# Patient Record
Sex: Female | Born: 1985 | Race: White | Hispanic: No | Marital: Married | Smoking: Never smoker
Health system: Southern US, Community
[De-identification: ages and names within clinical notes are randomized; demographics above are authoritative.]

## PROBLEM LIST (undated history)

## (undated) DIAGNOSIS — Z3480 Encounter for supervision of other normal pregnancy, unspecified trimester: Secondary | ICD-10-CM

## (undated) DIAGNOSIS — Z803 Family history of malignant neoplasm of breast: Secondary | ICD-10-CM

## (undated) DIAGNOSIS — Z15068 Genetic susceptibility to other malignant neoplasm of digestive system: Secondary | ICD-10-CM

## (undated) DIAGNOSIS — Z1509 Genetic susceptibility to other malignant neoplasm: Secondary | ICD-10-CM

## (undated) DIAGNOSIS — Z1507 Genetic susceptibility to malignant neoplasm of urinary tract: Secondary | ICD-10-CM

## (undated) DIAGNOSIS — Z1501 Genetic susceptibility to malignant neoplasm of breast: Secondary | ICD-10-CM

## (undated) DIAGNOSIS — Z8041 Family history of malignant neoplasm of ovary: Secondary | ICD-10-CM

## (undated) HISTORY — DX: Family history of malignant neoplasm of ovary: Z80.41

## (undated) HISTORY — PX: WISDOM TOOTH EXTRACTION: SHX21

## (undated) HISTORY — DX: Family history of malignant neoplasm of breast: Z80.3

## (undated) HISTORY — DX: Genetic susceptibility to malignant neoplasm of breast: Z15.09

## (undated) HISTORY — DX: Genetic susceptibility to malignant neoplasm of urinary tract: Z15.07

## (undated) HISTORY — DX: Genetic susceptibility to other malignant neoplasm of digestive system: Z15.068

## (undated) HISTORY — DX: Encounter for supervision of other normal pregnancy, unspecified trimester: Z34.80

## (undated) HISTORY — DX: Genetic susceptibility to malignant neoplasm of breast: Z15.01

---

## 2015-07-23 ENCOUNTER — Ambulatory Visit (INDEPENDENT_AMBULATORY_CARE_PROVIDER_SITE_OTHER): Payer: BC Managed Care – PPO | Admitting: Nurse Practitioner

## 2015-07-23 ENCOUNTER — Encounter: Payer: Self-pay | Admitting: Nurse Practitioner

## 2015-07-23 VITALS — BP 126/80 | HR 88 | Ht 68.0 in | Wt 124.0 lb

## 2015-07-23 DIAGNOSIS — Z803 Family history of malignant neoplasm of breast: Secondary | ICD-10-CM

## 2015-07-23 DIAGNOSIS — Z01419 Encounter for gynecological examination (general) (routine) without abnormal findings: Secondary | ICD-10-CM

## 2015-07-23 DIAGNOSIS — Z0000 Encounter for general adult medical examination without abnormal findings: Secondary | ICD-10-CM | POA: Diagnosis not present

## 2015-07-23 NOTE — Progress Notes (Signed)
Patient ID: Dominique Cannon, female   DOB: 23-Jun-1986, 29 y.o.   MRN: [id number] 29 y.o. G0P0 Married  Caucasian Fe here for [employer] annual exam.  Former pt. here and paper is available. Mirena IUD was inserted 07/04/2011 by Dr. Sabra Heck.  Menses is monthly light to spotting at 3-4 days. Some cramps and PMS.  Married now for  3 years.  wants to consider a pregnancy in 3- 4 more years.  Wants another IUD after this one.  Patient's last menstrual period was 07/20/2015 (exact date).          Sexually active: Yes.    The current method of family planning is IUD.   Mirena inserted 07/04/11. Exercising: Yes.    yoga and walking Smoker:  no  Health Maintenance: Pap:  06/22/11, Negative  TDaP:  2011 Gardasil:  Completed 12/2011 Labs: declines  Urine: does not need   reports that she has never smoked. She has never used smokeless tobacco. She reports that she drinks about 0.6 oz of alcohol per week. She reports that she does not use illicit drugs.  Past Medical History  Diagnosis Date  . Family history of breast cancer in mother     mother age 44 died of breast cancer    Past Surgical History  Procedure Laterality Date  . Wisdom tooth extraction    . Intrauterine device insertion  07/04/11    Mirena IUD insertion by Dr. Edwinna Areola    Current Outpatient Prescriptions  Medication Sig Dispense Refill  . levonorgestrel (MIRENA) 20 MCG/24HR IUD 1 each by Intrauterine route once.     No current facility-administered medications for this visit.    Family History  Problem Relation Age of Onset  . Breast cancer Mother 71  . Breast cancer Maternal Grandmother   . Breast cancer Paternal Grandmother     ROS:  Pertinent items are noted in HPI.  Otherwise, a comprehensive ROS was negative.  Exam:   BP 126/80 mmHg  Pulse 88  Ht _0  (1.727 m)  Wt 124 lb (56.246 kg)  BMI 18.86 kg/m2  LMP 07/20/2015 (Exact Date) Height: _1  (172.7 cm) Ht Readings from Last 3 Encounters:  07/23/15 _2  (1.727 m)     General appearance: alert, cooperative and appears stated age Head: Normocephalic, without obvious abnormality, atraumatic Neck: no adenopathy, supple, symmetrical, trachea midline and thyroid normal to inspection and palpation Lungs: clear to auscultation bilaterally Breasts: normal appearance, no masses or tenderness Heart: regular rate and rhythm Abdomen: soft, non-tender; no masses,  no organomegaly Extremities: extremities normal, atraumatic, no cyanosis or edema Skin: Skin color, texture, turgor normal. No rashes or lesions Lymph nodes: Cervical, supraclavicular, and axillary nodes normal. No abnormal inguinal nodes palpated Neurologic: Grossly normal   Pelvic: External genitalia:  no lesions              Urethra:  normal appearing urethra with no masses, tenderness or lesions              Bartholin's and Skene's: normal                 Vagina: normal appearing vagina with normal color and moderate vaginal bleeding, no lesions              Cervix: anteverted  IUD strings are visible              Pap taken: Yes.   Bimanual Exam:  Uterus:  normal size, contour, position, consistency, mobility, non-tender  Adnexa: no mass, fullness, tenderness               Rectovaginal: Confirms               Anus:  normal sphincter tone, no lesions  Chaperone present: yes  A:  Well Woman with normal exam  Mirena IUD for contraception - insertion 07/04/11  [place]: mother with breast cancer and passed at age 68  P:   Reviewed health and wellness pertinent to exam  Pap smear as above  Due for change of Mirena IUD in 1 year and wants a replacement and will need Cytotec.  She will call 4-6 weeks before replacement and get scheduled  Information given about BRCA and Myriad labs.  She will call them about any questions of insurance coverage.  Counseled on breast self exam, STD prevention, adequate intake of calcium and vitamin D, diet and exercise return annually or prn  An After  Visit Summary was printed and given to the patient.

## 2015-07-23 NOTE — Progress Notes (Signed)
I would consider beginning yearly mammograms for this patient due to her family history.  I recommend genetic counseling.

## 2015-07-23 NOTE — Patient Instructions (Signed)
General topics  Next pap or exam is  due in 1 year Take a Women's multivitamin Take 1200 mg. of calcium daily - prefer dietary If any concerns in interim to call back  Breast Self-Awareness Practicing breast self-awareness may pick up problems early, prevent significant medical complications, and possibly save your life. By practicing breast self-awareness, you can become familiar with how your breasts look and feel and if your breasts are changing. This allows you to notice changes early. It can also offer you some reassurance that your breast health is good. One way to learn what is normal for your breasts and whether your breasts are changing is to do a breast self-exam. If you find a lump or something that was not present in the past, it is best to contact your caregiver right away. Other findings that should be evaluated by your caregiver include nipple discharge, especially if it is bloody; skin changes or reddening; areas where the skin seems to be pulled in (retracted); or new lumps and bumps. Breast pain is seldom associated with cancer (malignancy), but should also be evaluated by a caregiver. BREAST SELF-EXAM The best time to examine your breasts is 5 7 days after your menstrual period is over.  ExitCare Patient Information 2013 [employer]Care[employer] LLC.   Exercise to Stay Healthy Exercise helps you become and stay healthy. EXERCISE IDEAS AND TIPS Choose exercises that:  You enjoy.  Fit into your day. You do not need to exercise really hard to be healthy. You can do exercises at a slow or medium level and stay healthy. You can:  Stretch before and after working out.  Try yoga, Pilates, or tai chi.  Lift weights.  Walk fast, swim, jog, run, climb stairs, bicycle, dance, or rollerskate.  Take aerobic classes. Exercises that burn about 150 calories:  Running 1  miles in 15 minutes.  Playing volleyball for 45 to 60 minutes.  Washing and waxing a car for 45 to 60  minutes.  Playing touch football for 45 minutes.  Walking 1  miles in 35 minutes.  Pushing a stroller 1  miles in 30 minutes.  Playing basketball for 30 minutes.  Raking leaves for 30 minutes.  Bicycling 5 miles in 30 minutes.  Walking 2 miles in 30 minutes.  Dancing for 30 minutes.  Shoveling snow for 15 minutes.  Swimming laps for 20 minutes.  Walking up stairs for 15 minutes.  Bicycling 4 miles in 15 minutes.  Gardening for 30 to 45 minutes.  Jumping rope for 15 minutes.  Washing windows or floors for 45 to 60 minutes. Document Released: 12/02/2010 Document Revised: 01/22/2012 Document Reviewed: 12/02/2010 ExitCare Patient Information 2013 ExitCare[employer].   Other topics ( that may be useful information):    Sexually Transmitted Disease Sexually transmitted disease (STD) refers to any infection that is passed from person to person during sexual activity. This may happen by way of saliva, semen, blood, vaginal mucus, or urine. Common STDs include:  Gonorrhea.  Chlamydia.  Syphilis.  HIV/AIDS.  Genital herpes.  Hepatitis B and C.  Trichomonas.  Human papillomavirus (HPV).  Pubic lice. CAUSES  An STD may be spread by bacteria, virus, or parasite. A person can get an STD by:  Sexual intercourse with an infected person.  Sharing sex toys with an infected person.  Sharing needles with an infected person.  Having intimate contact with the genitals, mouth, or rectal areas of an infected person. SYMPTOMS  Some people may not have any symptoms, but   they can still pass the infection to others. Different STDs have different symptoms. Symptoms include:  Painful or bloody urination.  Pain in the pelvis, abdomen, vagina, anus, throat, or eyes.  Skin rash, itching, irritation, growths, or sores (lesions). These usually occur in the genital or anal area.  Abnormal vaginal discharge.  Penile discharge in men.  Soft, flesh-colored skin growths in the  genital or anal area.  Fever.  Pain or bleeding during sexual intercourse.  Swollen glands in the groin area.  Yellow skin and eyes (jaundice). This is seen with hepatitis. DIAGNOSIS  To make a diagnosis, your caregiver may:  Take a medical history.  Perform a physical exam.  Take a specimen (culture) to be examined.  Examine a sample of discharge under a microscope.  Perform blood test TREATMENT   Chlamydia, gonorrhea, trichomonas, and syphilis can be cured with antibiotic medicine.  Genital herpes, hepatitis, and HIV can be treated, but not cured, with prescribed medicines. The medicines will lessen the symptoms.  Genital warts from HPV can be treated with medicine or by freezing, burning (electrocautery), or surgery. Warts may come back.  HPV is a virus and cannot be cured with medicine or surgery.However, abnormal areas may be followed very closely by your caregiver and may be removed from the cervix, vagina, or vulva through office procedures or surgery. If your diagnosis is confirmed, your recent sexual partners need treatment. This is true even if they are symptom-free or have a negative culture or evaluation. They should not have sex until their caregiver says it is okay. HOME CARE INSTRUCTIONS  All sexual partners should be informed, tested, and treated for all STDs.  Take your antibiotics as directed. Finish them even if you start to feel better.  Only take over-the-counter or prescription medicines for pain, discomfort, or fever as directed by your caregiver.  Rest.  Eat a balanced diet and drink enough fluids to keep your urine clear or pale yellow.  Do not have sex until treatment is completed and you have followed up with your caregiver. STDs should be checked after treatment.  Keep all follow-up appointments, Pap tests, and blood tests as directed by your caregiver.  Only use latex condoms and water-soluble lubricants during sexual activity. Do not use  petroleum jelly or oils.  Avoid alcohol and illegal drugs.  Get vaccinated for HPV and hepatitis. If you have not received these vaccines in the past, talk to your caregiver about whether one or both might be right for you.  Avoid risky sex practices that can break the skin. The only way to avoid getting an STD is to avoid all sexual activity.Latex condoms and dental dams (for oral sex) will help lessen the risk of getting an STD, but will not completely eliminate the risk. SEEK MEDICAL CARE IF:   You have a fever.  You have any new or worsening symptoms. Document Released: 01/20/2003 Document Revised: 01/22/2012 Document Reviewed: 01/27/2011 [facility] Patient Information 2013 [employer].    Domestic Abuse You are being battered or abused if someone close to you hits, pushes, or physically hurts you in any way. You also are being abused if you are forced into activities. You are being sexually abused if you are forced to have sexual contact of any kind. You are being emotionally abused if you are made to feel worthless or if you are constantly threatened. It is important to remember that help is available. No one has the right to abuse you. PREVENTION OF FURTHER  ABUSE  Learn the warning signs of danger. This varies with situations but may include: the use of alcohol, threats, isolation from friends and family, or forced sexual contact. Leave if you feel that violence is going to occur.  If you are attacked or beaten, report it to the police so the abuse is documented. You do not have to press charges. The police can protect you while you or the attackers are leaving. Get the officer's name and badge number and a copy of the report.  Find someone you can trust and tell them what is happening to you: your caregiver, a nurse, clergy member, close friend or family member. Feeling ashamed is natural, but remember that you have done nothing wrong. No one deserves abuse. Document Released:  10/27/2000 Document Revised: 01/22/2012 Document Reviewed: 01/05/2011 ExitCare Patient Information 2013 ExitCare[employer] LLC.    How Much is Too Much Alcohol? Drinking too much alcohol can cause injury, accidents, and health problems. These types of problems can include:   Car crashes.  Falls.  Family fighting (domestic violence).  Drowning.  Fights.  Injuries.  Burns.  Damage to certain organs.  Having a baby with birth defects. ONE DRINK CAN BE TOO MUCH WHEN YOU ARE:  Working.  Pregnant or breastfeeding.  Taking medicines. Ask your doctor.  Driving or planning to drive. If you or someone you know has a drinking problem, get help from a doctor.  Document Released: 08/26/2009 Document Revised: 01/22/2012 Document Reviewed: 08/26/2009 ExitCare Patient Information 2013 ExitCare[employer] LLC.   Smoking Hazards Smoking cigarettes is extremely bad for your health. Tobacco smoke has over 200 known poisons in it. There are over 60 chemicals in tobacco smoke that cause cancer. Some of the chemicals found in cigarette smoke include:   Cyanide.  Benzene.  Formaldehyde.  Methanol (wood alcohol).  Acetylene (fuel used in welding torches).  Ammonia. Cigarette smoke also contains the poisonous gases nitrogen oxide and carbon monoxide.  Cigarette smokers have an increased risk of many serious medical problems and Smoking causes approximately:  90% of all lung cancer deaths in men.  80% of all lung cancer deaths in women.  90% of deaths from chronic obstructive lung disease. Compared with nonsmokers, smoking increases the risk of:  Coronary heart disease by 2 to 4 times.  Stroke by 2 to 4 times.  Men developing lung cancer by 23 times.  Women developing lung cancer by 13 times.  Dying from chronic obstructive lung diseases by 12 times.  . Smoking is the most preventable cause of death and disease in our society.  WHY IS SMOKING ADDICTIVE?  Nicotine is the chemical  agent in tobacco that is capable of causing addiction or dependence.  When you smoke and inhale, nicotine is absorbed rapidly into the bloodstream through your lungs. Nicotine absorbed through the lungs is capable of creating a powerful addiction. Both inhaled and non-inhaled nicotine may be addictive.  Addiction studies of cigarettes and spit tobacco show that addiction to nicotine occurs mainly during the teen years, when young people begin using tobacco products. WHAT ARE THE BENEFITS OF QUITTING?  There are many health benefits to quitting smoking.   Likelihood of developing cancer and heart disease decreases. Health improvements are seen almost immediately.  Blood pressure, pulse rate, and breathing patterns start returning to normal soon after quitting. QUITTING SMOKING   American Lung Association - 1-800-LUNGUSA  American Cancer Society - 1-800-ACS-2345 Document Released: 12/07/2004 Document Revised: 01/22/2012 Document Reviewed: 08/11/2009 ExitCare Patient Information 2013 ExitCare,   LLC.   Stress Management Stress is a state of physical or mental tension that often results from changes in your life or normal routine. Some common causes of stress are:  Death of a loved one.  Injuries or severe illnesses.  Getting fired or changing jobs.  Moving into a new home. Other causes may be:  Sexual problems.  Business or financial losses.  Taking on a large debt.  Regular conflict with someone at home or at work.  Constant tiredness from lack of sleep. It is not just bad things that are stressful. It may be stressful to:  Win the lottery.  Get married.  Buy a new car. The amount of stress that can be easily tolerated varies from person to person. Changes generally cause stress, regardless of the types of change. Too much stress can affect your health. It may lead to physical or emotional problems. Too little stress (boredom) may also become stressful. SUGGESTIONS TO  REDUCE STRESS:  Talk things over with your family and friends. It often is helpful to share your concerns and worries. If you feel your problem is serious, you may want to get help from a professional counselor.  Consider your problems one at a time instead of lumping them all together. Trying to take care of everything at once may seem impossible. List all the things you need to do and then start with the most important one. Set a goal to accomplish 2 or 3 things each day. If you expect to do too many in a single day you will naturally fail, causing you to feel even more stressed.  Do not use alcohol or drugs to relieve stress. Although you may feel better for a short time, they do not remove the problems that caused the stress. They can also be habit forming.  Exercise regularly - at least 3 times per week. Physical exercise can help to relieve that "uptight" feeling and will relax you.  The shortest distance between despair and hope is often a good night's sleep.  Go to bed and get up on time allowing yourself time for appointments without being rushed.  Take a short "time-out" period from any stressful situation that occurs during the day. Close your eyes and take some deep breaths. Starting with the muscles in your face, tense them, hold it for a few seconds, then relax. Repeat this with the muscles in your neck, shoulders, hand, stomach, back and legs.  Take good care of yourself. Eat a balanced diet and get plenty of rest.  Schedule time for having fun. Take a break from your daily routine to relax. HOME CARE INSTRUCTIONS   Call if you feel overwhelmed by your problems and feel you can no longer manage them on your own.  Return immediately if you feel like hurting yourself or someone else. Document Released: 04/25/2001 Document Revised: 01/22/2012 Document Reviewed: 12/16/2007 ExitCare Patient Information 2013 ExitCare, LLC.  

## 2015-07-26 LAB — IPS PAP TEST WITH REFLEX TO HPV

## 2015-07-27 ENCOUNTER — Telehealth: Payer: Self-pay | Admitting: Emergency Medicine

## 2015-07-27 DIAGNOSIS — Z1239 Encounter for other screening for malignant neoplasm of breast: Secondary | ICD-10-CM

## 2015-07-27 DIAGNOSIS — Z803 Family history of malignant neoplasm of breast: Secondary | ICD-10-CM

## 2015-07-27 NOTE — Telephone Encounter (Signed)
Returned call

## 2015-07-27 NOTE — Telephone Encounter (Signed)
Spoke with patient and message from providers given.  Patient agreeable.  Advised will assist with scheduling appointments, patient preference, Mondays late afternoon if possible.   Called breast Center, appointment for screening mammogram scheduled for 08/09/15 at 1650. Arrive at 1650 for 1710 appointment.

## 2015-07-27 NOTE — Telephone Encounter (Addendum)
-----  Message from Kem Boroughs, [place] sent at 07/26/2015  2:32 PM EDT ----- Regarding: FW: I recommend genetic counseling and starting yearly mammograms. Please call pt. And tell her we have decided to start her Mammo now instead of age 29.  See if she has made a contact about BRCA - she really needs this as well.

## 2015-07-27 NOTE — Telephone Encounter (Signed)
Message left to return call to [place] at [phone].   Orders for screening mammogram and genetics referral pended.

## 2015-07-28 NOTE — Telephone Encounter (Signed)
Left detailed message with mammogram appointment.   Left message on voicemail for genetics counseling appointment.

## 2015-08-02 NOTE — Telephone Encounter (Signed)
Patient has Genetics counselor appointment scheduled for 09/01/15 at 1500.  Routing to provider for final review. Patient agreeable to disposition. Will close encounter.

## 2015-08-09 ENCOUNTER — Ambulatory Visit
Admission: RE | Admit: 2015-08-09 | Discharge: 2015-08-09 | Disposition: A | Discharge status: Home / Self Care | Payer: BC Managed Care – PPO | Source: Ambulatory Visit | Attending: Nurse Practitioner | Admitting: Nurse Practitioner

## 2015-08-09 DIAGNOSIS — Z1239 Encounter for other screening for malignant neoplasm of breast: Secondary | ICD-10-CM

## 2015-08-17 ENCOUNTER — Telehealth: Payer: Self-pay | Admitting: Emergency Medicine

## 2015-08-17 DIAGNOSIS — Z1239 Encounter for other screening for malignant neoplasm of breast: Secondary | ICD-10-CM

## 2015-08-17 DIAGNOSIS — R922 Inconclusive mammogram: Secondary | ICD-10-CM

## 2015-08-17 DIAGNOSIS — Z9189 Other specified personal risk factors, not elsewhere classified: Secondary | ICD-10-CM

## 2015-08-17 NOTE — Telephone Encounter (Signed)
-----   Message from Verner Chol, CNM sent at 08/12/2015 12:57 PM EDT ----- Reviewed mammogram negative, but extremely dense breast. Recommendation is for yearly mammogram and  MRI annually. Will need to work on scheduling this.

## 2015-08-17 NOTE — Telephone Encounter (Signed)
Patient returned call. She is given message from provider and agrees recommendation. However, has questions about coverage of MRI. Advised can discuss with [facility] as every plan is different. Patient agreeable.  Order faxed to [facility].  Routing to provider for final review. Patient agreeable to disposition. Will close encounter.

## 2015-08-17 NOTE — Telephone Encounter (Signed)
Order placed for MRI bilateral breast with and without contrast.   Message left to return call to [place] at [phone].

## 2015-08-23 NOTE — Telephone Encounter (Signed)
Patient called to say she is going to delay having her MRI done at [facility] due to cost. If we have any questions we can reach her on her cell [phone].

## 2015-08-25 NOTE — Telephone Encounter (Signed)
MRI cost to patient will be $1400.00. She has cancelled the appointment. Encounter has been closed. Message to update to Dr. Edward JollySilva and Ria CommentPatricia Grubb, FNP.

## 2015-09-01 ENCOUNTER — Encounter: Payer: Self-pay | Admitting: Genetic Counselor

## 2015-09-01 ENCOUNTER — Other Ambulatory Visit: Payer: BC Managed Care – PPO

## 2015-09-01 ENCOUNTER — Ambulatory Visit (HOSPITAL_BASED_OUTPATIENT_CLINIC_OR_DEPARTMENT_OTHER): Payer: BC Managed Care – PPO | Admitting: Genetic Counselor

## 2015-09-01 DIAGNOSIS — Z8041 Family history of malignant neoplasm of ovary: Secondary | ICD-10-CM | POA: Diagnosis not present

## 2015-09-01 DIAGNOSIS — Z315 Encounter for genetic counseling: Secondary | ICD-10-CM | POA: Diagnosis not present

## 2015-09-01 DIAGNOSIS — Z803 Family history of malignant neoplasm of breast: Secondary | ICD-10-CM

## 2015-09-01 NOTE — Progress Notes (Addendum)
REFERRING PROVIDER: Hedwig Morton, FNP  PRIMARY PROVIDER:  No PCP Per Patient  PRIMARY REASON FOR VISIT:  1. Family history of breast cancer   2. Family history of ovarian cancer      HISTORY OF PRESENT ILLNESS:   Dominique Cannon, a 29 y.o. female, was seen for a [place] cancer genetics consultation at the request of Hedwig Morton, FNP due to a family history of cancer.  Dominique Cannon presents to clinic today to discuss the possibility of a hereditary predisposition to cancer, genetic testing, and to further clarify her future cancer risks, as well as potential cancer risks for family members.  Dominique Cannon is a 30 y.o. female with no personal history of cancer.    CANCER HISTORY:   No history exists.     HORMONAL RISK FACTORS:  Menarche was at age 37.  First live birth at age N/A.  OCP use for approximately 0 years.  Ovaries intact: yes.  Hysterectomy: no.  Menopausal status: premenopausal.  HRT use: 0 years. Colonoscopy: no; not examined. Mammogram within the last year: yes. Number of breast biopsies: 0. Up to date with pelvic exams:  yes. Any excessive radiation exposure in the past:  no  Past Medical History  Diagnosis Date  . Family history of breast cancer in mother     mother age 77 died of breast cancer  . Family history of breast cancer   . Family history of ovarian cancer     Past Surgical History  Procedure Laterality Date  . Wisdom tooth extraction    . Intrauterine device insertion  07/04/11    Mirena IUD insertion by Dr. Edwinna Areola    Social History   Social History  . Marital Status: Married    Spouse Name: N/A  . Number of Children: N/A  . Years of Education: N/A   Social History Main Topics  . Smoking status: Never Smoker   . Smokeless tobacco: Never Used  . Alcohol Use: 0.6 oz/week    1 Standard drinks or equivalent per week  . Drug Use: No  . Sexual Activity:    Partners: Male    Birth Control/ Protection: IUD     Comment: Mirena inserted 07/04/11    Other Topics Concern  . None   Social History Narrative     FAMILY HISTORY:  We obtained a detailed, 4-generation family history.  Significant diagnoses are listed below: Family History  Problem Relation Age of Onset  . Breast cancer Mother 24  . Ovarian cancer Maternal Grandmother     dx in her 26s  . Breast cancer Paternal Grandmother   . Cancer Maternal Uncle     NOS  . Other Other     positive genetic testing for "the gene"  . Breast cancer Other     MGF's sister  . Breast cancer Other     MGF's mother   The patient is an only child and does not have children.  Her mother was diagnosed with breast cancer at age 30-31 and died at 83.  She had one brother and one sister, the brother had some form of cancer in the last year or so but is now in remission.  Her maternal grandmother was daignosed with ovarian cancer in her 59s, and her maternal grandfather is alive and well.  He has two sisters, one who had breast cancer in her 66s-70s and the other who did not have cancer but underwent genetic testing and has tested positive for "  the gene".  His mother was also diagnosed with breast cancer and died.  Her father is alive but they are estranged and she does not know much information.  He was the youngest of 8 children, she is not aware of cancer in his siblings or nieces and nephews.  Her paternal grandmother was diagnosed with possibly breast cancer and died.  Patient's maternal ancestors are of [place] descent, and paternal ancestors are of [place] descent. There is no reported Ashkenazi Jewish ancestry. There is no known consanguinity.  GENETIC COUNSELING ASSESSMENT: Dominique Cannon is a 28 y.o. female with a family history of breast and ovarian cancer, and a great aunt who has tested positive for 'the gene" which somewhat suggestive of a hereditary breast cancer syndrome and predisposition to cancer. We, therefore, discussed and recommended the following at today's visit.    DISCUSSION: We discussed that about 5-10% of breast cancer is the result of hereditary cancer syndromes. Of this, the majority is the result of BRCA mutations.  We discussed that having her great aunt's test result would allow [place] to test for the specific mutation that is running in her grandfather's family, however, if her grandmother had ovarian cancer because of a hereditary condition, we would not capture that risk.  Therefore we should consider a larger panel, with the understanding that we need to obtain a copy of Dominique Cannon great aunt's test result to confirm that Dominique Cannon is testing for the gene that her aunt tested positive for.    Based on her great aunt's positive test, Dominique Cannon has a 12-13% chance of testing positive as well.  This is not taking into consideration of her grandmother's side of the family.  We reviewed the characteristics, features and inheritance patterns of hereditary cancer syndromes. We also discussed genetic testing, including the appropriate family members to test, the process of testing, insurance coverage and turn-around-time for results. We discussed the implications of a negative, positive and/or variant of uncertain significant result. We recommended Dominique Cannon pursue genetic testing for the Breast/Ovarian gene panel. The Breast/Ovarian gene panel offered by [facility] includes sequencing and rearrangement analysis for the following 20 genes:  ATM, [name]1, BRCA1, BRCA2, BRIP1, CDH1, CHEK2, EPCAM, FANCC, MLH1, MSH2, MSH6, NBN, PALB2, PMS2, PTEN, RAD51C, RAD51D, TP53, and XRCC2.     Based on Dominique Cannon family history of cancer, she meets medical criteria for genetic testing. Despite that she meets criteria, she may still have an out of pocket cost. We discussed that if her out of pocket cost for testing is over $100, the laboratory will call and confirm whether she wants to proceed with testing.  If the out of pocket cost of testing is less than $100 she will be billed by the  genetic testing laboratory.   In order to estimate her chance of having a BRCA mutation, we used statistical models (Penn II, Myriad Risk Calculator and [employer]) and laboratory data that take into account her personal medical history, family history and ancestry.  Because each model is different, there can be a lot of variability in the risks they give.  Therefore, these numbers must be considered a rough range and not a precise risk of having a BRCA mutation.  These models estimate that she has approximately a 7.2-12% chance of having a mutation. Based on this assessment of her family and personal history, genetic testing is recommended.  Based on the patient's personal and family history, statistical models (Tyrer Cusik)  and literature data were  used to estimate her risk of developing breast cancer. These estimate her lifetime risk of developing breast cancer to be approximately 33.4%. This estimation does not take into account any genetic testing results.  The patient's lifetime breast cancer risk is a preliminary estimate based on available information using one of several models endorsed by the [place] (ACS). The ACS recommends consideration of breast MRI screening as an adjunct to mammography for patients at high risk (defined as 20% or greater lifetime risk). A more detailed breast cancer risk assessment can be considered, if clinically indicated.   Ms. Cline has been determined to be at high risk for breast cancer.  Therefore, we recommend that annual screening with mammography and breast MRI begin at age 68, or 10 years prior to the age of breast cancer diagnosis in a relative (whichever is earlier).  We discussed that Dominique Cannon should discuss her individual situation with her referring physician and determine a breast cancer screening plan with which they are both comfortable.    PLAN: After considering the risks, benefits, and limitations, Dominique Cannon  provided informed consent to  pursue genetic testing and the blood sample was sent to [facility] for analysis of the Breast/Ovarian cancer panel. Results should be available within approximately 2-3 weeks' time, at which point they will be disclosed by telephone to Dominique Cannon, as will any additional recommendations warranted by these results. Dominique Cannon will receive a summary of her genetic counseling visit and a copy of her results once available. This information will also be available in Epic. We encouraged Dominique Cannon to remain in contact with cancer genetics annually so that we can continuously update the family history and inform her of any changes in cancer genetics and testing that may be of benefit for her family. Dominique Cannon questions were answered to her satisfaction today. Our contact information was provided should additional questions or concerns arise.  Lastly, we encouraged Dominique Cannon to remain in contact with cancer genetics annually so that we can continuously update the family history and inform her of any changes in cancer genetics and testing that may be of benefit for this family.   Ms.  Cannon questions were answered to her satisfaction today. Our contact information was provided should additional questions or concerns arise. Thank you for the referral and allowing [place] to share in the care of your patient.   Sorina Derrig P. Florene Glen, [place], [facility] Certified Genetic Counselor [email or web address] phone: [phone]  The patient was seen for a total of 50 minutes in face-to-face genetic counseling.  This patient was discussed with Drs. Magrinat, Lindi Adie and/or Burr Medico who agrees with the above.    _______________________________________________________________________ For Office Staff:  Number of people involved in session: 2 Was an Intern/ student involved with case: no

## 2015-09-03 ENCOUNTER — Other Ambulatory Visit: Payer: BC Managed Care – PPO

## 2015-09-09 ENCOUNTER — Telehealth: Payer: Self-pay | Admitting: Emergency Medicine

## 2015-09-09 NOTE — Telephone Encounter (Signed)
Out of hold. MRI order DC.  Will close encounter.

## 2015-09-09 NOTE — Telephone Encounter (Signed)
Ok to remove from imaging hold and cancel breast MRI.  Thank you.   Cc- Shirlyn GoltzPatty Grubb

## 2015-09-09 NOTE — Telephone Encounter (Signed)
Dr. Edward JollySilva,  This a patient of Ria CommentPatricia Grubb, FNP. She completed genetics referral and genetics testing is pending.  Our office has ordered a Breast MRI for her but she has cancelled due to cost.   Romero Lineryrer Cusik model: These estimate her lifetime risk of developing breast cancer to be approximately 33.4%. Okay to remove from imaging hold for Breast MRI?

## 2015-09-27 ENCOUNTER — Encounter: Payer: Self-pay | Admitting: Genetic Counselor

## 2015-09-27 ENCOUNTER — Telehealth: Payer: Self-pay | Admitting: Genetic Counselor

## 2015-09-27 DIAGNOSIS — Z1509 Genetic susceptibility to other malignant neoplasm: Secondary | ICD-10-CM

## 2015-09-27 DIAGNOSIS — Z1379 Encounter for other screening for genetic and chromosomal anomalies: Secondary | ICD-10-CM | POA: Insufficient documentation

## 2015-09-27 DIAGNOSIS — Z1501 Genetic susceptibility to malignant neoplasm of breast: Secondary | ICD-10-CM | POA: Insufficient documentation

## 2015-09-27 NOTE — Telephone Encounter (Signed)
LM on VM that results were back and to please call.  Left CB number. 

## 2015-09-28 ENCOUNTER — Telehealth: Payer: Self-pay | Admitting: Genetic Counselor

## 2015-09-28 NOTE — Telephone Encounter (Signed)
LM on VM that I was calling with results.  Asked her to CB at her convenience, and if she gets my VM to please leave a time that would be convenient for me to call.  Left CB instructions.

## 2015-09-29 ENCOUNTER — Telehealth: Payer: Self-pay | Admitting: Genetic Counselor

## 2015-09-29 NOTE — Telephone Encounter (Signed)
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  Discussed that we would like to bring her back in to discuss this further, as well as schedule her in the high risk clinic for at least one visit.  She confirmed that she has had a mammogram in October, and because of her breast density it was requested that she have a breast MRI.  I confirmed that this recommendation is consistent with that of BRCA2+ individuals.  We scheduled a return visit for Dec 7 at 3 PM.

## 2015-10-01 ENCOUNTER — Other Ambulatory Visit: Payer: Self-pay | Admitting: Obstetrics & Gynecology

## 2015-10-01 DIAGNOSIS — Z1509 Genetic susceptibility to other malignant neoplasm: Principal | ICD-10-CM

## 2015-10-01 DIAGNOSIS — Z1501 Genetic susceptibility to malignant neoplasm of breast: Secondary | ICD-10-CM

## 2015-10-05 ENCOUNTER — Telehealth: Payer: Self-pay | Admitting: Oncology

## 2015-10-05 ENCOUNTER — Telehealth: Payer: Self-pay | Admitting: Nurse Practitioner

## 2015-10-05 DIAGNOSIS — Z803 Family history of malignant neoplasm of breast: Secondary | ICD-10-CM

## 2015-10-05 DIAGNOSIS — R922 Inconclusive mammogram: Secondary | ICD-10-CM

## 2015-10-05 NOTE — Telephone Encounter (Signed)
Patient calling requesting another breast MRI [facility] order for a previously cancelled appointment. The patient says they need a new order for her to reschedule.

## 2015-10-05 NOTE — Telephone Encounter (Signed)
New order placed for bilateral breast MRI w and without contrast. Patient has a family history of breast cancer and dense breast tissue. MRI was recommended by Dominique Cannon CNM on 08/17/2015 please result and telephone note. Spoke with patient. Notified order has been placed. Patient will call to schedule breast MRI at this time.  Routing to provider for final review. Patient agreeable to disposition. Will close encounter.

## 2015-10-05 NOTE — Telephone Encounter (Signed)
New High risk -s/w patient and gave appt for 12/07 @ 4:30 w/Dr. Darnelle CatalanMagrinat

## 2015-10-06 ENCOUNTER — Encounter: Payer: BC Managed Care – PPO | Admitting: Genetic Counselor

## 2015-10-14 ENCOUNTER — Telehealth: Payer: Self-pay | Admitting: Certified Nurse Midwife

## 2015-10-14 NOTE — Telephone Encounter (Signed)
Called patient to discuss referral for bilateral breast MRI. Left voicemail for return call.  After discussing with Almedia Ballsracy Fast, RN, best to discuss patient intentions regarding time frame for scheduling prior to initiating pre-certifcation process with insurance. Authorizations expire after a certain period. Does patient intend to schedule prior to end of the year or pend until early 2017? Patient has upcoming appointment with Dr Russ HaloMagrinaut - does she wish to wait until after that appointment? Patient must have within 6 months of mammogram - by March 2017. Mammogram was 08/09/15. Order pended until patient returns call.

## 2015-10-20 ENCOUNTER — Ambulatory Visit (HOSPITAL_BASED_OUTPATIENT_CLINIC_OR_DEPARTMENT_OTHER): Payer: Self-pay | Admitting: Oncology

## 2015-10-20 ENCOUNTER — Encounter: Payer: BC Managed Care – PPO | Admitting: Genetic Counselor

## 2015-10-20 ENCOUNTER — Ambulatory Visit (HOSPITAL_BASED_OUTPATIENT_CLINIC_OR_DEPARTMENT_OTHER): Payer: BC Managed Care – PPO | Admitting: Genetic Counselor

## 2015-10-20 ENCOUNTER — Encounter: Payer: Self-pay | Admitting: Genetic Counselor

## 2015-10-20 VITALS — BP 119/83 | HR 91 | Temp 98.7°F | Resp 20 | Ht 68.0 in | Wt 125.4 lb

## 2015-10-20 DIAGNOSIS — Z315 Encounter for genetic counseling: Secondary | ICD-10-CM | POA: Diagnosis not present

## 2015-10-20 DIAGNOSIS — Z8041 Family history of malignant neoplasm of ovary: Secondary | ICD-10-CM | POA: Diagnosis not present

## 2015-10-20 DIAGNOSIS — Z1509 Genetic susceptibility to other malignant neoplasm: Principal | ICD-10-CM

## 2015-10-20 DIAGNOSIS — Z803 Family history of malignant neoplasm of breast: Secondary | ICD-10-CM

## 2015-10-20 DIAGNOSIS — Z1501 Genetic susceptibility to malignant neoplasm of breast: Secondary | ICD-10-CM | POA: Diagnosis not present

## 2015-10-20 DIAGNOSIS — Z1379 Encounter for other screening for genetic and chromosomal anomalies: Secondary | ICD-10-CM

## 2015-10-20 NOTE — Progress Notes (Signed)
[place]  Telephone:([phone]) [phone] Fax:(336) [phone]     ID: Dominique Cannon DOB: 1985/12/15  MR#: [id number]  EZM#:[id number]  Patient Care Team: No Pcp Per Patient as PCP - General (General Practice) Nunzio Cobbs, MD as Consulting Physician (Obstetrics and Gynecology) Chauncey Cruel, MD as Consulting Physician (Oncology) Irene Limbo, MD as Consulting Physician (Plastic Surgery) PCP: No PCP Per Patient SU:  OTHER MD:  CHIEF COMPLAINT: BRCA 2 positivity  CURRENT TREATMENT: Observation   HISTORY OF PRESENT ILLNESS: Dominique Cannon has a significant family history suggesting a hereditary breast cancer syndrome and her gynecologist [name] working with Dr. [name] referred her for genetic evaluation. 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 The other 19 genes tested were unremarkable.  Her subsequent history is as detailed below  INTERVAL HISTORY: Dominique Cannon was evaluated in the high risk clinic 10/20/2015 accompanied by her husband Dian Situ.  REVIEW OF SYSTEMS: A detailed review of systems today was entirely negative  PAST MEDICAL HISTORY: Past Medical History  Diagnosis Date  . Family history of breast cancer in mother     mother age 82 died of breast cancer  . Family history of breast cancer   . Family history of ovarian cancer   . BRCA2 positive     PAST SURGICAL HISTORY: Past Surgical History  Procedure Laterality Date  . Wisdom tooth extraction    . Intrauterine device insertion  07/04/11    Mirena IUD insertion by Dr. Edwinna Areola    FAMILY HISTORY Family History  Problem Relation Age of Onset  . Breast cancer Mother 62  . Ovarian cancer Maternal Grandmother     dx in her 74s  . Breast cancer Paternal Grandmother   . Cancer Maternal Uncle     NOS  . Other Other     positive genetic testing for "the gene"  . Breast cancer  Other     MGF's sister  . Breast cancer Other     MGF's mother   the patient's mother died at the age of 68 from breast cancer. The patient's mother had one sister, in good health. The patient's mother's mother has a history of ovarian cancer. One maternal great aunt also had a history of breast cancer. A second has tested positive for a gene mutation, but we do not have those records. On the paternal side there is also a history of breast cancer, likely postmenopausal  GYNECOLOGIC HISTORY:  No LMP recorded. Menarche age 62, the patient is GX P0. Her periods are regular. She uses a Mirena IUD for contraception.   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 Her husband Dian Situ is also a Art therapist, and band leader. They have no children but are hoping to have a family in the next few years.    ADVANCED DIRECTIVES: Not in place   HEALTH MAINTENANCE: Social History  Substance Use Topics  . Smoking status: Never Smoker   . Smokeless tobacco: Never Used  . Alcohol Use: 0.6 oz/week    1 Standard drinks or equivalent per week     Colonoscopy:  PAP:  Bone density:  Lipid panel:  No Known Allergies  Current Outpatient Prescriptions  Medication Sig Dispense Refill  . levonorgestrel (MIRENA) 20 MCG/24HR IUD 1 each by Intrauterine route once.     No current facility-administered medications for this visit.    OBJECTIVE: [name]  who appears well Filed Vitals:   10/20/15 1618  BP: 119/83  Pulse: 91  Temp: 98.7 F (37.1 C)  Resp: 20     Body mass index is 19.07 kg/(m^2).    ECOG FS:0 - Asymptomatic  Ocular: Sclerae unicteric, pupils equal, round and reactive to light Ear-nose-throat: Oropharynx clear and moist Lymphatic: No cervical or supraclavicular adenopathy Lungs no rales or rhonchi, good excursion bilaterally Heart regular rate and rhythm, no murmur appreciated Abd soft, nontender, positive bowel sounds MSK no focal spinal tenderness, no  joint edema Neuro: non-focal, well-oriented, appropriate affect Breasts: Neither breast shows any suspicious lumps. There are no skin or nipple changes of concern. Both axillae are benign.   LAB RESULTS:  CMP  No results found for: NA, K, CL, CO2, GLUCOSE, BUN, CREATININE, CALCIUM, PROT, ALBUMIN, AST, ALT, ALKPHOS, BILITOT, GFRNONAA, GFRAA  INo results found for: SPEP, UPEP  No results found for: WBC, NEUTROABS, HGB, HCT, MCV, PLT    Chemistry   No results found for: NA, K, CL, CO2, BUN, CREATININE, GLU No results found for: CALCIUM, ALKPHOS, AST, ALT, BILITOT     No results found for: LABCA2  No components found for: NOMVE720  No results for input(s): INR in the last 168 hours.  Urinalysis No results found for: COLORURINE, APPEARANCEUR, LABSPEC, PHURINE, GLUCOSEU, HGBUR, BILIRUBINUR, KETONESUR, PROTEINUR, UROBILINOGEN, NITRITE, LEUKOCYTESUR  STUDIES: No results found.  ASSESSMENT: 29 y.o. BRCA2 positive Dominique Cannon, Dominique Cannon  PLAN: I spent approximately one hour today with Dominique Cannon and her husband discussing her BRCA2 mutation.   It's important to remember that everyone carries mutations. Carrying mutations is a normal part of being a human being. The difference in someone like her is that she actually knows what one of those mutations is and therefore is in  a position to do something about it.  We discussed her specific BRCA2 mutation, which involves frame shifting. This creates a nonfunctioning protein. We discussed the fact that the function of the BRCA proteins is DNA repair, so it makes sense that a defect in these enzymes might result in a higher risk of DNA changes and so of cancer. With the BRCA2 mutations the major risk of course is with breast and ovarian cancer.  Taking these in order: the risk of breast cancer may be as high as 80% or perhaps as low as 40%. If we use a 60% risk as a baseline, this means she has a nearly 50% chance of not developing breast cancer in  her lifetime. She was very interested in risk reduction bilateral mastectomies and we discussed that at length today.  It is important to understand that intensified screening with yearly MRI in particular is safe. There is no survival advantage between that approach and bilateral mastectomy. My recommendation to her is to see a Psychiatric nurse and discuss the various possible ways of reconstruction , as well as see multiple images of what a reconstructed breast looks like before she makes a decision. In particular, if she is planning to have a family, she would not be able to breast-feed.   The situation with ovarian cancer is more complex. We don't have a well documented safe screening method for ovarian cancer. There is preliminary data that ultrasounds every 6 months, with CA 125 readings, if they are done strictly at the same time in the ovarian cycle, may be of help. She will be discussing this with Dr. [name].   The important point to remember though is that she has  an 80% chance of not developing ovarian cancer in her lifetime and therefore waiting until they have the family they want and probably until she is at least 29 years old is a standard recommendation. We discussed the fact that having children does not increase the risk of breast cancer, while breast feeding decreases it. We also discussed some of the consequences of early menopause regarding cardiac risk and osteoporosis in particular and also mentions some of the common symptoms of menopause.  At this point, with all this in mind, the plan will be for her to have an MRI later this month. She will meet with her gynecologist to discuss ovarian cancer screening in the setting. I will recommend replacing her Mirena IUD with a copper IUD. Dominique Cannon will also meet with a plastic surgeon to discuss the option of bilateral mastectomies and reconstruction.   She will see me again in June 2017.  At that time we will discuss whether raloxifene might  be an option she wishes to pursue.    Dominique Cannon has a good understanding of this plan. She agrees with it. She knows the goal in her case is prevention. She will call with any problems that may develop before next visit here.   Marland KitchenChauncey Cruel, MD   10/24/2015 12:19 PM Medical Oncology and Hematology [facility] [address] [place], [place] [address] Tel. [phone]    Fax. [phone]

## 2015-10-20 NOTE — Progress Notes (Addendum)
GENETIC TEST RESULTS   Patient Name: Dominique Cannon Patient Age: 29 y.o. 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 Please refer to the prior Genetics clinic note for more information regarding Dominique Cannon's medical and family histories and our assessment at the time.   GENETIC TESTING:  At the time of Dominique Cannon visit, we recommended she pursue genetic testing of the Breast/Ovarian cancer gene panel. The Breast/Ovarian gene panel offered by [facility] includes sequencing and rearrangement analysis for the following 20 genes:  ATM, [name]1, BRCA1, BRCA2, BRIP1, CDH1, CHEK2, EPCAM, FANCC, MLH1, MSH2, MSH6, NBN, PALB2, PMS2, PTEN, RAD51C, RAD51D, TP53, and XRCC2.   Testing revealed a mutation in the BRCA2 gene called [id number].  MEDICAL MANAGEMENT: Women who have a BRCA mutation have an increased risk for both breast and ovarian cancer.   As discussed with Dominique Cannon, to reduce the risk for breast cancer, prophylactic bilateral mastectomy is the most effective option. However, for women who choose to keep their breasts, we recommend yearly mammograms, yearly breast MRI, twice-yearly clinical breast exams through a high-risk clinic, and monthly self-breast exams.   To reduce the risk for ovarian cancer, we recommend Dominique Cannon  have a prophylactic bilateral salpingo-oophorectomy when childbearing is completed, if planned. We discussed that screening with CA-125 blood tests and transvaginal ultrasounds can be done twice per year. However, these tests have not been shown to detect ovarian cancer at an early stage.  We recommend that once family's are completed, or by age 29, that Dominique Cannon strongly consider a prophylactic bilateral salpingo-oophorectomy.  NCCN guidelines suggest that women who  have a BSO in their 30s-40s can be given hormone replacement until the natural age of menopause without a significant increase in risk for breast cancer.  RISK REDUCTION: There are several things that can be offered to individuals who are carriers for BRCA mutations that will reduce the risk for getting cancer.   The use of oral contraceptives can lower the risk for ovarian cancer, and, per case control studies, does not significantly increase the risk for breast cancer in BRCA patients. Case control studies have shown that oral contraceptives can lower the risk for ovarian cancer in women with BRCA mutations. Additionally, a more recent meta-analysis, including one corhort (n=3,181) and one case control study (1,096 cases and 2,878 controls) also showed an inverse correlation between ovarian cancer and ever having used oral contraceptives (OR, 0.58; 95% CI = 0.46-0.73). Studies on oral contraceptives and breast cancer have been conflicting, with some studies suggesting that there is not an increased risk for breast cancer in BRCA mutation carriers, while others suggest that there could be a risk. That said, two meta-analysis studies have shown that there is not an increased risk for breast cancer with oral contraceptive use in BRCA1 and BRCA2 carriers.   The use of tamoxifen can lower the risk for breast cancer.  We discussed some of the pros and cons for the use of tamoxifen, and I referred her to discuss this further with Dominique Cannon at her appointment with him later today.  In individuals who have a prophylactic bilateral salpino-oophorectomy (BSO), the risk for breast cancer is reduced by up to 50%. It has been reported that short term hormone replacement therapy in women undergoing prophylactic BSO  does not negate the reduction of breast cancer risk associated with surgery (2.2016 NCCN guidelines).  FAMILY MEMBERS: It is important that all of Dominique Cannon relatives (both men and women) know of  the presence of this gene mutation. Site-specific genetic testing can sort out who in the family is at risk and who is not. Dominique Cannon is an only child and does not have children.  We discussed that her maternal family members are at increased risk for also harboring this gene mutation.  She should consider alerting them of this finding in her and providing them a copy of her test result so that they can obtain testing as well.  We discussed pregnancy testing options with Dominique Cannon and her husband.  Some families choose to undergo preimplantation testing to determine if embryos are gene positive and only implant those that are gene negative.  We discussed the pros and cons of this option including cost, and what to do with embryos not implanted.  If they are wanting to get more information about this option I recommended that they contact a prenatal genetic counselor to discuss further.  We also discussed that if they decided to have children and not do preimplantation testing, that we do not recommend prenatal testing for the BRCA2 mutation, or testing minor children for this.  Our technology and treatment in the future will hopefully be much better and more refined so that when their children are facing screening and testing, their options may be different than what is available now.  Lastly, we reviewed Dominique Cannon family history and that he does not have a family history of breast or ovarian cancer.  We discussed that while BRCA2 mutations are dominantly inherited breast/ovarian cancer genes, if both parent carry a BRCA2 mutation then children are at risk for a recessively inherited condition called Fanconi Anemia.  SUPPORT AND RESOURCES: If Dominique Cannon is interested in BRCA-specific information and support, there are two groups, Facing Our Risk (www.facingourrisk.com) and Bright Pink (www.brightpink.org) which some people have found useful. They provide opportunities to speak with other individuals from  high-risk families. To locate genetic counselors in other cities, visit the website of the [employer] (ArtistMovie.se) and Secretary/administrator for a Social worker by zip code.  We encouraged Ms. Strohman to remain in contact with [place] on an annual basis so we can update her personal and family histories, and let her know of advances in cancer genetics that may benefit the family. Our contact number was provided. Ms. Glas questions were answered to her satisfaction today, and she knows she is welcome to call anytime with additional questions.   Karen P. Florene Glen, [place], [facility] Certified Genetic Counselor [email or web address] phone: [phone]

## 2015-10-21 ENCOUNTER — Telehealth: Payer: Self-pay | Admitting: Oncology

## 2015-10-21 NOTE — Telephone Encounter (Signed)
Called and left a message with June follow up

## 2015-10-22 ENCOUNTER — Other Ambulatory Visit: Payer: Self-pay | Admitting: Obstetrics and Gynecology

## 2015-10-22 DIAGNOSIS — Z1509 Genetic susceptibility to other malignant neoplasm: Principal | ICD-10-CM

## 2015-10-22 DIAGNOSIS — Z1501 Genetic susceptibility to malignant neoplasm of breast: Secondary | ICD-10-CM

## 2015-10-25 ENCOUNTER — Telehealth: Payer: Self-pay | Admitting: Emergency Medicine

## 2015-10-25 ENCOUNTER — Telehealth: Payer: Self-pay | Admitting: Nurse Practitioner

## 2015-10-25 DIAGNOSIS — Z1501 Genetic susceptibility to malignant neoplasm of breast: Secondary | ICD-10-CM

## 2015-10-25 DIAGNOSIS — Z1509 Genetic susceptibility to other malignant neoplasm: Principal | ICD-10-CM

## 2015-10-25 NOTE — Telephone Encounter (Signed)
Patient is called about the need to have PUS and CA 125.  She would like to see Dr. Hyacinth MeekerMiller as she already has a relationship started.  After discussing some information she states she was never on OCP but went right away to IUD.  She is more interested in OCP vs. Paraguard IUD.  She would like to get PUS on 12/14  pm or 12/20 pm.  She is not available on Thursday 12/15 or 12/22.  Will need to confirm scheduling with Dr. Hyacinth MeekerMiller schedule and get back to her.

## 2015-10-25 NOTE — Telephone Encounter (Signed)
Patient returned call. She is scheduled for Pelvic ultrasound  And CA 125 with Dr. Hyacinth MeekerMiller for 11/02/15 at 1530 with 1600 consult with Dr. Hyacinth MeekerMiller (has prior established relationship with Dr. Hyacinth MeekerMiller).   Advised will be contacted with benefits information.  Patient agreeable.  Orders placed under Dr. Hyacinth MeekerMiller.  Routing to provider for final review. Patient agreeable to disposition. Will close encounter.    Cc Dr. Hyacinth MeekerMiller

## 2015-10-25 NOTE — Telephone Encounter (Signed)
Message left to return call to [place] at [phone].   Need to schedule Pelvic ultrasound and lab draw.

## 2015-10-27 ENCOUNTER — Ambulatory Visit
Admission: RE | Admit: 2015-10-27 | Discharge: 2015-10-27 | Disposition: A | Discharge status: Home / Self Care | Payer: BC Managed Care – PPO | Source: Ambulatory Visit | Attending: Certified Nurse Midwife | Admitting: Certified Nurse Midwife

## 2015-10-27 DIAGNOSIS — R922 Inconclusive mammogram: Secondary | ICD-10-CM

## 2015-10-27 DIAGNOSIS — Z803 Family history of malignant neoplasm of breast: Secondary | ICD-10-CM

## 2015-10-27 MED ORDER — GADOBENATE DIMEGLUMINE 529 MG/ML IV SOLN
10.0000 mL | Freq: Once | INTRAVENOUS | Status: AC | PRN
  Administered 2015-10-27: 10 mL via INTRAVENOUS

## 2015-10-28 NOTE — Telephone Encounter (Signed)
Called patient to review benefits for procedure. Left voicemail to call back and review. °

## 2015-11-01 ENCOUNTER — Other Ambulatory Visit: Payer: Self-pay | Admitting: Certified Nurse Midwife

## 2015-11-01 NOTE — Telephone Encounter (Signed)
Patient had an breast MRI done last week and is calling to get her results. Patient is concerned her results went to [facility] instead of [facility].

## 2015-11-01 NOTE — Telephone Encounter (Signed)
Call to patient. Notified that Breast MRI was normal. Advised of showing dense breasts and need for 3D mammogram in the future. She has discussed recommendations with Dr. Darnelle CatalanMagrinat.  Patient is scheduled for Pelvic ultrasound with Dr. Hyacinth MeekerMiller tomorrow, 11/02/15.  Will follow up with Dr. Hyacinth MeekerMiller and Dr. Darnelle CatalanMagrinat as scheduled.  Routing to provider for final review. Patient agreeable to disposition. Will close encounter.

## 2015-11-02 ENCOUNTER — Ambulatory Visit (INDEPENDENT_AMBULATORY_CARE_PROVIDER_SITE_OTHER): Payer: BC Managed Care – PPO

## 2015-11-02 ENCOUNTER — Ambulatory Visit (INDEPENDENT_AMBULATORY_CARE_PROVIDER_SITE_OTHER): Payer: BC Managed Care – PPO | Admitting: Obstetrics & Gynecology

## 2015-11-02 VITALS — BP 126/68 | HR 84 | Resp 14 | Wt 124.0 lb

## 2015-11-02 DIAGNOSIS — Z1501 Genetic susceptibility to malignant neoplasm of breast: Secondary | ICD-10-CM | POA: Diagnosis not present

## 2015-11-02 DIAGNOSIS — Z1509 Genetic susceptibility to other malignant neoplasm: Principal | ICD-10-CM

## 2015-11-02 DIAGNOSIS — Z803 Family history of malignant neoplasm of breast: Secondary | ICD-10-CM | POA: Diagnosis not present

## 2015-11-02 NOTE — Progress Notes (Signed)
29 y.o. G0P0 Married Caucasian female here for pelvic ultrasound due to BRCA 2 positive testing obtained 09/27/15 showing a deleterious BRCA 2 mutation, specifically [id number].  This was done due to family hx of breast cancer in her mother in her early 44's.  She died at age 74.  Pt's maternal GM had ovarian cancer and she has a maternal aunt with breast cancer hx as well.  Pt is here for a PUS and ca-125.  Pt is accompanied by her spouse today.    She was seen in the high risk breast clinic yesterday and saw Dr. Jana Hakim as well.  She hopes to talk about contraceptive options as well today.  Currently has a Mirena IUD in place that is due for removal by 07/03/16.  Patient's last menstrual period was 10/15/2015.  Contraception: Mirena IUD  Findings:  UTERUS: 7.6 x 4.7 x 4.0cm EMS: 3.67m ADNEXA: Left ovary: 3.5 x 2.9 x 2.4cm with 1.8cm resolving corpus luteal cyst (day 19 of cycle)       Right ovary: 3.0 x 2.2 x 1.1cm CUL DE SAC:  No free fluid  Discussion:  First we reviewed PUS findings and need for every six month PUS and Ca-125.  She will also have a Ca-125 obtained today.  Pregnancy discussed.  They are very surprised by the news of her positive BRCA testing.  However, they are dealing with it well.  They are not ready to proceed with child bearing at this time but to think in the next couple of years, they will be ready to proceed.  They do understand about the possibility of IVF with pre-implantation genetic screening and embryo selection if they choose not to pass along this gene to future children.  They are not sure, right now, how they feel ethically about this but are grateful the possibility exists for them to consider this in the future.  She is not sure what she wants to do about prophylactic mastectomies at this point.  She is considering delaying this until after child bearing.  She is planning to see a pPsychiatric nursejust for discussion.  She is aware of the importance  of yearly MRIs.  Pt had a negative breast MRI 10/27/15.  Will plan to have her MMG in Sept and then the MRI maybe 4-5 or five months later so we can get these on an every six month evaluation.    Contraception discussion occurred as well.  Dr. MJana Hakimrecommended copper IUD.  Geneticist (with pt and in note) indicated OCPs would be option as well.  Pt has loved her Mirena IUD.  Really not interested in copper IUD.  Will plan to remove IUD once decision made about contraception.    Assessment: H/O BRCA 2 mutation, specifically [id number]  Plan:   Breast cancer risk reduction: Pt is going to see Dr. MJana Hakimin six months.  Raloxifene risk reduction to be discussed at next appt. Considering prophylactic mastectomy, seeing plastic surgeon MMG and MRI, yearly.  Will try to space these out a little more next year to get them on every six month scheduling.  Ovarian cancer risk reduction: PUS and Ca-125 every six months.  Will plan to do right after menstrual cycle if possible.  (normal findings today.  Ca-125 was 17).  Pt still considering options regarding OCPs.  Mirena due to be removed by 8/17 at the latest.    ~30 minutes spent with patient >50% of time was in face to face discussion of above.

## 2015-11-04 LAB — CA 125: CA 125: 17 U/mL (ref <35)

## 2015-11-09 ENCOUNTER — Telehealth: Payer: Self-pay | Admitting: Emergency Medicine

## 2015-11-09 NOTE — Telephone Encounter (Signed)
Patient returned call and she is given message from Dr. Hyacinth MeekerMiller. Verbalized understanding and will follow up as needed.

## 2015-11-09 NOTE — Telephone Encounter (Signed)
Message left to return call to Dredyn Gubbels at [phone].    

## 2015-11-09 NOTE — Telephone Encounter (Signed)
-----   Message from [name], MD sent at 11/05/2015  2:25 PM EST ----- Please inform pt ca 125 is normal.

## 2015-11-17 ENCOUNTER — Encounter (HOSPITAL_COMMUNITY): Payer: Self-pay

## 2015-12-10 ENCOUNTER — Encounter: Payer: Self-pay | Admitting: Obstetrics & Gynecology

## 2016-04-19 ENCOUNTER — Ambulatory Visit (HOSPITAL_BASED_OUTPATIENT_CLINIC_OR_DEPARTMENT_OTHER): Payer: BC Managed Care – PPO | Admitting: Oncology

## 2016-04-19 ENCOUNTER — Telehealth: Payer: Self-pay | Admitting: Oncology

## 2016-04-19 VITALS — BP 120/83 | HR 82 | Temp 98.3°F | Resp 18 | Ht 68.0 in | Wt 124.2 lb

## 2016-04-19 DIAGNOSIS — Z803 Family history of malignant neoplasm of breast: Secondary | ICD-10-CM | POA: Diagnosis not present

## 2016-04-19 DIAGNOSIS — Z1509 Genetic susceptibility to other malignant neoplasm: Principal | ICD-10-CM

## 2016-04-19 DIAGNOSIS — Z1501 Genetic susceptibility to malignant neoplasm of breast: Secondary | ICD-10-CM | POA: Diagnosis not present

## 2016-04-19 NOTE — Telephone Encounter (Signed)
appt made and avs printed °

## 2016-04-19 NOTE — Progress Notes (Signed)
[name]  [phone] [phone]     ID: Dominique Cannon DOB: 1986/03/07  MR#: [id number]  IHK#:[id number]  Patient Care Team: No Pcp Per Patient as PCP - General (General Practice) Nunzio Cobbs, MD as Consulting Physician (Obstetrics and Gynecology) Chauncey Cruel, MD as Consulting Physician (Oncology) Irene Limbo, MD as Consulting Physician (Plastic Surgery) PCP: No PCP Per Patient SU:  OTHER MD:  CHIEF COMPLAINT: BRCA 2 positivity  CURRENT TREATMENT: Observation   HISTORY OF PRESENT ILLNESS: From the original intake note:  Dominique Cannon has a significant family history suggesting a hereditary breast cancer syndrome and her gynecologist [name] working with Dr. [name] referred her for genetic evaluation. 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 The other 19 genes tested were unremarkable.  Her subsequent history is as detailed below  INTERVAL HISTORY: Shamera returns today for follow-up of her BRCA mutation accompanied by her husband Dian Situ. Interval history is significant for her having made several decisions which I think are important long-term. They are still thinking of having a child at some point in the not too distant future. Dominique Cannon wants to nurse and so she is postponing bilateral mastectomies at least until after that is accomplished. Also she established a screening protocol with Dr. Sabra Heck for the ovarian cancer risk.  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 She hopes to hear by December. She is not exercising as much as I would like but she does yoga about once a week and she does walk her dog maybe 20 minutes a day.She tells me last year she was very distraught  with all this news but at this point she is living with it as normally as she can and in fact seems to be doing an excellent job of it. A detailed review of systems today was otherwise entirely negative  PAST MEDICAL HISTORY: Past Medical History  Diagnosis Date  . Family history of breast cancer in mother     mother age 28 died of breast cancer  . Family history of breast cancer   . Family history of ovarian cancer   . BRCA2 positive     PAST SURGICAL HISTORY: Past Surgical History  Procedure Laterality Date  . Wisdom tooth extraction    . Intrauterine device insertion  07/04/11    Mirena IUD insertion by Dr. Edwinna Areola    FAMILY HISTORY Family History  Problem Relation Age of Onset  . Breast cancer Mother 28  . Ovarian cancer Maternal Grandmother     dx in her 30s  . Breast cancer Paternal Grandmother   . Cancer Maternal Uncle     NOS  . Other Other     positive genetic testing for "the gene"  . Breast cancer Other     MGF's sister  . Breast cancer Other     MGF's mother   the patient's mother died at the age of 74 from breast cancer. The patient's mother had one sister, in good health. The patient's mother's mother has a history of ovarian cancer. One maternal great aunt also had a history of breast cancer. A second has tested positive for a gene mutation, but we do not have those records. On the paternal side there is  also a history of breast cancer, likely postmenopausal  GYNECOLOGIC HISTORY:  No LMP recorded. Menarche age 30, the patient is GX P0. She uses a Mirena IUD for contraception.   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 Her husband Dian Situ is also a Art therapist, and band leader. They have no children but are hoping to have a family in the next few years.    ADVANCED DIRECTIVES: Not in place   HEALTH MAINTENANCE: Social History  Substance Use Topics  . Smoking status: Never Smoker   . Smokeless tobacco: Never Used  . Alcohol  Use: 0.6 oz/week    1 Standard drinks or equivalent per week     Colonoscopy:  PAP:  Bone density:  Lipid panel:  No Known Allergies  Current Outpatient Prescriptions  Medication Sig Dispense Refill  . levonorgestrel (MIRENA) 20 MCG/24HR IUD 1 each by Intrauterine route once.     No current facility-administered medications for this visit.    OBJECTIVE: Young white woman who appears stated age  30 Vitals:   04/19/16 1531  BP: 120/83  Pulse: 82  Temp: 98.3 F (36.8 C)  Resp: 18     Body mass index is 18.89 kg/(m^2).    ECOG FS:0 - Asymptomatic  Sclerae unicteric, pupils round and equal Oropharynx clear and moist-- no thrush or other lesions No cervical or supraclavicular adenopathy Lungs no rales or rhonchi Heart regular rate and rhythm Abd soft, nontender, positive bowel sounds MSK no focal spinal tenderness, no upper extremity lymphedema Neuro: nonfocal, well oriented, appropriate affect Breasts: no lumps and no suspicious skin or nipple findingsin either breast; both axillae are benign.  LAB RESULTS:  CMP  No results found for: NA, K, CL, CO2, GLUCOSE, BUN, CREATININE, CALCIUM, PROT, ALBUMIN, AST, ALT, ALKPHOS, BILITOT, GFRNONAA, GFRAA  INo results found for: SPEP, UPEP  No results found for: WBC, NEUTROABS, HGB, HCT, MCV, PLT    Chemistry   No results found for: NA, K, CL, CO2, BUN, CREATININE, GLU No results found for: CALCIUM, ALKPHOS, AST, ALT, BILITOT     No results found for: LABCA2  No components found for: [id number]  No results for input(s): INR in the last 168 hours.  Urinalysis No results found for: COLORURINE, APPEARANCEUR, LABSPEC, PHURINE, GLUCOSEU, HGBUR, BILIRUBINUR, KETONESUR, PROTEINUR, UROBILINOGEN, NITRITE, LEUKOCYTESUR  STUDIES: CLINICAL DATA: HIGH RISK SCREENING BREAST MRI. Patient is a BRCA 2 carrier with a strong family history of breast carcinoma. Mother died at age 28 from breast carcinoma. Patient has dense fibroglandular  tissue on mammography.  LABS: Labs not drawn at time of imaging.  EXAM: BILATERAL BREAST MRI WITH AND WITHOUT CONTRAST  TECHNIQUE: Multiplanar, multisequence MR images of both breasts were obtained prior to and following the intravenous administration of 10 ml of MultiHance.  THREE-DIMENSIONAL MR IMAGE RENDERING ON INDEPENDENT WORKSTATION:  Three-dimensional MR images were rendered by post-processing of the original MR data on an independent workstation. The three-dimensional MR images were interpreted, and findings are reported in the following complete MRI report for this study. Three dimensional images were evaluated at the independent DynaCad workstation  COMPARISON: Previous mammogram, 08/09/2015. No other prior exams.  FINDINGS: Breast composition: d. Extreme fibroglandular tissue.  Background parenchymal enhancement: Moderate.  Right breast: No mass or abnormal enhancement.  Left breast: No mass or abnormal enhancement.  Lymph nodes: No abnormal appearing lymph nodes.  Ancillary findings: None.  IMPRESSION: Normal exam. No evidence of malignancy. No abnormal enhancement.  RECOMMENDATION: Continue annual screening mammography.  Recommend annual or biannual breast screening MRI given the patient's history.  BI-RADS CATEGORY 1: Negative.   Electronically Signed  By: Lajean Manes M.D.  On: 10/27/2015 11:35   CLINICAL DATA: Screening. Baseline exam. The patient's mother was diagnosed with breast cancer at the age of 71. The patient also reports a family history of breast cancer in her paternal grandmother in her 78s.  EXAM: DIGITAL SCREENING BILATERAL MAMMOGRAM WITH CAD  COMPARISON: None.  ACR Breast Density Category d: The breast tissue is extremely dense, which lowers the sensitivity of mammography.  FINDINGS: There are no findings suspicious for malignancy. Images were processed with CAD.  IMPRESSION: No  mammographic evidence of malignancy. A result letter of this screening mammogram will be mailed directly to the patient.  RECOMMENDATION: 1. Screening mammogram in one year.(Code:SM-B-01Y) 2. Annual high risk screening breast MRI is also recommended.  Based on the Tyrer-Cuzick risk assessment model, the patient has a greater than 20% lifetime risk of developing breast cancer. By the recommendations of the [place], annual MRI and annual mammography are recommended.  BI-RADS CATEGORY 1: Negative.   Electronically Signed  By: Pamelia Hoit M.D.  On: 08/11/2015 17:05       ASSESSMENT: 30 y.o. BRCA2 positive [place], [place] woman  (1) patient has decided against bilateral mastectomies and BSO, at least until child-bearing and nursing are complete  (2) patient is not planning to start tamoxifen at this point  (3) patient is considering OCPs for birth control and ovarian cancer risk-reduction  (4) breast cancer screening: yearly mammogram and MRI, preferably 6 months apart; biannual exams  (5) ovarian cancer screening: pelvic US every 6 months with CA 125  PLAN: Dhruti is doing an excellent job of living a normal life under the threat of her genetic mutation. She is also I think making good quality of life decisions which in her case mean keeping her breasts so she can nurse after eventual pregnancy.  Today I gave her information on tamoxifen which she can consider for breast cancer risk reduction.She has a good understanding of the possible toxicities, side effects and complications, as well as the possible benefits. At this point she is considering it but is not very attracted to the idea.  Her Mirena IUD is coming out in a couple of months. That is probably a good time to start oral contraceptives as suggested by Dr Sabra Heck. That would decrease the risk of ovarian cancer and we do not have data showing increased risk of breast cancer from oral  contraceptives.  I'm trying to get her mammography and breast MRI separated by 6 months. If we can get that accomplished I would try to get her mammography moved to July.  In any case she will see Dr Sabra Heck again in Sept and will see me in December. She knows to call for any problems that may develop before her next visit here.    Marland KitchenChauncey Cruel, MD   04/19/2016 3:55 PM Medical Oncology and Hematology [facility] [address] [place], [place] [address] Tel. [phone]    Fax. [phone]

## 2016-04-24 ENCOUNTER — Other Ambulatory Visit: Payer: Self-pay | Admitting: *Deleted

## 2016-04-24 DIAGNOSIS — Z308 Encounter for other contraceptive management: Secondary | ICD-10-CM

## 2016-06-16 ENCOUNTER — Other Ambulatory Visit: Payer: Self-pay | Admitting: Obstetrics & Gynecology

## 2016-06-16 DIAGNOSIS — Z1231 Encounter for screening mammogram for malignant neoplasm of breast: Secondary | ICD-10-CM

## 2016-06-16 DIAGNOSIS — Z803 Family history of malignant neoplasm of breast: Secondary | ICD-10-CM

## 2016-06-19 ENCOUNTER — Ambulatory Visit: Payer: BC Managed Care – PPO | Admitting: Nurse Practitioner

## 2016-07-31 ENCOUNTER — Encounter: Payer: Self-pay | Admitting: Obstetrics & Gynecology

## 2016-07-31 ENCOUNTER — Ambulatory Visit: Payer: BC Managed Care – PPO | Admitting: Obstetrics & Gynecology

## 2016-07-31 VITALS — BP 108/70 | HR 100 | Resp 14 | Ht 68.0 in | Wt 125.4 lb

## 2016-07-31 DIAGNOSIS — Z01419 Encounter for gynecological examination (general) (routine) without abnormal findings: Secondary | ICD-10-CM

## 2016-07-31 DIAGNOSIS — Z1502 Genetic susceptibility to malignant neoplasm of ovary: Secondary | ICD-10-CM

## 2016-07-31 DIAGNOSIS — Z30432 Encounter for removal of intrauterine contraceptive device: Secondary | ICD-10-CM

## 2016-07-31 DIAGNOSIS — Z124 Encounter for screening for malignant neoplasm of cervix: Secondary | ICD-10-CM

## 2016-07-31 DIAGNOSIS — Z1509 Genetic susceptibility to other malignant neoplasm: Secondary | ICD-10-CM

## 2016-07-31 DIAGNOSIS — Z1501 Genetic susceptibility to malignant neoplasm of breast: Secondary | ICD-10-CM

## 2016-07-31 NOTE — Progress Notes (Signed)
30 y.o. G0P0 MarriedCaucasianF here for annual exam.  Doing well.  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  This will help her get a raise.  She will not know until December about the results.  Has decided to have her IUD removed.  Not sure she wants to actively try for pregnancy but is ready for IUD removal.  Does not want to be on OCPs.    Has decided to not proceed with any breast surgery or to take Tamoxifen at this time.  Patient's last menstrual period was 07/24/2016.          Sexually active: Yes.    The current method of family planning is IUD.    Exercising: Yes.    walking, yoga Smoker:  no  Health Maintenance: Pap:  07/23/15 negative  History of abnormal Pap:  no MMG:  08/11/15 BIRADS 1 negative  Colonoscopy:  never BMD:   never TDaP:  2011 Pneumonia vaccine(s):  never Zostavax:   never Hep C testing: not indicated Screening Labs: CA-125 drawn, Hb today: declined, Urine today: declined   reports that she has never smoked. She has never used smokeless tobacco. She reports that she drinks about 0.6 oz of alcohol per week . She reports that she does not use drugs.  Past Medical History:  Diagnosis Date  . BRCA2 positive   . Family history of breast cancer   . Family history of breast cancer in mother    mother age 81 died of breast cancer  . Family history of ovarian cancer     Past Surgical History:  Procedure Laterality Date  . INTRAUTERINE DEVICE INSERTION  07/04/11   Mirena IUD insertion by Dr. Edwinna Areola  . WISDOM TOOTH EXTRACTION      Current Outpatient Prescriptions  Medication Sig Dispense Refill  . levonorgestrel (MIRENA) 20 MCG/24HR IUD 1 each by Intrauterine route once.     No current facility-administered medications for this visit.     Family History  Problem Relation Age of Onset  . Breast cancer Mother 64  . Ovarian cancer Maternal Grandmother     dx in her 70s  . Breast cancer Paternal Grandmother   . Cancer  Maternal Uncle     NOS  . Other Other     positive genetic testing for "the gene"  . Breast cancer Other     MGF's sister  . Breast cancer Other     MGF's mother    ROS:  Pertinent items are noted in HPI.  Otherwise, a comprehensive ROS was negative.  Exam:   BP 108/70 (BP Location: Right Arm, Patient Position: Sitting, Cuff Size: Normal)   Pulse 100   Resp 14   Ht _0  (1.727 m)   Wt 125 lb 6.4 oz (56.9 kg)   LMP 07/24/2016   BMI 19.07 kg/m   Weight change: +1#   Height: _1  (172.7 cm)  Ht Readings from Last 3 Encounters:  07/31/16 _2  (1.727 m)  04/19/16 _3  (1.727 m)  10/20/15 _4  (1.727 m)   General appearance: alert, cooperative and appears stated age Head: Normocephalic, without obvious abnormality, atraumatic Neck: no adenopathy, supple, symmetrical, trachea midline and thyroid normal to inspection and palpation Lungs: clear to auscultation bilaterally Breasts: normal appearance, no masses or tenderness Heart: regular rate and rhythm Abdomen: soft, non-tender; bowel sounds normal; no masses,  no organomegaly Extremities: extremities normal, atraumatic, no cyanosis or edema Skin: Skin color, texture, turgor  normal. No rashes or lesions Lymph nodes: Cervical, supraclavicular, and axillary nodes normal. No abnormal inguinal nodes palpated Neurologic: Grossly normal   Pelvic: External genitalia:  no lesions              Urethra:  normal appearing urethra with no masses, tenderness or lesions              Bartholins and Skenes: normal                 Vagina: normal appearing vagina with normal color and discharge, no lesions              Cervix: no lesions              Pap taken: Yes.   Bimanual Exam:  Uterus:  normal size, contour, position, consistency, mobility, non-tender              Adnexa: normal adnexa and no mass, fullness, tenderness               Rectovaginal: Confirms               Anus:  normal sphincter tone, no lesions  Procedure:  Speculum  placed.  IUD string noted and removed with one pull.  Pt tolerated procedure well.  Instruments removed.  Chaperone was present for exam.  A:  Well Woman with normal exam H/O Mirena IUD use, s/p removal today H/O BRCA genet mutation, [id number]  P:   Yearly 3D MMG and MRI.  Will try to get these spaced out a little further this next year. Pap only obtained Ca-125 today.  Will need to schedule PUS for pt. Mirena removed today.  Recommended starting PNV.  Will use natural family planning for now for contraception. Needs every six month PUS with ca 125

## 2016-07-31 NOTE — Patient Instructions (Signed)
Check multivitamin folic acid dose.  Needs to be or greater.

## 2016-08-01 LAB — CA 125: CA 125: 14 U/mL (ref <35)

## 2016-08-02 NOTE — Addendum Note (Signed)
Addended by: Jerene BearsMILLER, Wai Litt S on: 08/02/2016 05:52 AM   Modules accepted: Orders

## 2016-08-03 ENCOUNTER — Telehealth: Payer: Self-pay | Admitting: Obstetrics & Gynecology

## 2016-08-03 LAB — IPS PAP TEST WITH REFLEX TO HPV

## 2016-08-03 NOTE — Telephone Encounter (Signed)
Called patient to review benefits for a recommended procedure. Left Voicemail requesting a call back. °

## 2016-08-04 ENCOUNTER — Telehealth: Payer: Self-pay

## 2016-08-04 NOTE — Telephone Encounter (Signed)
Left message to call Valentina Alcoser at [phone]. 

## 2016-08-04 NOTE — Telephone Encounter (Signed)
Spoke with patient. Advised of results and message as seen below from Dr.Miller. Patient is agreeable and verbalizes understanding. Patient is requesting a late afternoon appointment due to her schedule. PUS scheduled for 08/15/2016 at 4 pm with 4:30 pm consult with Dr. Hyacinth MeekerMiller. Patient is agreeable to date and time. Benefits for PUS discussed with patient. Patient verbalizes understanding.  Routing to provider for final review. Patient agreeable to disposition. Will close encounter.

## 2016-08-04 NOTE — Telephone Encounter (Signed)
-----   Message from [name], MD sent at 08/02/2016  5:50 AM EDT ----- Inform Ca-125 was normal.  Needs PUS set up due to genetic increased risk for ovarian cancer.  Order has been placed.  Thanks.

## 2016-08-04 NOTE — Telephone Encounter (Signed)
Called patient to review benefits for a recommended procedure. Left Voicemail requesting a call back. °

## 2016-08-14 ENCOUNTER — Ambulatory Visit
Admission: RE | Admit: 2016-08-14 | Discharge: 2016-08-14 | Disposition: A | Discharge status: Home / Self Care | Payer: BC Managed Care – PPO | Source: Ambulatory Visit | Attending: Obstetrics & Gynecology | Admitting: Obstetrics & Gynecology

## 2016-08-14 DIAGNOSIS — Z1231 Encounter for screening mammogram for malignant neoplasm of breast: Secondary | ICD-10-CM

## 2016-08-14 DIAGNOSIS — Z803 Family history of malignant neoplasm of breast: Secondary | ICD-10-CM

## 2016-08-15 ENCOUNTER — Encounter: Payer: Self-pay | Admitting: Obstetrics & Gynecology

## 2016-08-15 ENCOUNTER — Ambulatory Visit (INDEPENDENT_AMBULATORY_CARE_PROVIDER_SITE_OTHER): Payer: BC Managed Care – PPO | Admitting: Obstetrics & Gynecology

## 2016-08-15 ENCOUNTER — Ambulatory Visit (INDEPENDENT_AMBULATORY_CARE_PROVIDER_SITE_OTHER): Payer: BC Managed Care – PPO

## 2016-08-15 VITALS — BP 104/60 | HR 80 | Resp 16 | Ht 68.0 in | Wt 125.6 lb

## 2016-08-15 DIAGNOSIS — Z1501 Genetic susceptibility to malignant neoplasm of breast: Secondary | ICD-10-CM | POA: Diagnosis not present

## 2016-08-15 DIAGNOSIS — Z1509 Genetic susceptibility to other malignant neoplasm: Secondary | ICD-10-CM

## 2016-08-15 DIAGNOSIS — Z1502 Genetic susceptibility to malignant neoplasm of ovary: Secondary | ICD-10-CM

## 2016-08-15 DIAGNOSIS — N83201 Unspecified ovarian cyst, right side: Secondary | ICD-10-CM

## 2016-08-15 NOTE — Progress Notes (Signed)
30 y.o. 1080P0 Married Caucasian female here for pelvic ultrasound due to h/o increased genetic risk for ovarian cancer.  Ca-125 was normal on 07/31/16.  Patient's last menstrual period was 07/24/2016.  Contraception: none  Findings:  UTERUS: 7.7 x 5.0 x 4.0  EMS:2.8199mm ADNEXA: Left ovary: 3.8 x 2.1 x 2.0cm with left 1.7 x 1.3cm simple appearing follicle       Right ovary: 4.6 x 3.0 x 2.0cm with 2.6 x 1.4cm resolving corpus luteal cyst. CUL DE SAC:  No free fluid  Discussion:  Findings reviewed.  D/W pt optimal time for ultrasounds are just after her cycle as this is the time least likely to see cysts on the ovaries.  However, ultrasound today reassuring with normal findings.  All questions answered.  Assessment:  Normal ultrasound with right corpus luteal cyst and left follicle Increased genetic risk for breast and ovarian cancer  Plan:  MMG just done.  MRI will need to be scheduled. AEX 1 year  ~15 minutes spent with patient >50% of time was in face to face discussion of above.

## 2016-08-28 ENCOUNTER — Telehealth: Payer: Self-pay

## 2016-08-28 DIAGNOSIS — Z803 Family history of malignant neoplasm of breast: Secondary | ICD-10-CM

## 2016-08-28 DIAGNOSIS — Z1509 Genetic susceptibility to other malignant neoplasm: Principal | ICD-10-CM

## 2016-08-28 DIAGNOSIS — Z1501 Genetic susceptibility to malignant neoplasm of breast: Secondary | ICD-10-CM

## 2016-08-28 NOTE — Telephone Encounter (Signed)
Spoke with patient. Advised of message and results as seen below from Dr.Miller. Patient is agreeable and verbalizes understanding. Would like to proceed in December depending on scheduling. Order placed for bilateral breast MRI w wo contrast [facility] with a note that this will need to be scheduled in December. Patient is aware she will be [facility] regarding [facility]. This will be precertified through our office and [facility] will be notified of precertification. Patient is agreeable. Patient placed in imaging hold.  Routing to provider for final review. Patient agreeable to disposition. Will close encounter.

## 2016-08-28 NOTE — Telephone Encounter (Signed)
-----  Message from Megan Salon, MD sent at 08/23/2016 11:25 AM EDT ----- Pt's MMG is normal.  She has BRCA 2 gene so needs MRI.  Last one was December 14th.  She and I are trying to stretch out the MMG and MRI to every six months.  Please call her and see if she wants to the MRI before the end of the year or if she can do it in January.  Then next year, we'll go a little longer between the MMG and MRI and hopefully get them to around every six months.  Needs to go in imaging hold to ensure gets done.  Thanks.

## 2016-09-26 DIAGNOSIS — Z0289 Encounter for other administrative examinations: Secondary | ICD-10-CM

## 2016-10-04 DIAGNOSIS — Z0289 Encounter for other administrative examinations: Secondary | ICD-10-CM

## 2016-10-19 ENCOUNTER — Telehealth: Payer: Self-pay | Admitting: Obstetrics & Gynecology

## 2016-10-19 NOTE — Telephone Encounter (Signed)
Spoke with patient in regards to scheduling MRI.  Patient states she will need to schedule in January, 2018 and will contact [facility] at that time.  Forwarding to Dr Hyacinth MeekerMiller for final review

## 2016-10-19 NOTE — Telephone Encounter (Signed)
Called patient to discuss scheduling recommended [facility]. Left Voicemail requesting a call back.

## 2016-10-20 NOTE — Telephone Encounter (Signed)
Thank you.  Ok to close encounter. 

## 2016-10-23 NOTE — Telephone Encounter (Signed)
Closing encounter

## 2016-10-30 ENCOUNTER — Ambulatory Visit (HOSPITAL_BASED_OUTPATIENT_CLINIC_OR_DEPARTMENT_OTHER): Payer: BC Managed Care – PPO | Admitting: Oncology

## 2016-10-30 VITALS — BP 123/80 | HR 92 | Temp 98.0°F | Resp 18 | Ht 68.0 in | Wt 122.6 lb

## 2016-10-30 DIAGNOSIS — Z1501 Genetic susceptibility to malignant neoplasm of breast: Secondary | ICD-10-CM

## 2016-10-30 DIAGNOSIS — Z1502 Genetic susceptibility to malignant neoplasm of ovary: Secondary | ICD-10-CM

## 2016-10-30 DIAGNOSIS — Z8041 Family history of malignant neoplasm of ovary: Secondary | ICD-10-CM

## 2016-10-30 DIAGNOSIS — Z1379 Encounter for other screening for genetic and chromosomal anomalies: Secondary | ICD-10-CM

## 2016-10-30 DIAGNOSIS — Z803 Family history of malignant neoplasm of breast: Secondary | ICD-10-CM

## 2016-10-30 DIAGNOSIS — Z1509 Genetic susceptibility to other malignant neoplasm: Principal | ICD-10-CM

## 2016-10-30 NOTE — Progress Notes (Addendum)
[facility]  [phone] [phone]    The following is a corrected copy of the original 10/30/2016 dictation   [name] DOB: 1986-04-27  MR#: [id number]  RNH#:[id number]  Patient Care Team: No Pcp Per Patient as PCP - General (General Practice) Chauncey Cruel, MD as Consulting Physician (Oncology) Megan Salon, MD as Consulting Physician (Gynecology) PCP: No PCP Per Patient SU:  OTHER MD:  CHIEF COMPLAINT: BRCA 2 positivity  CURRENT TREATMENT: Intensive monitoring   HISTORY OF PRESENT ILLNESS: From the original intake note:  Lajuana has a significant family history suggesting a hereditary breast cancer syndrome and her gynecologist [name] working with Dr. [name] referred her for genetic evaluation. 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 The other 19 genes tested were unremarkable.  Her subsequent history is as detailed below  INTERVAL HISTORY: Novaleigh returns today for follow-up of her BRCA2 mutation. Since her last visit here she and her husband Dian Situ have decided to go ahead and start a family. Accordingly she is now off the Mirena IUD, as of September 2017. She started having regular periods in October.  REVIEW OF SYSTEMS: She is currently not exercising regularly. Otherwise a detailed review of systems today was entirely negative.  PAST MEDICAL HISTORY: Past Medical History:  Diagnosis Date  . BRCA2 positive   . Family history of breast cancer   . Family history of breast cancer in mother    mother age 33 died of breast cancer  . Family history of ovarian cancer     PAST SURGICAL HISTORY: Past Surgical History:  Procedure Laterality Date  . INTRAUTERINE DEVICE INSERTION  07/04/11   Mirena IUD insertion by Dr. Edwinna Areola  . WISDOM TOOTH EXTRACTION      FAMILY HISTORY Family History  Problem Relation  Age of Onset  . Breast cancer Mother 32  . Ovarian cancer Maternal Grandmother     dx in her 27s  . Breast cancer Paternal Grandmother   . Cancer Maternal Uncle     NOS  . Other Other     positive genetic testing for "the gene"  . Breast cancer Other     MGF's sister  . Breast cancer Other     MGF's mother   the patient's mother died at the age of 62 from breast cancer. The patient's mother had one sister, in good health. The patient's mother's mother has a history of ovarian cancer. One maternal great aunt also had a history of breast cancer. A second has tested positive for a gene mutation, but we do not have those records. On the paternal side there is also a history of breast cancer, likely postmenopausal  GYNECOLOGIC HISTORY:  No LMP recorded. Menarche age 65, the patient is GX P0. She uses a Mirena IUD for contraception.   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 Her husband Dian Situ is also a Art therapist, and band leader. They have no children but are hoping to have a family in the next few years.    ADVANCED DIRECTIVES: Not in place   HEALTH MAINTENANCE: Social History  Substance Use Topics  . Smoking status: Never Smoker  . Smokeless tobacco: Never Used  . Alcohol use 0.6 oz/week    1 Standard drinks or equivalent per week     Colonoscopy:  PAP:  Bone density:  Lipid panel:  No Known Allergies  No current outpatient prescriptions on file.   No current facility-administered medications for this visit.     OBJECTIVE: Young white woman In no acute distress Vitals:   10/30/16 1540  BP: 123/80  Pulse: 92  Resp: 18  Temp: 98 F (36.7 C)     Body mass index is 18.64 kg/m.    ECOG FS:0 - Asymptomatic  Sclerae unicteric, EOMs intact Oropharynx clear and moist No cervical or supraclavicular adenopathy Lungs no rales or rhonchi Heart regular rate and rhythm Abd soft, nontender, positive bowel sounds MSK no focal spinal tenderness, no  upper extremity lymphedema Neuro: nonfocal, well oriented, appropriate affect Breasts: I do not palpate any suspicious findings in either breast. Both axillae are benign.   LAB RESULTS:  CMP  No results found for: NA, K, CL, CO2, GLUCOSE, BUN, CREATININE, CALCIUM, PROT, ALBUMIN, AST, ALT, ALKPHOS, BILITOT, GFRNONAA, GFRAA  INo results found for: SPEP, UPEP  No results found for: WBC, NEUTROABS, HGB, HCT, MCV, PLT    Chemistry   No results found for: NA, K, CL, CO2, BUN, CREATININE, GLU No results found for: CALCIUM, ALKPHOS, AST, ALT, BILITOT     No results found for: LABCA2  No components found for: QIHKV425  No results for input(s): INR in the last 168 hours.  Urinalysis No results found for: COLORURINE, APPEARANCEUR, LABSPEC, PHURINE, GLUCOSEU, HGBUR, BILIRUBINUR, KETONESUR, PROTEINUR, UROBILINOGEN, NITRITE, LEUKOCYTESUR  STUDIES: CLINICAL DATA:  Screening.  EXAM: 2D DIGITAL SCREENING BILATERAL MAMMOGRAM WITH CAD AND ADJUNCT TOMO  COMPARISON:  Previous exam(s).  ACR Breast Density Category d: The breast tissue is extremely dense, which lowers the sensitivity of mammography.  FINDINGS: There are no findings suspicious for malignancy. Images were processed with CAD.  IMPRESSION: No mammographic evidence of malignancy. A result letter of this screening mammogram will be mailed directly to the patient.  RECOMMENDATION: Screening mammogram at age 30. (Code:SM-B-40A)  BI-RADS CATEGORY  1: Negative.   Electronically Signed   By: Fidela Salisbury M.D.   On: 08/17/2016 09:27  ASSESSMENT: 30 y.o. BRCA 2 positive [place], [place] woman with brest density category D   (1) patient has decided against bilateral mastectomies and BSO, at least until child-bearing and nursing are complete  (2) patient is not planning to start tamoxifen at this point  (3) patient is considering OCPs for birth control and ovarian cancer risk-reduction  (4) breast cancer  screening: yearly mammogram and MRI, preferably 6 months apart; biannual exams  (5) ovarian cancer screening: pelvic US every 6 months with CA 125  PLAN: Evon is doing terrific, with no findings suggestive of rest or ovarian carcinoma.  She is now off contraceptives and we did discuss the recent data from [place] showing that even the very low dose oral contraceptives we have been using or transvaginal hormones have a measurable increase in the risk of breast cancer. Of course does not the whole story since there are other benefits including decrease in ovarian cancer or decrease in endometrial cancer if the Mirena is used for decrease in pregnancy which itself can be life-threatening. Nevertheless the risk of breast cancer is there and needs to be discussed in these cases.  Since she is off contraceptives now that is not an issue. Once she completes her family she may want to consider again the possibility of going on tamoxifen with a Mirena which I think would be the best combination for her.  I have moved her breast MRI to late March or early April, and it should  be done right after her. So there should be no question regarding whether she is pregnant at the time and of course so they will be minimal confusion because of obtaining studies at different times during her cycle.  Otherwise she is seeing Dr. Sabra Heck regularly, so if she has another breast exam through her in about 6 months, I will see her again in a year  Altair has a good understanding of this plan. She agrees to it. She knows the goal of our interventions are prevention. She will call with any problems that may develop before her next visit.    Marland KitchenChauncey Cruel, MD   10/30/2016 4:15 PM Medical Oncology and Hematology [facility] [address] [place], [place] [address] Tel. [phone]    Fax. [phone]

## 2016-12-18 ENCOUNTER — Telehealth: Payer: Self-pay | Admitting: Obstetrics & Gynecology

## 2016-12-18 NOTE — Telephone Encounter (Signed)
Patient called and scheduled an appointment for 12/22/16 to see Dr. Hyacinth MeekerMiller for a positive pregnancy test. LMP: 11/16/16.  Patient reports she is not having any problems.  Routing to triage for FYI.

## 2016-12-18 NOTE — Telephone Encounter (Signed)
Routing to Dr.Miller as FYI. Will close encounter. 

## 2016-12-22 ENCOUNTER — Ambulatory Visit (INDEPENDENT_AMBULATORY_CARE_PROVIDER_SITE_OTHER): Payer: BC Managed Care – PPO | Admitting: Obstetrics & Gynecology

## 2016-12-22 ENCOUNTER — Encounter: Payer: Self-pay | Admitting: Obstetrics & Gynecology

## 2016-12-22 VITALS — BP 104/62 | HR 96 | Resp 16 | Ht 68.0 in | Wt 129.0 lb

## 2016-12-22 DIAGNOSIS — N912 Amenorrhea, unspecified: Secondary | ICD-10-CM

## 2016-12-22 LAB — POCT URINE PREGNANCY: PREG TEST UR: POSITIVE — AB

## 2016-12-22 NOTE — Progress Notes (Signed)
GYNECOLOGY  VISIT   HPI: 31 y.o. G1P0 Married Caucasian female with LMP 11/16/16 here with + UPT.  Feeling really good.  Having breast tenderness but no nausea.  Husband is with her today.  Denies vaginal bleeding or pelvic pain.  No cats in the home.  CMV risk low as not around young children.   Tdap 2011.  Had chicken pox.  Aware flu vaccine safe and recommended.   Fish/shellfish/mercury discuss.  Patient does not eat fish.  Unpasteurized cheese/juices discussed.  Nitrites in foods disucssed.  Exercise and intercourse discussed.  Fetal DNA particle testing discussed.  First trimester down's testing discussed.  Cystic fibrosis discussed.     Patient Active Problem List   Diagnosis Date Noted  . Genetic testing 09/27/2015  . 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 Family history of breast cancer   . Family history of ovarian cancer     Past Medical History:  Diagnosis Date  . BRCA2 positive   . Family history of breast cancer   . Family history of breast cancer in mother    mother age 4 died of breast cancer  . Family history of ovarian cancer     Past Surgical History:  Procedure Laterality Date  . INTRAUTERINE DEVICE INSERTION  07/04/11   Mirena IUD insertion by Dr. Edwinna Areola  . WISDOM TOOTH EXTRACTION      MEDS:  Reviewed in EPIC and UTD  ALLERGIES: Patient has no known allergies.  Family History  Problem Relation Age of Onset  . Breast cancer Mother 80  . Ovarian cancer Maternal Grandmother     dx in her 8s  . Breast cancer Paternal Grandmother   . Cancer Maternal Uncle     NOS  . Other Other     positive genetic testing for "the gene"  . Breast cancer Other     MGF's sister  . Breast cancer Other     MGF's mother    SH:  Married, non smoker  Review of Systems  All other systems reviewed and are negative.   PHYSICAL EXAMINATION:    BP 104/62 (BP Location: Right Arm, Patient Position: Sitting, Cuff Size: Normal)   Pulse 96   Resp 16   Ht _0  (1.727  m)   Wt 129 lb (58.5 kg)   LMP 11/16/2016   BMI 19.61 kg/m     Physical Exam  Constitutional: She is oriented to person, place, and time. She appears well-developed and well-nourished.  Neurological: She is alert and oriented to person, place, and time.  Psychiatric: She has a normal mood and affect.   Assessment: Early pregnancy BRCA 2 positive  Plan: Return for viability scan Continue PNV   ~15 minutes spent with patient >50% of time was in face to face discussion of above.

## 2016-12-27 ENCOUNTER — Other Ambulatory Visit: Payer: Self-pay

## 2016-12-27 ENCOUNTER — Telehealth: Payer: Self-pay | Admitting: Obstetrics & Gynecology

## 2016-12-27 DIAGNOSIS — Z3201 Encounter for pregnancy test, result positive: Secondary | ICD-10-CM

## 2016-12-27 DIAGNOSIS — N912 Amenorrhea, unspecified: Secondary | ICD-10-CM

## 2016-12-27 NOTE — Telephone Encounter (Signed)
Called patient to review benefits for viability ultrasound scheduled 01-04-17. Left voicemail with copay and appointment details per most recent DPR. Left contact information to call back with questions.

## 2017-01-04 ENCOUNTER — Encounter: Payer: Self-pay | Admitting: Obstetrics & Gynecology

## 2017-01-04 ENCOUNTER — Ambulatory Visit (INDEPENDENT_AMBULATORY_CARE_PROVIDER_SITE_OTHER): Payer: BC Managed Care – PPO | Admitting: Obstetrics & Gynecology

## 2017-01-04 ENCOUNTER — Ambulatory Visit (INDEPENDENT_AMBULATORY_CARE_PROVIDER_SITE_OTHER): Payer: BC Managed Care – PPO

## 2017-01-04 VITALS — BP 110/70 | HR 74 | Resp 16 | Wt 128.0 lb

## 2017-01-04 DIAGNOSIS — N912 Amenorrhea, unspecified: Secondary | ICD-10-CM | POA: Diagnosis not present

## 2017-01-04 DIAGNOSIS — Z3201 Encounter for pregnancy test, result positive: Secondary | ICD-10-CM

## 2017-01-04 DIAGNOSIS — O020 Blighted ovum and nonhydatidiform mole: Secondary | ICD-10-CM

## 2017-01-04 NOTE — Progress Notes (Signed)
31 y.o. G1P0 Married Caucasian female here for viability PUS.  Is having some nausea and fatigue.  No pelvic pain.  No vaginal bleeding.  Have discussed with Dr. Darnelle CatalanMagrinat that MRI due in December has not been done.  He advised to wait until pregnancy is complete before proceeding--so ok to not do one this year.  Pt advised.  Patient's last menstrual period was 11/16/2016.  Findings  UTERUS: Single gestational sac measuring 2.1 x 0.9cm.  No yolk sac, no fetal pole, subchorionic hemorrhage measuring 2.3 x 0.8cm noted ADNEXA: Left ovary: 2.8 x 2.4cm with 2.0 x 2.1cm corpus luteal cyst       Right ovary: 2.5 x 1.3cm CUL DE SAC: no free fluid  Discussion:  Findings reviewed with pt.  Although her dating is good, this could be just a very early pregnancy so too early to see anything other than fetal pole.  Also, anembryonic pregnancy possible and likely due to dating.  Will need to check HCG and blood type and make plans once these are back.  Pt tearful and anxious.  All questions answered..  Assessment:  Pregnancy with evidence of being anembryonic  Plan:  HCG and ABO blood type pending.  Pt will return for these tomorrow as is late in the date and phlebotomy not available in the office at this time.  Pt does not want to go to outpatient lab to have done tonight.    ~15 minutes spent with patient >50% of time was in face to face discussion of above.       ADDENDUM: A SUBCHORIONIC HEMORRHAGE- 23 X 20 MM OBSERVED, LOCATED RIGHT LATERAL TO GESTATIONAL SAC.

## 2017-01-05 ENCOUNTER — Other Ambulatory Visit (INDEPENDENT_AMBULATORY_CARE_PROVIDER_SITE_OTHER): Payer: BC Managed Care – PPO

## 2017-01-05 ENCOUNTER — Telehealth: Payer: Self-pay | Admitting: *Deleted

## 2017-01-05 DIAGNOSIS — O020 Blighted ovum and nonhydatidiform mole: Secondary | ICD-10-CM

## 2017-01-05 LAB — HCG, QUANTITATIVE, PREGNANCY: HCG, BETA CHAIN, QUANT, S: 34962.9 m[IU]/mL — AB

## 2017-01-05 NOTE — Telephone Encounter (Signed)
Katina calling from [facility] with STAT lab report.   Hcg Quantitative 34,962.9  Results available for review in EPIC dated 01/05/17.  Dr. Hyacinth MeekerMiller, please review and advise?

## 2017-01-06 ENCOUNTER — Telehealth: Payer: Self-pay | Admitting: Obstetrics & Gynecology

## 2017-01-06 LAB — ABO AND RH: RH TYPE: POSITIVE

## 2017-01-06 MED ORDER — MISOPROSTOL 200 MCG PO TABS
ORAL_TABLET | ORAL | 0 refills | Status: DC
Start: 2017-01-06 — End: 2017-01-11

## 2017-01-06 NOTE — Telephone Encounter (Signed)
Pt contacted and notified of results.  Offered expectant management with repeat ultrasound if desired, D&E, vs medical termination with misoprostol.  Pt will call back with decision.  Ok to close encounter.

## 2017-01-06 NOTE — Telephone Encounter (Signed)
31 yo G1 at 7 2/7 by LMP with anembryonic pregnancy (no fetal pole or yolk sac but HCG >34,000) who has decided to proceed with medical termination of pregnancy.  Will use misoprostol 800mcg pv x 1, repeat in 12 hours.  Pt is to pre-treat with 4mg  of immodium and 500mg  tylenol.  Pt needs to call and give update after 48 hours.  Aware of risks of failure and possible need for future intervention, bleeding, low grade temp,, possible infection if incomplete abortion occurs.  Will need to follow down HCGs so will need follow up in 1-2 weeks after miscarriage occurs.    Aware of MD on call for weekend and how to reach her if has questions.  Pt has my cell number but is aware I will be traveling tomorrow so may not be able to answer quickly.

## 2017-01-08 NOTE — Telephone Encounter (Signed)
I would anticipate that she would have bleeding heavier than a normal cycle after using the misoprostol. If she never saturated more than one pad, I would guess that she isn't done.  She can come in for evaluation. She can wait, it can still occur in the next week. Or she can repeat the misoprostol (800 mcg pv x 1).  She can pretreat with tylenol and lomotil if desires.

## 2017-01-08 NOTE — Telephone Encounter (Signed)
Spoke with patient. Patient states that she took Misoprostol yesterday. Took last dose at 10:30 pm Reports having increased bleeding between taking first and second dosage. Patient is having very light bleeding today. Has had to change her pad.tampon once. Denies any cramping, pain, fever, or heavy bleeding. Aware light bleeding is to be expected. Advised as long as she is not having heavy bleeding, pain, or fever, okay to monitor at this time. Advised if she develops any of theses symptoms she will need to be seen for further evaluation with our office or ER. Patient verbalizes understanding. Follow up appointment scheduled with Dr.Miller on 01/19/2017 at 3 pm. Patient is agreeable to date and time. Asking what she should expect moving forward and if there is anything else she needs to do. Advised will review with Dr.Jertson as Dr.Miller is out of the office and return call. Patient is agreeable.

## 2017-01-08 NOTE — Telephone Encounter (Signed)
Left message to call Kaitlyn at [phone]. 

## 2017-01-08 NOTE — Telephone Encounter (Signed)
Please call and check on this patient (see note from Dr Hyacinth MeekerMiller)

## 2017-01-08 NOTE — Telephone Encounter (Signed)
Spoke with patient. Advised of message as seen below from Dr.Jertson. Patient verbalizes understanding. Patient would like to wait for a few more days to see how she does. Will call to update with any increase in bleeding or if no change by Thursday morning.

## 2017-01-10 NOTE — Telephone Encounter (Signed)
Spoke with patient. Patient states that she has been having very minimal spotting that is brown in color. Has not had any bleeding like a menses or heavier. Denies any red blood. Denies fever, pain, or chills. Reports that she is having very minimal cramping since taking the Cytotec. Advised another round of Cytotec is recommended. Patient would like to be seen for evaluation before moving forward. Declines appointment today. Appointment scheduled for tomorrow at 11:30 am with Dr.Jertson as Dr.Miller is out of the office. Patient is agreeable to date, time, and to see covering provider. Bleeding precautions given.  Routing to covering provider for final review. Patient agreeable to disposition. Will close encounter.

## 2017-01-11 ENCOUNTER — Ambulatory Visit (INDEPENDENT_AMBULATORY_CARE_PROVIDER_SITE_OTHER): Payer: BC Managed Care – PPO

## 2017-01-11 ENCOUNTER — Encounter: Payer: Self-pay | Admitting: Obstetrics and Gynecology

## 2017-01-11 ENCOUNTER — Telehealth: Payer: Self-pay | Admitting: *Deleted

## 2017-01-11 ENCOUNTER — Ambulatory Visit (INDEPENDENT_AMBULATORY_CARE_PROVIDER_SITE_OTHER): Payer: BC Managed Care – PPO | Admitting: Obstetrics and Gynecology

## 2017-01-11 VITALS — BP 100/60 | HR 80 | Resp 16 | Ht 68.0 in | Wt 128.2 lb

## 2017-01-11 DIAGNOSIS — O039 Complete or unspecified spontaneous abortion without complication: Secondary | ICD-10-CM | POA: Diagnosis not present

## 2017-01-11 LAB — HCG, QUANTITATIVE, PREGNANCY: hCG, Beta Chain, Quant, S: 44890.2 m[IU]/mL — ABNORMAL HIGH

## 2017-01-11 NOTE — Telephone Encounter (Signed)
Spoke with patient. Advised of results as seen below from Dr.Jertson. Advised ultrasound is recommended today for further evaluation. Patient is agreeable. Ultrasound scheduled for today at 2:45 pm with 3 pm consult with Dr.Jertson after review with Gerrie. Patient is agreeable to date and time.  Routing to provider for final review. Patient agreeable to disposition. Will close encounter.

## 2017-01-11 NOTE — Progress Notes (Signed)
GYNECOLOGY  VISIT   HPI: 31 y.o.   Married  Caucasian  female   G1P0 with Patient's last menstrual period was 01/07/2017.   here for follow-up of miscarriage. The patient took misoprostol x 2 on Sunday. With the first dose she has some nausea. She pre-treated with imodium and tylenol.  She bleed prior to taking the second dose, she filled one pad in 2 hours and that was it. She was crampy that day. Her bleeding was lighter and much shorter than a normal cycles for her. Since then she has had brown spotting that has tapered off. She was having breast tenderness and bloating prior to taking the medication. Both of those symptoms have improved, just minimal now.  GYNECOLOGIC HISTORY: Patient's last menstrual period was 01/07/2017. Contraception:none Menopausal hormone therapy: none        OB History    Gravida Para Term Preterm AB Living   1 0           SAB TAB Ectopic Multiple Live Births                     Patient Active Problem List   Diagnosis Date Noted  . Genetic testing 09/27/2015  . 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 Family history of breast cancer   . Family history of ovarian cancer     Past Medical History:  Diagnosis Date  . BRCA2 positive   . Family history of breast cancer   . Family history of breast cancer in mother    mother age 29 died of breast cancer  . Family history of ovarian cancer     Past Surgical History:  Procedure Laterality Date  . INTRAUTERINE DEVICE INSERTION  07/04/11   Mirena IUD insertion by Dr. Leda Quail  . WISDOM TOOTH EXTRACTION      No current outpatient prescriptions on file.   No current facility-administered medications for this visit.      ALLERGIES: Patient has no known allergies.  Family History  Problem Relation Age of Onset  . Breast cancer Mother 12  . Ovarian cancer Maternal Grandmother     dx in her 52s  . Breast cancer Paternal Grandmother   . Cancer Maternal Uncle     NOS  . Other Other     positive genetic  testing for "the gene"  . Breast cancer Other     MGF's sister  . Breast cancer Other     MGF's mother    Social History   Social History  . Marital status: Married    Spouse name: N/A  . Number of children: N/A  . Years of education: N/A   Occupational History  . Not on file.   Social History Main Topics  . Smoking status: Never Smoker  . Smokeless tobacco: Never Used  . Alcohol use 0.6 oz/week    1 Standard drinks or equivalent per week  . Drug use: No  . Sexual activity: Yes    Partners: Male    Birth control/ protection: IUD     Comment: Mirena inserted 07/04/11   Other Topics Concern  . Not on file   Social History Narrative  . No narrative on file    Review of Systems  Constitutional: Negative.   HENT: Negative.   Eyes: Negative.   Respiratory: Negative.   Cardiovascular: Negative.   Gastrointestinal: Negative.   Genitourinary: Negative.   Musculoskeletal: Negative.   Skin: Negative.   Neurological: Negative.   Endo/Heme/Allergies: Negative.  Psychiatric/Behavioral: Negative.     PHYSICAL EXAMINATION:    BP 100/60 (BP Location: Right Arm, Patient Position: Sitting, Cuff Size: Normal)   Pulse 80   Resp 16   Ht '5\' 8"'$  (1.727 m)   Wt 128 lb 3.2 oz (58.2 kg)   LMP 01/07/2017   BMI 19.49 kg/m     General appearance: alert, cooperative and appears stated age  Pelvic: External genitalia:  no lesions              Urethra:  normal appearing urethra with no masses, tenderness or lesions              Bartholins and Skenes: normal                 Vagina: normal appearing vagina with normal color and discharge, no lesions. Small amount of brown vaginal discharge.              Cervix: no lesions              Bimanual Exam:  Uterus:  normal size, contour, position, consistency, mobility, non-tender and anteverted              Adnexa: no mass, fullness, tenderness               Chaperone was present for exam.  ASSESSMENT SAB, s/p cytotec x 2 last  weekend. She had a short episode of heavy bleeding for 2 hours    PLAN Will send off a stat BhcG  Discussed options for management, the BhcG may help her decide   An After Visit Summary was printed and given to the patient.  Addendum: BhcG returned at [phone], up from [phone] 6 days ago. I had the patient return for a f/u ultrasound. The gestational sac grew slightly. No yolk sac or fetal pole, subchorionic bleed no longer seen.  I spoke with the patient about her options of repeating the cytotec, expectant management or D&E in the OR. I reviewed the D&E procedure including the risks. I offered to set up the D&E with Dr Sabra Heck at the beginning of the week. She would like to discuss with her husband and get back to [place]. She will call and speak with [facility].   25 minutes face to face time of which over 50% was spent in counseling.   CC: Dr Sabra Heck

## 2017-01-11 NOTE — Telephone Encounter (Signed)
Delaney Meigsamara calling from [facility] with STAT lab: hCG quantitative 44, 890.2  [facility] Routing to Dr. Oscar LaJertson for review.

## 2017-01-12 ENCOUNTER — Telehealth: Payer: Self-pay | Admitting: Obstetrics and Gynecology

## 2017-01-12 NOTE — Telephone Encounter (Signed)
Call back to patient regarding surgery procedure scheduling. Possible time options discussed but advised need to confirm with Dr Hyacinth MeekerMiller. Surgery instructions reviewed. Patient agreeable to tentative plan for Tuesday 01-16-17 and confirm on Monday 01-15-17.

## 2017-01-12 NOTE — Telephone Encounter (Signed)
Left message to call [name] at [phone].  

## 2017-01-12 NOTE — Telephone Encounter (Signed)
Spoke with patient, patient states she would like to move forward with possibly scheduling D&C next week if that is an option. Advised patient would update Dr. Oscar LaJertson on decision and return call with recommendations, patient is agreeable.  Dr. Oscar LaJertson, please review and advise.  Cc: Kaitlyn Sprague

## 2017-01-12 NOTE — Telephone Encounter (Signed)
Please schedule her for a D&E with Dr Hyacinth MeekerMiller early next week. I typically pre-treat with cytotec 400 mcg 2-3 hours prior to the procedure. She will need pre-op antibiotics. [facility]

## 2017-01-12 NOTE — Telephone Encounter (Signed)
Patient calling to let the nurse and Dr Oscar LaJertson know what she has decided to do.

## 2017-01-15 ENCOUNTER — Other Ambulatory Visit: Payer: Self-pay | Admitting: Obstetrics & Gynecology

## 2017-01-15 ENCOUNTER — Encounter (HOSPITAL_COMMUNITY): Payer: Self-pay

## 2017-01-15 NOTE — Telephone Encounter (Signed)
Call to patient to confirm details for procedure tomorrow. Left message to call back.

## 2017-01-15 NOTE — Telephone Encounter (Signed)
Patient returns call. Advised Dr Hyacinth MeekerMiller has reviewed chart and confirmed procedure for noon 01-16-17. Per Dr Hyacinth MeekerMiller, may omit Cytotec order.  Encounter closed.

## 2017-01-16 ENCOUNTER — Ambulatory Visit (HOSPITAL_COMMUNITY)
Admission: RE | Admit: 2017-01-16 | Discharge: 2017-01-16 | Disposition: A | Discharge status: Home / Self Care | Payer: BC Managed Care – PPO | Source: Ambulatory Visit | Attending: Obstetrics & Gynecology | Admitting: Obstetrics & Gynecology

## 2017-01-16 ENCOUNTER — Encounter (HOSPITAL_COMMUNITY): Payer: Self-pay

## 2017-01-16 ENCOUNTER — Ambulatory Visit (HOSPITAL_COMMUNITY): Payer: BC Managed Care – PPO | Admitting: Anesthesiology

## 2017-01-16 ENCOUNTER — Encounter (HOSPITAL_COMMUNITY)
Admission: RE | Disposition: A | Discharge status: Home / Self Care | Payer: Self-pay | Source: Ambulatory Visit | Attending: Obstetrics & Gynecology

## 2017-01-16 DIAGNOSIS — O020 Blighted ovum and nonhydatidiform mole: Secondary | ICD-10-CM | POA: Insufficient documentation

## 2017-01-16 DIAGNOSIS — O021 Missed abortion: Secondary | ICD-10-CM | POA: Diagnosis not present

## 2017-01-16 HISTORY — PX: DILATION AND EVACUATION: SHX1459

## 2017-01-16 LAB — CBC
HEMATOCRIT: 38.2 % (ref 36.0–46.0)
HEMOGLOBIN: 12.9 g/dL (ref 12.0–15.0)
MCH: 30.1 pg (ref 26.0–34.0)
MCHC: 33.8 g/dL (ref 30.0–36.0)
MCV: 89.3 fL (ref 78.0–100.0)
Platelets: 297 10*3/uL (ref 150–400)
RBC: 4.28 MIL/uL (ref 3.87–5.11)
RDW: 12.7 % (ref 11.5–15.5)
WBC: 7.8 10*3/uL (ref 4.0–10.5)

## 2017-01-16 SURGERY — DILATION AND EVACUATION, UTERUS
Anesthesia: Monitor Anesthesia Care | Site: Vagina

## 2017-01-16 MED ORDER — LIDOCAINE HCL (CARDIAC) 20 MG/ML IV SOLN
INTRAVENOUS | Status: AC
  Filled 2017-01-16: qty 5

## 2017-01-16 MED ORDER — LIDOCAINE-EPINEPHRINE 1 %-1:100000 IJ SOLN
INTRAMUSCULAR | Status: DC | PRN
  Administered 2017-01-16: 10 mL

## 2017-01-16 MED ORDER — DEXAMETHASONE SODIUM PHOSPHATE 10 MG/ML IJ SOLN
INTRAMUSCULAR | Status: DC | PRN
Start: 2017-01-16 — End: 2017-01-16
  Administered 2017-01-16: 10 mg via INTRAVENOUS

## 2017-01-16 MED ORDER — KETOROLAC TROMETHAMINE 30 MG/ML IJ SOLN
INTRAMUSCULAR | Status: AC
  Filled 2017-01-16: qty 1

## 2017-01-16 MED ORDER — LACTATED RINGERS IV SOLN
INTRAVENOUS | Status: DC
  Administered 2017-01-16: 125 mL/h via INTRAVENOUS
  Administered 2017-01-16: 13:00:00 via INTRAVENOUS

## 2017-01-16 MED ORDER — MIDAZOLAM HCL 2 MG/2ML IJ SOLN
INTRAMUSCULAR | Status: DC | PRN
  Administered 2017-01-16: 2 mg via INTRAVENOUS

## 2017-01-16 MED ORDER — LIDOCAINE HCL (CARDIAC) 20 MG/ML IV SOLN
INTRAVENOUS | Status: DC | PRN
  Administered 2017-01-16: 50 mg via INTRAVENOUS

## 2017-01-16 MED ORDER — ONDANSETRON HCL 4 MG/2ML IJ SOLN: 4.0000 mg | Freq: Once | INTRAMUSCULAR | Status: DC | PRN

## 2017-01-16 MED ORDER — FENTANYL CITRATE (PF) 100 MCG/2ML IJ SOLN
INTRAMUSCULAR | Status: DC | PRN
Start: 2017-01-16 — End: 2017-01-16
  Administered 2017-01-16: 100 ug via INTRAVENOUS

## 2017-01-16 MED ORDER — LIDOCAINE-EPINEPHRINE 1 %-1:100000 IJ SOLN
INTRAMUSCULAR | Status: AC
  Filled 2017-01-16: qty 1

## 2017-01-16 MED ORDER — FENTANYL CITRATE (PF) 100 MCG/2ML IJ SOLN: 25.0000 ug | INTRAMUSCULAR | Status: DC | PRN

## 2017-01-16 MED ORDER — DEXAMETHASONE SODIUM PHOSPHATE 10 MG/ML IJ SOLN
INTRAMUSCULAR | Status: AC
  Filled 2017-01-16: qty 1

## 2017-01-16 MED ORDER — SCOPOLAMINE 1 MG/3DAYS TD PT72
1.0000 | MEDICATED_PATCH | Freq: Once | TRANSDERMAL | Status: DC
  Administered 2017-01-16: 1.5 mg via TRANSDERMAL

## 2017-01-16 MED ORDER — OXYCODONE HCL 5 MG PO TABS: 5.0000 mg | ORAL_TABLET | Freq: Once | ORAL | Status: DC | PRN

## 2017-01-16 MED ORDER — PROPOFOL 10 MG/ML IV BOLUS
INTRAVENOUS | Status: AC
  Filled 2017-01-16: qty 20

## 2017-01-16 MED ORDER — IBUPROFEN 800 MG PO TABS: 800.0000 mg | ORAL_TABLET | Freq: Three times a day (TID) | ORAL | 0 refills | Status: DC | PRN

## 2017-01-16 MED ORDER — SCOPOLAMINE 1 MG/3DAYS TD PT72
MEDICATED_PATCH | TRANSDERMAL | Status: AC
  Administered 2017-01-16: 1.5 mg via TRANSDERMAL
  Filled 2017-01-16: qty 1

## 2017-01-16 MED ORDER — FENTANYL CITRATE (PF) 100 MCG/2ML IJ SOLN
INTRAMUSCULAR | Status: AC
  Filled 2017-01-16: qty 2

## 2017-01-16 MED ORDER — ONDANSETRON HCL 4 MG/2ML IJ SOLN
4.0000 mg | Freq: Once | INTRAMUSCULAR | Status: DC | PRN
Start: 2017-01-16 — End: 2017-01-16

## 2017-01-16 MED ORDER — ONDANSETRON HCL 4 MG/2ML IJ SOLN
INTRAMUSCULAR | Status: DC | PRN
  Administered 2017-01-16: 4 mg via INTRAVENOUS

## 2017-01-16 MED ORDER — MIDAZOLAM HCL 2 MG/2ML IJ SOLN
INTRAMUSCULAR | Status: AC
Start: 2017-01-16 — End: 2017-01-16
  Filled 2017-01-16: qty 2

## 2017-01-16 MED ORDER — PROPOFOL 10 MG/ML IV BOLUS
INTRAVENOUS | Status: DC | PRN
  Administered 2017-01-16 (×2): 20 mg via INTRAVENOUS
  Administered 2017-01-16: 30 mg via INTRAVENOUS
  Administered 2017-01-16: 20 mg via INTRAVENOUS
  Administered 2017-01-16: 10 mg via INTRAVENOUS

## 2017-01-16 MED ORDER — HYDROCODONE-ACETAMINOPHEN 5-325 MG PO TABS: 1.0000 | ORAL_TABLET | Freq: Four times a day (QID) | ORAL | 0 refills | Status: DC | PRN

## 2017-01-16 MED ORDER — KETOROLAC TROMETHAMINE 30 MG/ML IJ SOLN
INTRAMUSCULAR | Status: DC | PRN
  Administered 2017-01-16: 30 mg via INTRAMUSCULAR
  Administered 2017-01-16: 30 mg via INTRAVENOUS

## 2017-01-16 MED ORDER — ROCURONIUM BROMIDE 100 MG/10ML IV SOLN
INTRAVENOUS | Status: AC
  Filled 2017-01-16: qty 1

## 2017-01-16 MED ORDER — SUGAMMADEX SODIUM 200 MG/2ML IV SOLN
INTRAVENOUS | Status: AC
  Filled 2017-01-16: qty 2

## 2017-01-16 MED ORDER — OXYCODONE HCL 5 MG/5ML PO SOLN: 5.0000 mg | Freq: Once | ORAL | Status: DC | PRN

## 2017-01-16 MED ORDER — ONDANSETRON HCL 4 MG/2ML IJ SOLN
INTRAMUSCULAR | Status: AC
  Filled 2017-01-16: qty 2

## 2017-01-16 SURGICAL SUPPLY — 18 items
CATH ROBINSON RED A/P 16FR (CATHETERS) ×2 IMPLANT
CLOTH BEACON ORANGE TIMEOUT ST (SAFETY) ×2 IMPLANT
DECANTER SPIKE VIAL GLASS SM (MISCELLANEOUS) ×2 IMPLANT
GLOVE BIOGEL PI IND STRL 7.0 (GLOVE) ×2 IMPLANT
GLOVE BIOGEL PI INDICATOR 7.0 (GLOVE) ×2
GLOVE ECLIPSE 6.5 STRL STRAW (GLOVE) ×4 IMPLANT
GOWN STRL REUS W/TWL LRG LVL3 (GOWN DISPOSABLE) ×4 IMPLANT
KIT BERKELEY 1ST TRIMESTER 3/8 (MISCELLANEOUS) ×2 IMPLANT
NS IRRIG 1000ML POUR BTL (IV SOLUTION) ×2 IMPLANT
PACK VAGINAL MINOR WOMEN LF (CUSTOM PROCEDURE TRAY) ×2 IMPLANT
PAD OB MATERNITY 4.3X12.25 (PERSONAL CARE ITEMS) ×2 IMPLANT
PAD PREP 24X48 CUFFED NSTRL (MISCELLANEOUS) ×2 IMPLANT
SET BERKELEY SUCTION TUBING (SUCTIONS) ×2 IMPLANT
TOWEL OR 17X24 6PK STRL BLUE (TOWEL DISPOSABLE) ×4 IMPLANT
VACURETTE 10 RIGID CVD (CANNULA) IMPLANT
VACURETTE 7MM CVD STRL WRAP (CANNULA) ×2 IMPLANT
VACURETTE 8 RIGID CVD (CANNULA) IMPLANT
VACURETTE 9 RIGID CVD (CANNULA) IMPLANT

## 2017-01-16 NOTE — Discharge Instructions (Addendum)
Post-surgical Instructions, Outpatient Surgery  You may expect to feel dizzy, weak, and drowsy for as long as 24 hours after receiving the medicine that made you sleep (anesthetic). For the first 24 hours after your surgery:    Do not drive a car, ride a bicycle, participate in physical activities, or take public transportation until you are done taking narcotic pain medicines or as directed by Dr. Hyacinth Meeker.   Do not drink alcohol or take tranquilizers.   Do not take medicine that has not been prescribed by your physicians.   Do not sign important papers or make important decisions while on narcotic pain medicines.   Have a responsible person with you.    PAIN MANAGEMENT  Motrin 800mg .  (This is the same as 4-200mg  over the counter tablets of Motrin or ibuprofen.)  You may take this every eight hours or as needed for cramping.    Vicodin 5/325mg .  For more severe pain, take one or two tablets every four to six hours as needed for pain control.  (Remember that narcotic pain medications increase your risk of constipation.  If this becomes a problem, you may take an over the counter stool softener like Colace 100mg  up to four times a day.)  DO'S AND DON'T'S  Do not take a tub bath for one week.  You may shower on the first day after your surgery  Do not do any heavy lifting for one to two weeks.  This increases the chance of bleeding.  Do move around as you feel able.  Stairs are fine.  You may begin to exercise again as you feel able.  Do not lift any weights for two weeks.  Do not put anything in the vagina for two weeks--no tampons, intercourse, or douching.    REGULAR MEDIATIONS/VITAMINS:  You may restart all of your regular medications as prescribed.  You may restart all of your vitamins as you normally take them.    PLEASE CALL OR SEEK MEDICAL CARE IF:  You have persistent nausea and vomiting.   You have trouble eating or drinking.   You have an oral temperature above  100.5.   You have constipation that is not helped by adjusting diet or increasing fluid intake. Pain medicines are a common cause of constipation.   You have heavy vaginal bleeding     Post Anesthesia Home Care Instructions  Activity: Get plenty of rest for the remainder of the day. A responsible adult should stay with you for 24 hours following the procedure.  For the next 24 hours, DO NOT: -Drive a car -Advertising copywriter -Drink alcoholic beverages -Take any medication unless instructed by your physician -Make any legal decisions or sign important papers.  Meals: Start with liquid foods such as gelatin or soup. Progress to regular foods as tolerated. Avoid greasy, spicy, heavy foods. If nausea and/or vomiting occur, drink only clear liquids until the nausea and/or vomiting subsides. Call your physician if vomiting continues.  Special Instructions/Symptoms: Your throat may feel dry or sore from the anesthesia or the breathing tube placed in your throat during surgery. If this causes discomfort, gargle with warm salt water. The discomfort should disappear within 24 hours.  If you had a scopolamine patch placed behind your ear for the management of post- operative nausea and/or vomiting:  1. The medication in the patch is effective for 72 hours, after which it should be removed.  Wrap patch in a tissue and discard in the trash. Wash hands thoroughly with  soap and water. 2. You may remove the patch earlier than 72 hours if you experience unpleasant side effects which may include dry mouth, dizziness or visual disturbances. 3. Avoid touching the patch. Wash your hands with soap and water after contact with the patch.     No ibuprofen products (motrin, advil) or aleve until after 6:00pm today.

## 2017-01-16 NOTE — Anesthesia Preprocedure Evaluation (Addendum)
Anesthesia Evaluation  Patient identified by MRN, date of birth, ID band Patient awake    Reviewed: Allergy & Precautions, NPO status , Patient's Chart, lab work & pertinent test results  Airway Mallampati: II  TM Distance: >3 FB Neck ROM: Full    Dental no notable dental hx. (+) Teeth Intact, Dental Advisory Given   Pulmonary    Pulmonary exam normal breath sounds clear to auscultation       Cardiovascular Normal cardiovascular exam Rhythm:Regular Rate:Normal     Neuro/Psych    GI/Hepatic   Endo/Other    Renal/GU      Musculoskeletal   Abdominal   Peds  Hematology   Anesthesia Other Findings   Reproductive/Obstetrics                            Anesthesia Physical Anesthesia Plan  ASA: II  Anesthesia Plan: MAC   Post-op Pain Management:    Induction: Intravenous  Airway Management Planned: Natural Airway and Simple Face Mask  Additional Equipment:   Intra-op Plan:   Post-operative Plan:   Informed Consent: I have reviewed the patients History and Physical, chart, labs and discussed the procedure including the risks, benefits and alternatives for the proposed anesthesia with the patient or authorized representative who has indicated his/her understanding and acceptance.   Dental advisory given  Plan Discussed with: CRNA and Anesthesiologist  Anesthesia Plan Comments:         Anesthesia Quick Evaluation

## 2017-01-16 NOTE — Op Note (Signed)
01/16/2017  1:06 PM  PATIENT:  Dominique Cannon  31 y.o. female  PRE-OPERATIVE DIAGNOSIS:  Blyted ovum, failed misoprostol use  POST-OPERATIVE DIAGNOSIS:  Blyted ovum, failed misoprostol use  PROCEDURE:  Procedure(s): DILATATION AND EVACUATION  SURGEON:  Morelia Cassells SUZANNE  ASSISTANTS: OR staff   ANESTHESIA:   MAC  ESTIMATED BLOOD LOSS: 50cc  BLOOD ADMINISTERED:none   FLUIDS: 1000ccLR  UOP: 100cc clear uop drained with I&O cath at beginning of procedure  SPECIMEN:  Products of conception  DISPOSITION OF SPECIMEN:  PATHOLOGY  FINDINGS: uterus about 10 weeks in size  DESCRIPTION OF OPERATION: Patient was taken to the operating room.  She is placed in the supine position. SCDs were on her lower extremities and functioning properly. MAC anesthesia was adminstered without difficulty.  Arms were on arm boards to her sides.  Dr. Linna Caprice oversaw the case from anesthesia standpoint.  Legs were then placed in the [place] in the low lithotomy position. The legs were lifted to the high lithotomy position and the Betadine prep was used on the inner thighs perineum and vagina x3. Patient was draped in a normal standard fashion. An in and out catheterization with a red rubber Foley catheter was performed. Approximately 100 cc of clear urine was noted. A heavy weighted speculum was placed in the posterior aspect of the vagina.  The anterior lip of the cervix was grasped with single-tooth tenaculum.  A paracervical block of 1% lidocaine mixed one-to-one with epinephrine (1:100,000 units).  10 cc was used total. The cervix is dilated up to #23 [facility] dilators. The endometrial cavity sounded to 9 cm.   Using a 7 curved tip, the suction tip was passed to the fundus.  Suction was applied.  Suction was between 50 to 31m Hg during the procedure when suction was applied.  Suction tip was rotated clockwise several times with tissue consistent with POC noted.  The suction was stopped and tip removed.   Endometrial cavity was curetted with a #1 smooth curette until a rough gritty texture was noted in all quadrants.  Then the suction tip was passed to the fundus one more time and suction applied.  Minimal blood was noted.    At this point the procedure was ended.  The tenaculum was removed from the anterior lip of the cervix. There was minimal bleeding from the tenaculum sites.  The speculum was removed from the vagina. The prep was cleansed of the patient's skin. The legs are positioned back in the supine position. Sponge, lap, needle, initially counts were correct x2. Patient was taken to recovery in stable condition.  COUNTS:  YES  PLAN OF CARE: Transfer to PACU

## 2017-01-16 NOTE — Anesthesia Postprocedure Evaluation (Signed)
Anesthesia Post Note  Patient: Dominique Cannon  Procedure(s) Performed: Procedure(s) (LRB): DILATATION AND EVACUATION (N/A)  Patient location during evaluation: PACU Anesthesia Type: MAC Level of consciousness: awake and alert Pain management: pain level controlled Vital Signs Assessment: post-procedure vital signs reviewed and stable Respiratory status: spontaneous breathing, nonlabored ventilation, respiratory function stable and patient connected to nasal cannula oxygen Cardiovascular status: stable and blood pressure returned to baseline Anesthetic complications: no        Last Vitals:  Vitals:   01/16/17 1427 01/16/17 1503  BP: 116/73 126/76  Pulse: 99 98  Resp: 16 20  Temp:      Last Pain:  Vitals:   01/16/17 1330  TempSrc:   PainSc: 0-No pain   Pain Goal: Patients Stated Pain Goal: 3 (01/16/17 1117)               Montez Hageman

## 2017-01-16 NOTE — H&P (Signed)
Dominique Cannon is an 31 y.o. female G1P0 MWF with blyted ovum and failed medical management of pregnancy termination with misoprostol.  She has elected to proceed with surgical termination of this nonviable pregnancy.  Procedure, risks and benefits have been reviewed.  All questions answered.  Pertinent Gynecological History: DES exposure: denies Blood transfusions: none Sexually transmitted diseases: no past history Previous GYN Procedures: none  Last mammogram: normal with normal MRI Date: 10/2015 Last pap: normal Date: 9/17 OB History: G1, P0   Menstrual History: Patient's last menstrual period was 11/16/2016 (exact date).    Past Medical History:  Diagnosis Date  . BRCA2 positive   . Family history of breast cancer   . Family history of breast cancer in mother    mother age 53 died of breast cancer  . Family history of ovarian cancer     Past Surgical History:  Procedure Laterality Date  . INTRAUTERINE DEVICE INSERTION  07/04/11   Mirena IUD insertion by Dr. Edwinna Areola  . WISDOM TOOTH EXTRACTION      Family History  Problem Relation Age of Onset  . Breast cancer Mother 57  . Ovarian cancer Maternal Grandmother     dx in her 26s  . Breast cancer Paternal Grandmother   . Cancer Maternal Uncle     NOS  . Other Other     positive genetic testing for "the gene"  . Breast cancer Other     MGF's sister  . Breast cancer Other     MGF's mother    Social History:  reports that she has never smoked. She has never used smokeless tobacco. She reports that she drinks about 0.6 oz of alcohol per week . She reports that she does not use drugs.  Allergies: No Known Allergies  No prescriptions prior to admission.    Review of Systems  All other systems reviewed and are negative.   Blood pressure 123/89, pulse (!) 104, temperature 98.5 F (36.9 C), temperature source Oral, resp. rate 16, last menstrual period 11/16/2016, SpO2 100 %. Physical Exam  Constitutional: She  is oriented to person, place, and time. She appears well-developed and well-nourished.  Cardiovascular: Normal rate and regular rhythm.   Respiratory: Effort normal and breath sounds normal.  Neurological: She is alert and oriented to person, place, and time.  Skin: Skin is warm and dry.  Psychiatric: She has a normal mood and affect.    Results for orders placed or performed during the hospital encounter of 01/16/17 (from the past 24 hour(s))  CBC     Status: None   Collection Time: 01/16/17 11:17 AM  Result Value Ref Range   WBC 7.8 4.0 - 10.5 K/uL   RBC 4.28 3.87 - 5.11 MIL/uL   Hemoglobin 12.9 12.0 - 15.0 g/dL   HCT 38.2 36.0 - 46.0 %   MCV 89.3 78.0 - 100.0 fL   MCH 30.1 26.0 - 34.0 pg   MCHC 33.8 30.0 - 36.0 g/dL   RDW 12.7 11.5 - 15.5 %   Platelets 297 150 - 400 K/uL    No results found.  Assessment/Plan: 31 yo G1P0 with blyted ovum and failed misoprostol use here for suction D&E for completion of pregnancy termination.  [name] SUZANNE 01/16/2017, 12:12 PM

## 2017-01-16 NOTE — Transfer of Care (Signed)
Immediate Anesthesia Transfer of Care Note  Patient: Dominique Cannon  Procedure(s) Performed: Procedure(s): DILATATION AND EVACUATION (N/A)  Patient Location: PACU  Anesthesia Type:MAC  Level of Consciousness: sedated  Airway & Oxygen Therapy: Patient Spontanous Breathing  Post-op Assessment: Report given to RN  Post vital signs: Reviewed and stable  Last Vitals:  Vitals:   01/16/17 1117  BP: 123/89  Pulse: (!) 104  Resp: 16  Temp: 36.9 C    Last Pain:  Vitals:   01/16/17 1117  TempSrc: Oral      Patients Stated Pain Goal: 3 (00/76/22 6333)  Complications: No apparent anesthesia complications

## 2017-01-17 ENCOUNTER — Encounter (HOSPITAL_COMMUNITY): Payer: Self-pay | Admitting: Obstetrics & Gynecology

## 2017-01-19 ENCOUNTER — Ambulatory Visit: Payer: BC Managed Care – PPO | Admitting: Obstetrics & Gynecology

## 2017-01-29 ENCOUNTER — Encounter: Payer: Self-pay | Admitting: Obstetrics & Gynecology

## 2017-01-29 ENCOUNTER — Ambulatory Visit (INDEPENDENT_AMBULATORY_CARE_PROVIDER_SITE_OTHER): Payer: BC Managed Care – PPO | Admitting: Obstetrics & Gynecology

## 2017-01-29 ENCOUNTER — Encounter (INDEPENDENT_AMBULATORY_CARE_PROVIDER_SITE_OTHER): Payer: Self-pay

## 2017-01-29 VITALS — BP 96/60 | HR 80 | Resp 14 | Ht 68.0 in | Wt 129.0 lb

## 2017-01-29 DIAGNOSIS — Z9889 Other specified postprocedural states: Secondary | ICD-10-CM

## 2017-01-29 NOTE — Progress Notes (Signed)
GYNECOLOGY  VISIT   HPI: 31 y.o. G1P0 Married Caucasian female here for follow up after having a D&E for blyted ovum.  Doing well.  Bleeding has stopped.  No pain.  Never took pain medication.  Had constipation for two days post op but this has resolved.  Normal bladder function.  Denies fevers.  Going back to work was hard.  Having appropriate sadness.  Having some issues with "boundaries" with parents.    Has many questions regarding risks for miscarriage with future pregnancies, when to do additional testing, how long to wait before trying again.  Spouse with her.  Lengthy discussion.  All questions answered.  GYNECOLOGIC HISTORY: Patient's last menstrual period was 01/16/2017. Contraception: none  Patient Active Problem List   Diagnosis Date Noted  . Genetic testing 09/27/2015  . 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 Family history of breast cancer   . Family history of ovarian cancer     Past Medical History:  Diagnosis Date  . BRCA2 positive   . Family history of breast cancer   . Family history of breast cancer in mother    mother age 66 died of breast cancer  . Family history of ovarian cancer     Past Surgical History:  Procedure Laterality Date  . DILATION AND EVACUATION N/A 01/16/2017   Procedure: DILATATION AND EVACUATION;  Surgeon: Megan Salon, MD;  Location: [facility];  Service: Gynecology;  Laterality: N/A;  . INTRAUTERINE DEVICE INSERTION  07/04/11   Mirena IUD insertion by Dr. Edwinna Areola  . WISDOM TOOTH EXTRACTION      MEDS:  Reviewed in EPIC and UTD  ALLERGIES: Patient has no known allergies.  Family History  Problem Relation Age of Onset  . Breast cancer Mother 87  . Ovarian cancer Maternal Grandmother     dx in her 63s  . Breast cancer Paternal Grandmother   . Cancer Maternal Uncle     NOS  . Other Other     positive genetic testing for "the gene"  . Breast cancer Other     MGF's sister  . Breast cancer Other     MGF's mother    SH:  Married, non  smoker  Review of Systems  Constitutional: Negative.   Respiratory: Negative.   Cardiovascular: Negative.   Gastrointestinal: Negative.   Genitourinary: Negative.   Psychiatric/Behavioral: Negative.     PHYSICAL EXAMINATION:    BP 96/60 (BP Location: Right Arm, Patient Position: Sitting, Cuff Size: Normal)   Pulse 80   Resp 14   Ht '5\' 8"'$  (1.727 m)   Wt 129 lb (58.5 kg)   LMP 01/16/2017   Breastfeeding? Unknown   BMI 19.61 kg/m     General appearance: alert, cooperative and appears stated age Abdomen: soft, non-tender; bowel sounds normal; no masses,  no organomegaly  Pelvic: External genitalia:  no lesions              Urethra:  normal appearing urethra with no masses, tenderness or lesions              Bartholins and Skenes: normal                 Vagina: normal appearing vagina with normal color and discharge, no lesions              Cervix: no lesions, cervix closed              Bimanual Exam:  Uterus:  normal size, contour, position, consistency,  mobility, non-tender              Adnexa: no mass, fullness, tenderness              Anus:  no lesions  Assessment: S/P D&E due to blyted ovum Appropriate grief reaction  Plan: Continue PNV Pt will wait through two normal cycles and will then start to try again.  If no pregnancy after six months, encouraged follow up visit/consultation.  D/W pt ovulation predictor kits as well, although she got pregnant after her first month of trying this first time.   ~35 minutes spent with patient >50% of time was in face to face discussion of above.

## 2017-08-14 ENCOUNTER — Ambulatory Visit: Payer: BC Managed Care – PPO | Admitting: Obstetrics & Gynecology

## 2017-08-20 ENCOUNTER — Telehealth: Payer: Self-pay | Admitting: Obstetrics & Gynecology

## 2017-08-20 NOTE — Telephone Encounter (Signed)
Ok to give bleeding precautions.  Probably should watch hcgs and follow to normal.  Ok to schedule appt or just lab work.

## 2017-08-20 NOTE — Telephone Encounter (Signed)
Spoke with patient. She states LMP 07-08-17 and had a positive home UPT 2 weeks ago. Had appointment to see Dr.Miller 08-28-17 to confirm pregnancy but she began spotting over weekend and cancelled appt. She is now bleeding like a normal menses. Calling to see what Dr.Miller recommends. She is not having any unusual pelvic pain, just usual menstrual cramping. Patient states due for AEX also. Will discuss with Dr. Hyacinth Meeker and call her back. Routing to Dr.Miller

## 2017-08-20 NOTE — Telephone Encounter (Signed)
Return call to patient with instructions from Dr Hyacinth Meeker. Voice mail confirms first and last name. Left brief message and instructions to call back.

## 2017-08-20 NOTE — Telephone Encounter (Signed)
Returned patient's call and LMOVM to call me back. 

## 2017-08-20 NOTE — Telephone Encounter (Signed)
Patient had a positive home pregnancy test and had appointment scheduled for confirmation. She does not think she is pregnant now and would like an appointment to confirm.

## 2017-08-20 NOTE — Telephone Encounter (Signed)
Call back to patient. Again, VM confirms name. Left detailed message encouraging office visit or at least blood HCG level to follow to negative. Again reminded of precautions for going to ED, bleeding > pad/hour or increase in pain. Advised can send order to lab closer to home for lab to be done at draw station if cannot come to office. Encouraged to call back in am for office visit or sooner to MD on call if any issues arise.

## 2017-08-20 NOTE — Telephone Encounter (Signed)
Patient returning call.

## 2017-08-21 ENCOUNTER — Ambulatory Visit (INDEPENDENT_AMBULATORY_CARE_PROVIDER_SITE_OTHER): Payer: BC Managed Care – PPO | Admitting: Obstetrics & Gynecology

## 2017-08-21 ENCOUNTER — Encounter: Payer: Self-pay | Admitting: Obstetrics & Gynecology

## 2017-08-21 VITALS — BP 122/76 | HR 80 | Resp 16 | Ht 68.0 in | Wt 129.0 lb

## 2017-08-21 DIAGNOSIS — N926 Irregular menstruation, unspecified: Secondary | ICD-10-CM | POA: Diagnosis not present

## 2017-08-21 DIAGNOSIS — O039 Complete or unspecified spontaneous abortion without complication: Secondary | ICD-10-CM

## 2017-08-21 LAB — POCT URINE PREGNANCY: Preg Test, Ur: POSITIVE — AB

## 2017-08-21 NOTE — Telephone Encounter (Signed)
Left message to call Dominique Cannon at [phone].  

## 2017-08-21 NOTE — Progress Notes (Signed)
GYNECOLOGY  VISIT  CC:   Bleeding with pregnancy  HPI: 31 y.o. G22P0 Married Caucasian female here for complaint of vaginal bleeding that is like a period today.  LMP 07/08/17.  Had some cramping and spotting over the weekend.  Picked up yesterday.  Having bleeding like a period today.  No clots.  Having some cramping.  Nothing severe.  Pt and spouse anxious about second pregnancy with poor outcome.  Definitive of recurrent miscarriages as well as evaluation for this reviewed.  Bleeding precautions given.  Pt anxious something she did caused this.  Reviewed causes of recurrent miscarriages.  Nothing she did was likely to have caused this miscarriage.Marland Kitchen  GYNECOLOGIC HISTORY: Patient's last menstrual period was 07/08/2017. Contraception: none  Menopausal hormone therapy: none  Patient Active Problem List   Diagnosis Date Noted  . Genetic testing 09/27/2015  . 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 Family history of breast cancer   . Family history of ovarian cancer     Past Medical History:  Diagnosis Date  . BRCA2 positive   . Family history of breast cancer   . Family history of breast cancer in mother    mother age 56 died of breast cancer  . Family history of ovarian cancer     Past Surgical History:  Procedure Laterality Date  . DILATION AND EVACUATION N/A 01/16/2017   Procedure: DILATATION AND EVACUATION;  Surgeon: Megan Salon, MD;  Location: [facility];  Service: Gynecology;  Laterality: N/A;  . INTRAUTERINE DEVICE INSERTION  07/04/11   Mirena IUD insertion by Dr. Edwinna Areola  . WISDOM TOOTH EXTRACTION      MEDS:   Current Outpatient Prescriptions on File Prior to Visit  Medication Sig Dispense Refill  . Prenatal Vit-Fe Fumarate-FA (PRENATAL VITAMIN PO) Take by mouth daily.     No current facility-administered medications on file prior to visit.     ALLERGIES: Patient has no known allergies.  Family History  Problem Relation Age of Onset  . Breast cancer Mother 58  .  Ovarian cancer Maternal Grandmother        dx in her 24s  . Breast cancer Paternal Grandmother   . Cancer Maternal Uncle        NOS  . Other Other        positive genetic testing for "the gene"  . Breast cancer Other        MGF's sister  . Breast cancer Other        MGF's mother    SH:  Married, non smoker  Review of Systems  Genitourinary:       Unscheduled bleeding   All other systems reviewed and are negative.   PHYSICAL EXAMINATION:    BP 122/76 (BP Location: Right Arm, Patient Position: Sitting, Cuff Size: Normal)   Pulse 80   Resp 16   Ht '5\' 8"'$  (1.727 m)   Wt 129 lb (58.5 kg)   LMP 07/08/2017   Breastfeeding? No   BMI 19.61 kg/m     General appearance: alert, cooperative and appears stated age Pt declined physical exam  Assessment: SAB BRCA2 positive   Plan: HCG obtained.  Already have blood type on file-- A+ Will plan to repeat weekly and follow down. Testing for recurrent miscarriages offered.  Pt will decide if desires. Continue PNV Start '81mg'$  ASA Needs breast MRI scheduled.   ~20 minutes spent with patient >50% of time was in face to face discussion of above.

## 2017-08-21 NOTE — Telephone Encounter (Signed)
Patient returning a call. °

## 2017-08-21 NOTE — Telephone Encounter (Signed)
Spoke with patient, scheduled for OV today at 4:15pm with Dr. Hyacinth Meeker. Patient is agreeable to date and time.  Routing to provider for final review. Patient is agreeable to disposition. Will close encounter.

## 2017-08-21 NOTE — Telephone Encounter (Signed)
Patient returning your call.  States she is available at [phone] to speak with you.

## 2017-08-21 NOTE — Patient Instructions (Signed)
Antiphospholipid antibody syndrome Chromosomal studies possibly both of you

## 2017-08-22 ENCOUNTER — Other Ambulatory Visit: Payer: Self-pay | Admitting: *Deleted

## 2017-08-22 ENCOUNTER — Other Ambulatory Visit: Payer: Self-pay | Admitting: Obstetrics & Gynecology

## 2017-08-22 DIAGNOSIS — Z1231 Encounter for screening mammogram for malignant neoplasm of breast: Secondary | ICD-10-CM

## 2017-08-22 DIAGNOSIS — Z1501 Genetic susceptibility to malignant neoplasm of breast: Secondary | ICD-10-CM

## 2017-08-22 DIAGNOSIS — Z1509 Genetic susceptibility to other malignant neoplasm: Principal | ICD-10-CM

## 2017-08-22 LAB — BETA HCG QUANT (REF LAB): hCG Quant: 81 m[IU]/mL

## 2017-08-28 ENCOUNTER — Telehealth: Payer: Self-pay | Admitting: Obstetrics & Gynecology

## 2017-08-28 ENCOUNTER — Ambulatory Visit: Payer: BC Managed Care – PPO | Admitting: Obstetrics & Gynecology

## 2017-08-28 ENCOUNTER — Other Ambulatory Visit (INDEPENDENT_AMBULATORY_CARE_PROVIDER_SITE_OTHER): Payer: BC Managed Care – PPO

## 2017-08-28 DIAGNOSIS — O039 Complete or unspecified spontaneous abortion without complication: Secondary | ICD-10-CM

## 2017-08-28 NOTE — Telephone Encounter (Signed)
Patient was in for lab appointment and wanted to check the status of an MRI appointment.

## 2017-08-29 LAB — BETA HCG QUANT (REF LAB): hCG Quant: 3 m[IU]/mL

## 2017-08-29 NOTE — Telephone Encounter (Signed)
Call placed to [facility] to check status of breast MRI referral.  I have spoken with Drenda FreezeFran at [facility], she advised they contacted the patient and left a voicemail requesting a return call to discuss scheduling requirements for breast MRI, following mammogram.    I have also contacted the patient, to verify she has the information to contact [facility].  Patient confirms she received the voicemail message [facility], as well as their phone number.  Patient advises she will return their call for scheduling.  Routing to Dr Hyacinth MeekerMiller  cc: Nolen MuKaitlyn Sprague, RN

## 2017-08-30 NOTE — Telephone Encounter (Signed)
Thanks for the update.  Please just let me know when it is scheduled.  Thanks.

## 2017-09-07 ENCOUNTER — Ambulatory Visit
Admission: RE | Admit: 2017-09-07 | Discharge: 2017-09-07 | Disposition: A | Discharge status: Home / Self Care | Payer: BC Managed Care – PPO | Source: Ambulatory Visit | Attending: Obstetrics & Gynecology | Admitting: Obstetrics & Gynecology

## 2017-09-07 DIAGNOSIS — Z1231 Encounter for screening mammogram for malignant neoplasm of breast: Secondary | ICD-10-CM

## 2017-09-24 NOTE — Telephone Encounter (Signed)
Patient is scheduled for a bilateral breast MRI w/wo contrast with [facility] on 09/29/17. MRI has been authorized through [facility]. Authorization number is [id number] and is valid 09/24/17 through 10/23/17.  Routing to Dr Hyacinth MeekerMiller for final review  cc: Nolen MuKaitlyn Sprague, RN

## 2017-09-24 NOTE — Telephone Encounter (Signed)
Patient is in imaging hold. 

## 2017-09-29 ENCOUNTER — Ambulatory Visit
Admission: RE | Admit: 2017-09-29 | Discharge: 2017-09-29 | Disposition: A | Discharge status: Home / Self Care | Payer: BC Managed Care – PPO | Source: Ambulatory Visit | Attending: Obstetrics & Gynecology | Admitting: Obstetrics & Gynecology

## 2017-09-29 DIAGNOSIS — Z1501 Genetic susceptibility to malignant neoplasm of breast: Secondary | ICD-10-CM

## 2017-09-29 DIAGNOSIS — Z1509 Genetic susceptibility to other malignant neoplasm: Principal | ICD-10-CM

## 2017-09-29 MED ORDER — GADOBENATE DIMEGLUMINE 529 MG/ML IV SOLN
12.0000 mL | Freq: Once | INTRAVENOUS | Status: AC | PRN
  Administered 2017-09-29: 12 mL via INTRAVENOUS

## 2017-10-01 ENCOUNTER — Telehealth: Payer: Self-pay | Admitting: Obstetrics & Gynecology

## 2017-10-01 NOTE — Telephone Encounter (Signed)
Left detailed message on [phone], ok per current dpr. Advised as seen below per Dr. Hyacinth MeekerMiller. Return call to office with any additional questions. Will close encounter.     Notes recorded by Leda MinHamm, Imari Reen N, RN on 10/01/2017 at 1:41 PM EST Spoke with patient, will return call d/t poor cell reception. ------  Notes recorded by Jerene BearsMiller, Mary S, MD on 10/01/2017 at 12:09 PM EST Please let pt know her breast MRI was normal. Out of imaging hold/recall.

## 2017-10-01 NOTE — Telephone Encounter (Signed)
Patient stated she is returning Jill's call about her mammogram results. Will need to call back and leave a message, she is a Runner, broadcasting/film/videoteacher and will call back when she gets a chance.

## 2017-10-30 ENCOUNTER — Encounter: Payer: Self-pay | Admitting: Oncology

## 2017-10-30 ENCOUNTER — Ambulatory Visit (HOSPITAL_BASED_OUTPATIENT_CLINIC_OR_DEPARTMENT_OTHER): Payer: BC Managed Care – PPO | Admitting: Oncology

## 2017-10-30 VITALS — BP 118/71 | HR 75 | Temp 98.6°F | Resp 16 | Ht 68.0 in | Wt 130.2 lb

## 2017-10-30 DIAGNOSIS — Z8041 Family history of malignant neoplasm of ovary: Secondary | ICD-10-CM | POA: Diagnosis not present

## 2017-10-30 DIAGNOSIS — Z803 Family history of malignant neoplasm of breast: Secondary | ICD-10-CM

## 2017-10-30 DIAGNOSIS — Z1509 Genetic susceptibility to other malignant neoplasm: Secondary | ICD-10-CM | POA: Diagnosis not present

## 2017-10-30 DIAGNOSIS — Z1501 Genetic susceptibility to malignant neoplasm of breast: Secondary | ICD-10-CM

## 2017-10-30 DIAGNOSIS — R922 Inconclusive mammogram: Secondary | ICD-10-CM | POA: Diagnosis not present

## 2017-10-30 NOTE — Progress Notes (Signed)
Dominique Cannon  [phone] [phone]    The following is a corrected copy of the original 10/30/2016 dictation   ID: Dominique Cannon DOB: [birth year]  MR#: [id number]  ZDG#:[id number]  Patient Care Team: Patient, No Pcp Per as PCP - General (Dominique Cannon) Dominique Cannon, Virgie Dad, MD as Consulting Physician (Oncology) Megan Salon, MD as Consulting Physician (Gynecology) PCP: Patient, No Pcp Per SU:  OTHER MD:  CHIEF COMPLAINT: BRCA 2 positivity  CURRENT TREATMENT: Intensive monitoring   HISTORY OF PRESENT ILLNESS: From the original intake note:  Dominique Cannon has a significant family history suggesting a hereditary breast cancer syndrome and her gynecologist Dominique Cannon working with Dr. Quincy Cannon referred her for genetic evaluation. 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 The other 19 genes tested were unremarkable.  Her subsequent history is as detailed below  INTERVAL HISTORY: Dominique Cannon returns today for continuing intensive surveillance for her history of BRCA positivity.  The interval history is significant for her having had a dilatation and evacuation for a blighted ovum March 2018.  She tells me she had a miscarriage at 7 weeks in October.  She was started on an aspirin after that.  I do not see a test for the lupus anticoagulant  She had bilateral screening mammography with tomography 09/07/2017 showing the breast density to be category C.  There were no suspicious findings.  Breast MRI with and without contrast 09/29/2017 showed no suspicious findings.  REVIEW OF SYSTEMS: She does yoga and Zumba but not very severe exercise so that is not likely to be the issue with the pregnancy.  She also has not lost any weight over the last year.  A detailed review of systems today was otherwise stable  PAST MEDICAL HISTORY: Past Medical History:  Diagnosis  Date  . BRCA2 positive   . Family history of breast cancer   . Family history of breast cancer in mother    mother age 15 died of breast cancer  . Family history of ovarian cancer     PAST SURGICAL HISTORY: Past Surgical History:  Procedure Laterality Date  . DILATION AND EVACUATION N/A 01/16/2017   Procedure: DILATATION AND EVACUATION;  Surgeon: Megan Salon, MD;  Location: [facility];  Service: Gynecology;  Laterality: N/A;  . INTRAUTERINE DEVICE INSERTION  07/04/11   Mirena IUD insertion by Dr. Edwinna Cannon  . WISDOM TOOTH EXTRACTION      FAMILY HISTORY Family History  Problem Relation Age of Onset  . Breast cancer Mother 34  . Ovarian cancer Maternal Grandmother        dx in her 55s  . Breast cancer Paternal Grandmother   . Cancer Maternal Uncle        NOS  . Other Other        positive genetic testing for "the gene"  . Breast cancer Other        MGF's sister  . Breast cancer Other        MGF's mother   the patient's mother died at the age of 15 from breast cancer. The patient's mother had one sister, in good health. The patient's mother's mother has a history of ovarian cancer. One maternal great aunt also had a history of breast cancer. A second has tested positive for a gene mutation, but we do not have those records. On the paternal side there is also a history of breast cancer,  likely postmenopausal  GYNECOLOGIC HISTORY:  No LMP recorded. Menarche age 61, the patient is GX P0. She uses a Mirena IUD for contraception.   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 Her husband Dominique Cannon is also a Art therapist, and band leader. They have no children but are hoping to have a family in the next few years.    ADVANCED DIRECTIVES: Not in place   HEALTH MAINTENANCE: Social History   Tobacco Use  . Smoking status: Never Smoker  . Smokeless tobacco: Never Used  Substance Use Topics  . Alcohol use: Yes     Alcohol/week: 0.6 oz    Types: 1 Standard drinks or equivalent per week    Comment: occ  . Drug use: No     Colonoscopy:  PAP:  Bone density:  Lipid panel:  No Known Allergies  Current Outpatient Medications  Medication Sig Dispense Refill  . Prenatal Vit-Fe Fumarate-FA (PRENATAL VITAMIN PO) Take by mouth daily.     No current facility-administered medications for this visit.     OBJECTIVE: Young white woman who appears well Vitals:   10/30/17 1527  BP: 118/71  Pulse: 75  Resp: 16  Temp: 98.6 F (37 C)  SpO2: 100%     Body mass index is 19.8 kg/m.    ECOG FS:0 - Asymptomatic  Sclerae unicteric, pupils round and equal Oropharynx clear and moist No cervical or supraclavicular adenopathy Lungs no rales or rhonchi Heart regular rate and rhythm Abd soft, nontender, positive bowel sounds MSK no focal spinal tenderness, no upper extremity lymphedema Neuro: nonfocal, well oriented, appropriate affect Breasts: I do not palpate a mass in either breast.  Both axillae are   LAB RESULTS:  CMP  No results found for: NA, K, CL, CO2, GLUCOSE, BUN, CREATININE, CALCIUM, PROT, ALBUMIN, AST, ALT, ALKPHOS, BILITOT, GFRNONAA, GFRAA  INo results found for: SPEP, UPEP  Lab Results  Component Value Date   WBC 7.8 01/16/2017   HGB 12.9 01/16/2017   HCT 38.2 01/16/2017   MCV 89.3 01/16/2017   PLT 297 01/16/2017      Chemistry   No results found for: NA, K, CL, CO2, BUN, CREATININE, GLU No results found for: CALCIUM, ALKPHOS, AST, ALT, BILITOT     No results found for: LABCA2  No components found for: UYEBX435  No results for input(s): INR in the last 168 hours.  Urinalysis No results found for: COLORURINE, APPEARANCEUR, LABSPEC, PHURINE, GLUCOSEU, HGBUR, BILIRUBINUR, KETONESUR, PROTEINUR, UROBILINOGEN, NITRITE, LEUKOCYTESUR  STUDIES: No results found.  ASSESSMENT: 31 y.o. BRCA 2 positive [place], [place] woman with brest density category D   (1) patient has decided  against bilateral mastectomies and BSO, at least until child-bearing and nursing are complete  (2) patient is not planning to start tamoxifen at this point  (3) patient is considering OCPs for birth control and ovarian cancer risk-reduction  (4) breast cancer screening: yearly mammogram and MRI, preferably 6 months apart; biannual exams  (5) ovarian cancer screening: pelvic US every 6 months with CA 125  PLAN: Dominique Cannon has had 2 miscarriages in the past year but she seems to be doing everything right as best as I can tell.  I offered to test her for the lupus anticoagulant but she wanted to wait on that.  She has been started on an aspirin, which may help.  Of course if she did carry the lupus anticoagulant we would consider low-dose Lovenox once pregnancy was  detected.  Her ovarian cancer screening is per Dr. Sabra Heck.  The breast cancer screening will be mammography in May 2019 and breast MRI November 2019.  She will then see me a year from now.  She knows these tests need to be scheduled so that ideally they are take an right after her period  She knows to call for any problems that may develop before the next visit    .Chauncey Cruel, MD   10/30/2017 3:29 PM Medical Oncology and Hematology [facility] [address] [place], [place] [address] Tel. [phone]    Fax. [phone]

## 2017-10-31 ENCOUNTER — Telehealth: Payer: Self-pay | Admitting: Oncology

## 2017-10-31 NOTE — Telephone Encounter (Signed)
Spoke to patient regarding upcoming December 2019 appointments.  °

## 2017-11-13 NOTE — L&D Delivery Note (Addendum)
Patient is 32 y.o. X8P3825 [redacted]w[redacted]d admitted with SOL.   Delivery Note At 3:19 PM a viable female was delivered via Vaginal, Spontaneous (Presentation: cephalic; direct OA ).  APGAR: 8, 9; weight pending.   Placenta status: spontaneous, intact.  Cord: 3vc   Anesthesia:  No epidural Episiotomy: None Lacerations: 2nd degree;Labial Suture Repair: 3.0 vicryl 4-0, vicryl rapide Est. Blood Loss (mL):  400 mL  Mom to postpartum.  Baby to Couplet care / Skin to Skin.  Upon arrival patient was complete and pushing. She pushed with good maternal effort to deliver a healthy baby boy. Delivery was complicated by a 30 second shoulder dystocia.  McRoberts maneuver was immediately started, followed by deliver of the posterior arm. Baby  was noted to have good tone and place on maternal abdomen for oral suctioning, drying and stimulation. Delayed cord clamping performed. Placenta delivered intact with 3V cord.Pitocin was started and uterus massaged until bleeding slowed.  Vaginal canal and perineum was inspected and found to have a second degree perineal laceration in addition to a right labial laceration. A 3-0 vicryl was used to repair the perineal laceration and a 4-0 vicryl rapide was used to repair the right labial laceration. The lacerations were hemostatic following repair. Counts of sharps, instruments, and lap pads were all correct.   Mirian Mo, MD PGY-1 9/11/20194:14 PM  I was gloved and present for the delivery in it's entirety. After delivery of head, CNM took over maneuvers to deliver infant after identification of shoulder dystocia. Total time of dystocia approximately 30 seconds.   Rolm Bookbinder, CNM 07/24/18 4:39 PM

## 2017-11-13 NOTE — L&D Delivery Note (Deleted)
Post-Delivery Procedure Note  I was present post-delivery and asked to assist with laceration repair. Patient had a second degree perineal laceration with a right labial tear. Repair was performed in the usual fashion using 3-0 Vicryl and 4-0 Rapide. Approximately 25ml of lidocaine used for analgesia. Good hemostasis was noted after completion of the repair.    Khiree Bukhari S. Natale Thoma, DO OB/GYN Fellow  

## 2017-11-14 ENCOUNTER — Telehealth: Payer: Self-pay | Admitting: Obstetrics & Gynecology

## 2017-11-14 DIAGNOSIS — Z3201 Encounter for pregnancy test, result positive: Secondary | ICD-10-CM

## 2017-11-14 NOTE — Telephone Encounter (Signed)
Left message to call Whittley Carandang at [phone].  

## 2017-11-14 NOTE — Telephone Encounter (Signed)
Spoke with patient, LMP 10/17/17, positive UPT on 11/12/17. Recommended OV earlier given Hx of SAB.  Advised patient will review with Dr. Hyacinth MeekerMiller, patient placed on brief hold.  Dr. Hyacinth MeekerMiller spoke with patient, patient agreeable with plan as seen below.    1. Lab, STAT Hcg, scheduled for 11/15/17 at 4:15pm. Future order placed.   2. Repeat Hcg scheduled for 11/19/17 at 3:30pm.   3. Will plan ultrasound based on lab results.   Patient verbalizes understanding and is agreeable. OV cancelled for 11/26/17.  Routing to provider for final review. Patient is agreeable to disposition. Will close encounter.

## 2017-11-14 NOTE — Telephone Encounter (Signed)
Patient had a positive UPT earlier this week.  Requested to see only Dr Hyacinth MeekerMiller.  Patient scheduled for 11/26/17 per request but will come in earlier if Dr Hyacinth MeekerMiller thinks she needs to.

## 2017-11-15 ENCOUNTER — Other Ambulatory Visit (INDEPENDENT_AMBULATORY_CARE_PROVIDER_SITE_OTHER): Payer: BC Managed Care – PPO

## 2017-11-15 DIAGNOSIS — Z3201 Encounter for pregnancy test, result positive: Secondary | ICD-10-CM

## 2017-11-15 LAB — BETA HCG QUANT (REF LAB): hCG Quant: 656 m[IU]/mL

## 2017-11-19 ENCOUNTER — Telehealth: Payer: Self-pay | Admitting: *Deleted

## 2017-11-19 ENCOUNTER — Other Ambulatory Visit (INDEPENDENT_AMBULATORY_CARE_PROVIDER_SITE_OTHER): Payer: BC Managed Care – PPO

## 2017-11-19 ENCOUNTER — Other Ambulatory Visit: Payer: Self-pay | Admitting: Obstetrics & Gynecology

## 2017-11-19 DIAGNOSIS — N912 Amenorrhea, unspecified: Secondary | ICD-10-CM

## 2017-11-19 DIAGNOSIS — Z3201 Encounter for pregnancy test, result positive: Secondary | ICD-10-CM

## 2017-11-19 DIAGNOSIS — Z8759 Personal history of other complications of pregnancy, childbirth and the puerperium: Secondary | ICD-10-CM

## 2017-11-19 LAB — BETA HCG QUANT (REF LAB): hCG Quant: 2753 m[IU]/mL

## 2017-11-19 NOTE — Telephone Encounter (Signed)
Call to patient. Advised of appointment 11-29-17 at 430 pm.   Encounter closed.

## 2017-11-19 NOTE — Addendum Note (Signed)
Addended by: Loreta AveMORALES, REINA C on: 11/19/2017 03:16 PM   Modules accepted: Orders

## 2017-11-19 NOTE — Telephone Encounter (Signed)
Called pt personally.  Advised HCG looks good.  Will plan ultrasound on Thursday, 11/29/17.  Needs latest appt possible.  4:30pm is fine with me if ok with Gerri.

## 2017-11-19 NOTE — Telephone Encounter (Signed)
Annice PihJackie calling STAT labs  Hcg Quant: 2,753  Will fax copy of results to office at [phone].  Routing to Dr. Hyacinth MeekerMiller

## 2017-11-19 NOTE — Progress Notes (Signed)
hcg 

## 2017-11-26 ENCOUNTER — Ambulatory Visit: Payer: BC Managed Care – PPO | Admitting: Obstetrics & Gynecology

## 2017-11-26 ENCOUNTER — Other Ambulatory Visit: Payer: Self-pay | Admitting: *Deleted

## 2017-11-26 DIAGNOSIS — Z8759 Personal history of other complications of pregnancy, childbirth and the puerperium: Secondary | ICD-10-CM

## 2017-11-29 ENCOUNTER — Other Ambulatory Visit: Payer: Self-pay | Admitting: Obstetrics & Gynecology

## 2017-11-29 ENCOUNTER — Encounter: Payer: Self-pay | Admitting: Obstetrics & Gynecology

## 2017-11-29 ENCOUNTER — Ambulatory Visit: Payer: BC Managed Care – PPO | Admitting: Obstetrics & Gynecology

## 2017-11-29 ENCOUNTER — Ambulatory Visit (INDEPENDENT_AMBULATORY_CARE_PROVIDER_SITE_OTHER): Payer: BC Managed Care – PPO

## 2017-11-29 VITALS — BP 102/66 | HR 80 | Resp 16 | Ht 68.0 in | Wt 128.0 lb

## 2017-11-29 DIAGNOSIS — Z8759 Personal history of other complications of pregnancy, childbirth and the puerperium: Secondary | ICD-10-CM | POA: Diagnosis not present

## 2017-11-29 DIAGNOSIS — N912 Amenorrhea, unspecified: Secondary | ICD-10-CM

## 2017-11-29 NOTE — Progress Notes (Deleted)
Subjective:     Patient ID: Dominique Cannon, female   DOB: 06/21/1986, 32 y.o.   MRN: [id number]  HPI   Review of Systems     Objective:   Physical Exam     Assessment:     ***    Plan:     ***

## 2017-11-29 NOTE — Progress Notes (Signed)
32 y.o. 683P0A2 Married Caucasian female here for pelvic ultrasound due to assess viability.  Pt has two prior miscarries, one was a blyted ovum.  She is anxious about ultrasound today.  Having some nausea and breast tenderness.  Also has noted fatigue.  Denies pelvic pain or vaginal bleeding.    Patient's last menstrual period was 10/17/2017.  EGA by LMP is 6 1/7 week  Findings:  UTERUS: singleton IUD with gestational sac, yolk sac, +FCA at 115 BPM noted.  6 2/7 weeks by ultrasound.  BOE is by LMP:  07/24/18. ADNEXA: Left ovary and right ovary are normal with small corpus luteal cyst noted on right ovary. CERVIX:  Long and closed CUL DE SAC: no free fluid noted  Discussion:  Tdap unsure.  Will be updated in pregnancy.  Had chicken pox.  Aware flu vaccine safe and recommended.  Knows not to change kitty litter--has no cats in the home  Fish/shellfish/mercury discuss.  Patient does not eat much fish.  Unpasteurized cheese/juices discussed.  Nitrites in foods disucssed.  Exercise and intercourse discussed.  Fetal DNA particle testing discussed.  First trimester down's testing discussed.  Cystic fibrosis discussed.    Assessment:  Singleton viable IUP noted on ultrasound  Plan:  Transfer of care appropriate at this time.  Options for care reviewed.  She will stop baby ASA at 8 weeks.    ~15 minutes spent with patient >50% of time was in face to face discussion of above.

## 2017-12-02 ENCOUNTER — Encounter: Payer: Self-pay | Admitting: Obstetrics & Gynecology

## 2017-12-03 ENCOUNTER — Telehealth: Payer: Self-pay | Admitting: Radiology

## 2017-12-03 ENCOUNTER — Telehealth: Payer: Self-pay | Admitting: Obstetrics & Gynecology

## 2017-12-03 NOTE — Telephone Encounter (Signed)
Routing to Dr.Miller as Lorain ChildesFYI. Routing to [employer]ys[employer]reta Williams for sending of records. Encounter closed.

## 2017-12-03 NOTE — Telephone Encounter (Signed)
Left message on the cell phone voicemail to call [facility] to schedule New OB appointment, patient left message thru on call center. Office number provided for call back.

## 2017-12-03 NOTE — Telephone Encounter (Signed)
Patient has scheduled her OBGYN appointment for 12/26/17 at [facility].

## 2017-12-26 ENCOUNTER — Ambulatory Visit (INDEPENDENT_AMBULATORY_CARE_PROVIDER_SITE_OTHER): Payer: BC Managed Care – PPO | Admitting: Family Medicine

## 2017-12-26 ENCOUNTER — Encounter: Payer: Self-pay | Admitting: Family Medicine

## 2017-12-26 DIAGNOSIS — N96 Recurrent pregnancy loss: Secondary | ICD-10-CM | POA: Insufficient documentation

## 2017-12-26 DIAGNOSIS — Z3481 Encounter for supervision of other normal pregnancy, first trimester: Secondary | ICD-10-CM

## 2017-12-26 DIAGNOSIS — Z3687 Encounter for antenatal screening for uncertain dates: Secondary | ICD-10-CM

## 2017-12-26 DIAGNOSIS — Z3480 Encounter for supervision of other normal pregnancy, unspecified trimester: Secondary | ICD-10-CM | POA: Insufficient documentation

## 2017-12-26 NOTE — Progress Notes (Signed)
PRENATAL VISIT NOTE  Subjective:  Dominique Cannon is a 31 y.o. G3P0020 at [redacted]w[redacted]d being seen today for ongoing prenatal care.  She is currently monitored for the following issues for this low-risk pregnancy and has Family history of breast cancer; Family history of ovarian cancer; Genetic testing; BRCA2 positive; Supervision of other normal pregnancy, antepartum; and History of recurrent miscarriages on their problem list.  Patient reports no complaints.  Contractions: Not present. Vag. Bleeding: None.  Movement: Absent. Denies leaking of fluid.   The following portions of the patient's history were reviewed and updated as appropriate: allergies, current medications, past family history, past medical history, past social history, past surgical history and problem list. Problem list updated.  Objective:   Vitals:   12/26/17 1535  BP: 121/73  Pulse: 97  Weight: 130 lb (59 kg)    Fetal Status:     Movement: Absent     General:  Alert, oriented and cooperative. Patient is in no acute distress.  Skin: Skin is warm and dry. No rash noted.   Cardiovascular: Normal heart rate noted  Respiratory: Normal respiratory effort, no problems with respiration noted  Abdomen: Soft, gravid, appropriate for gestational age.  Pain/Pressure: Absent     Pelvic: Cervical exam deferred        Extremities: Normal range of motion.     Mental Status:  Normal mood and affect. Normal behavior. Normal judgment and thought content.   Assessment and Plan:  Pregnancy: G3P0020 at [redacted]w[redacted]d  1. Supervision of other normal pregnancy, antepartum - Reviewed group practice including OB, Fam med, CNM, residents and students - Obstetric Panel, Including HIV - Culture, OB Urine - GC/Chlamydia probe amp ([place])not at [facility] - Genetic Screening - Hemoglobinopathy evaluation - Cystic Fibrosis Mutation 97 - SMN1 COPY NUMBER ANALYSIS (SMA Carrier Screen) - US bedside; Future - Babyscripts Schedule Optimization - unsure  about FIRST screening-- will research and call if desired  2. History of recurrent miscarriages - was taking ASA - Lupus anticoagulant panel  Preterm labor symptoms and general obstetric precautions including but not limited to vaginal bleeding, contractions, leaking of fluid and fetal movement were reviewed in detail with the patient. Please refer to After Visit Summary for other counseling recommendations.  No Follow-up on file.   Kimberly Niles Newton, MD  

## 2017-12-26 NOTE — Progress Notes (Signed)
DATING AND VIABILITY SONOGRAM   Dominique Cannon is a 32 y.o. year old G3P0020 with LMP Patient's last menstrual period was 10/17/2017. which would correlate to  6838w0d weeks gestation.  She has regular menstrual cycles.   She is here today for a confirmatory initial sonogram.    GESTATION: SINGLETON     FETAL ACTIVITY:          Heart rate - 162bpm          The fetus is active.    GESTATIONAL AGE AND  BIOMETRICS:  Gestational criteria: Estimated Date of Delivery: 07/24/18 by LMP now at 3238w0d  Previous Scans:0 CROWN RUMP LENGTH           3.82cm         10.5 weeks                                                   AVERAGE EGA(BY THIS SCAN):  10.5 weeks  WORKING EDD( LMP ):  07/24/18     TECHNICIAN COMMENTS:  SLIUP measuring 10.5 wks by CRL with FHR 162bpm   A copy of this report including all images has been saved and backed up to a second source for retrieval if needed. All measures and details of the anatomical scan, placentation, fluid volume and pelvic anatomy are contained in that report.  Lindsey Demonte 12/26/2017 4:16 PM

## 2017-12-28 LAB — URINE CULTURE, OB REFLEX

## 2017-12-28 LAB — CULTURE, OB URINE

## 2018-01-07 DIAGNOSIS — N96 Recurrent pregnancy loss: Secondary | ICD-10-CM

## 2018-01-07 DIAGNOSIS — Z3480 Encounter for supervision of other normal pregnancy, unspecified trimester: Secondary | ICD-10-CM

## 2018-01-09 ENCOUNTER — Ambulatory Visit (INDEPENDENT_AMBULATORY_CARE_PROVIDER_SITE_OTHER): Payer: BC Managed Care – PPO | Admitting: Student

## 2018-01-09 VITALS — BP 102/67 | HR 63 | Wt 135.0 lb

## 2018-01-09 DIAGNOSIS — Z3480 Encounter for supervision of other normal pregnancy, unspecified trimester: Secondary | ICD-10-CM

## 2018-01-09 DIAGNOSIS — Z3481 Encounter for supervision of other normal pregnancy, first trimester: Secondary | ICD-10-CM

## 2018-01-09 DIAGNOSIS — N96 Recurrent pregnancy loss: Secondary | ICD-10-CM

## 2018-01-09 NOTE — Progress Notes (Signed)
Patient ID: Dominique Cannon, female   DOB: Feb 16, 1986, 32 y.o.   MRN: [id number]   PRENATAL VISIT NOTE  Subjective:  Dominique Cannon is a 32 y.o. G3P0020 at 33w0dbeing seen today for ongoing prenatal care.  She is currently monitored for the following issues for this low-risk pregnancy and has Family history of breast cancer; Family history of ovarian cancer; Genetic testing; BRCA2 positive; Supervision of other normal pregnancy, antepartum; and History of recurrent miscarriages on their problem list.  Patient reports no complaints.  Contractions: Not present. Vag. Bleeding: None.  Movement: Absent. Denies leaking of fluid.   The following portions of the patient's history were reviewed and updated as appropriate: allergies, current medications, past family history, past medical history, past social history, past surgical history and problem list. Problem list updated.  Objective:   Vitals:   01/09/18 1518  BP: 102/67  Pulse: 63  Weight: 135 lb (61.2 kg)    Fetal Status: Fetal Heart Rate (bpm): 145   Movement: Absent     General:  Alert, oriented and cooperative. Patient is in no acute distress.  Skin: Skin is warm and dry. No rash noted.   Cardiovascular: Normal heart rate noted  Respiratory: Normal respiratory effort, no problems with respiration noted  Abdomen: Soft, gravid, appropriate for gestational age.  Pain/Pressure: Absent     Pelvic: Cervical exam deferred        Extremities: Normal range of motion.  Edema: None  Mental Status:  Normal mood and affect. Normal behavior. Normal judgment and thought content.   Assessment and Plan:  Pregnancy: G3P0020 at 139w0d1. Supervision of other normal pregnancy, antepartum Patient doing well; declined genetic testing (SMA, CF, Hgb, NIPS). Other NOB labs drawn today.  - [place] OB DETAIL +14 WK; Future  2. History of recurrent miscarriages FHR by [place]; patient greatly relieved to see heartbeat and observe movement on the monitor.    Preterm labor symptoms and general obstetric precautions including but not limited to vaginal bleeding, contractions, leaking of fluid and fetal movement were reviewed in detail with the patient. Please refer to After Visit Summary for other counseling recommendations.  Return in about 4 weeks (around 02/06/2018), or LROB and please schedule anatomy scan.   KaStarr LakeCNM

## 2018-01-09 NOTE — Patient Instructions (Signed)

## 2018-01-09 NOTE — Addendum Note (Signed)
Addended by: Arne ClevelandHUTCHINSON, MANDY J on: 01/09/2018 03:50 PM   Modules accepted: Orders

## 2018-01-09 NOTE — Progress Notes (Signed)
Declines genetic testing.  

## 2018-01-10 LAB — OBSTETRIC PANEL, INCLUDING HIV
Antibody Screen: NEGATIVE
BASOS ABS: 0 10*3/uL (ref 0.0–0.2)
Basos: 0 %
EOS (ABSOLUTE): 0.1 10*3/uL (ref 0.0–0.4)
EOS: 1 %
HIV SCREEN 4TH GENERATION: NONREACTIVE
Hematocrit: 35.9 % (ref 34.0–46.6)
Hemoglobin: 12.6 g/dL (ref 11.1–15.9)
Hepatitis B Surface Ag: NEGATIVE
IMMATURE GRANULOCYTES: 0 %
Immature Grans (Abs): 0 10*3/uL (ref 0.0–0.1)
LYMPHS ABS: 1.7 10*3/uL (ref 0.7–3.1)
Lymphs: 18 %
MCH: 31.3 pg (ref 26.6–33.0)
MCHC: 35.1 g/dL (ref 31.5–35.7)
MCV: 89 fL (ref 79–97)
MONOS ABS: 0.5 10*3/uL (ref 0.1–0.9)
Monocytes: 5 %
NEUTROS PCT: 76 %
Neutrophils Absolute: 7.5 10*3/uL — ABNORMAL HIGH (ref 1.4–7.0)
PLATELETS: 252 10*3/uL (ref 150–379)
RBC: 4.03 x10E6/uL (ref 3.77–5.28)
RDW: 13.7 % (ref 12.3–15.4)
RH TYPE: POSITIVE
RPR Ser Ql: NONREACTIVE
Rubella Antibodies, IGG: 2.45 index (ref >0.99)
WBC: 9.7 10*3/uL (ref 3.4–10.8)

## 2018-02-06 ENCOUNTER — Ambulatory Visit (INDEPENDENT_AMBULATORY_CARE_PROVIDER_SITE_OTHER): Payer: BC Managed Care – PPO | Admitting: Nurse Practitioner

## 2018-02-06 VITALS — BP 107/72 | HR 72 | Wt 135.4 lb

## 2018-02-06 DIAGNOSIS — N96 Recurrent pregnancy loss: Secondary | ICD-10-CM

## 2018-02-06 DIAGNOSIS — Z3480 Encounter for supervision of other normal pregnancy, unspecified trimester: Secondary | ICD-10-CM

## 2018-02-06 NOTE — Patient Instructions (Signed)
Second Trimester of Pregnancy The second trimester is from week 13 through week 28, month 4 through 6. This is often the time in pregnancy that you feel your best. Often times, morning sickness has lessened or quit. You may have more energy, and you may get hungry more often. Your unborn baby (fetus) is growing rapidly. At the end of the sixth month, he or she is about 9 inches long and weighs about 1 pounds. You will likely feel the baby move (quickening) between 18 and 20 weeks of pregnancy. Follow these instructions at home:  Avoid all smoking, herbs, and alcohol. Avoid drugs not approved by your doctor.  Do not use any tobacco products, including cigarettes, chewing tobacco, and electronic cigarettes. If you need help quitting, ask your doctor. You may get counseling or other support to help you quit.  Only take medicine as told by your doctor. Some medicines are safe and some are not during pregnancy.  Exercise only as told by your doctor. Stop exercising if you start having cramps.  Eat regular, healthy meals.  Wear a good support bra if your breasts are tender.  Do not use hot tubs, steam rooms, or saunas.  Wear your seat belt when driving.  Avoid raw meat, uncooked cheese, and liter boxes and soil used by cats.  Take your prenatal vitamins.  Take 1500-2000 milligrams of calcium daily starting at the 20th week of pregnancy until you deliver your baby.  Try taking medicine that helps you poop (stool softener) as needed, and if your doctor approves. Eat more fiber by eating fresh fruit, vegetables, and whole grains. Drink enough fluids to keep your pee (urine) clear or pale yellow.  Take warm water baths (sitz baths) to soothe pain or discomfort caused by hemorrhoids. Use hemorrhoid cream if your doctor approves.  If you have puffy, bulging veins (varicose veins), wear support hose. Raise (elevate) your feet for 15 minutes, 3-4 times a day. Limit salt in your diet.  Avoid heavy  lifting, wear low heals, and sit up straight.  Rest with your legs raised if you have leg cramps or low back pain.  Visit your dentist if you have not gone during your pregnancy. Use a soft toothbrush to brush your teeth. Be gentle when you floss.  You can have sex (intercourse) unless your doctor tells you not to.  Go to your doctor visits. Get help if:  You feel dizzy.  You have mild cramps or pressure in your lower belly (abdomen).  You have a nagging pain in your belly area.  You continue to feel sick to your stomach (nauseous), throw up (vomit), or have watery poop (diarrhea).  You have bad smelling fluid coming from your vagina.  You have pain with peeing (urination). Get help right away if:  You have a fever.  You are leaking fluid from your vagina.  You have spotting or bleeding from your vagina.  You have severe belly cramping or pain.  You lose or gain weight rapidly.  You have trouble catching your breath and have chest pain.  You notice sudden or extreme puffiness (swelling) of your face, hands, ankles, feet, or legs.  You have not felt the baby move in over an hour.  You have severe headaches that do not go away with medicine.  You have vision changes. This information is not intended to replace advice given to you by your health care provider. Make sure you discuss any questions you have with your health care   provider. Document Released: 01/24/2010 Document Revised: 04/06/2016 Document Reviewed: 12/31/2012 Elsevier Interactive Patient Education  2017 [employer]  

## 2018-02-06 NOTE — Progress Notes (Signed)
    Subjective:  Dominique Cannon is a 32 y.o. G3P0020 at 75w0dbeing seen today for ongoing prenatal care.  She is currently monitored for the following issues for this low-risk pregnancy and has Family history of breast cancer; Family history of ovarian cancer; Genetic testing; BRCA2 positive; Supervision of other normal pregnancy, antepartum; and History of recurrent miscarriages on their problem list.  Patient reports no problems..  Contractions: Not present.  .  Movement: Absent. Denies leaking of fluid.   The following portions of the patient's history were reviewed and updated as appropriate: allergies, current medications, past family history, past medical history, past social history, past surgical history and problem list. Problem list updated.  Objective:   Vitals:   02/06/18 1523  BP: 107/72  Pulse: 72  Weight: 135 lb 6.4 oz (61.4 kg)    Fetal Status: Fetal Heart Rate (bpm): 137 Fundal Height: 17 cm Movement: Absent     General:  Alert, oriented and cooperative. Patient is in no acute distress.  Skin: Skin is warm and dry. No rash noted.   Cardiovascular: Normal heart rate noted  Respiratory: Normal respiratory effort, no problems with respiration noted  Abdomen: Soft, gravid, appropriate for gestational age. Pain/Pressure: Absent     Pelvic:  Cervical exam deferred        Extremities: Normal range of motion.     Mental Status: Normal mood and affect. Normal behavior. Normal judgment and thought content.   Urinalysis:    NA  Assessment and Plan:  Pregnancy: G3P0020 at 17w0d1. Supervision of other normal pregnancy, antepartum Encouraged drinking of water. Return in 4 weeks Declines quad screen Has appointment for Ultrasound Reviewed birthing classes, breastfeeding classes and contraceptive options.   Will need to review contraceptive options with her oncologist as she is BRCA2 positive.  - [place] OB DETAIL +14 WK; Future  2. History of recurrent miscarriages  -  [place] OB DETAIL +14 WK; Future  Preterm labor symptoms and general obstetric precautions including but not limited to vaginal bleeding, contractions, leaking of fluid and fetal movement were reviewed in detail with the patient. Please refer to After Visit Summary for other counseling recommendations.  Return in about 1 month (around 03/06/2018).  TEEarlie ServerRN, MSN, NP-BC Nurse Practitioner, FaFayetteville [facility] LLCor WoDe[facility] [employer] CompanyCoBlanchardroup 02/06/2018 3:46 PM

## 2018-03-06 ENCOUNTER — Ambulatory Visit (INDEPENDENT_AMBULATORY_CARE_PROVIDER_SITE_OTHER): Payer: BC Managed Care – PPO | Admitting: Family Medicine

## 2018-03-06 VITALS — BP 103/67 | HR 72 | Wt 141.0 lb

## 2018-03-06 DIAGNOSIS — Z3480 Encounter for supervision of other normal pregnancy, unspecified trimester: Secondary | ICD-10-CM

## 2018-03-06 DIAGNOSIS — Z3482 Encounter for supervision of other normal pregnancy, second trimester: Secondary | ICD-10-CM

## 2018-03-06 NOTE — Patient Instructions (Signed)
 Second Trimester of Pregnancy The second trimester is from week 14 through week 27 (months 4 through 6). The second trimester is often a time when you feel your best. Your body has adjusted to being pregnant, and you begin to feel better physically. Usually, morning sickness has lessened or quit completely, you may have more energy, and you may have an increase in appetite. The second trimester is also a time when the fetus is growing rapidly. At the end of the sixth month, the fetus is about 9 inches long and weighs about 1 pounds. You will likely begin to feel the baby move (quickening) between 16 and 20 weeks of pregnancy. Body changes during your second trimester Your body continues to go through many changes during your second trimester. The changes vary from woman to woman.  Your weight will continue to increase. You will notice your lower abdomen bulging out.  You may begin to get stretch marks on your hips, abdomen, and breasts.  You may develop headaches that can be relieved by medicines. The medicines should be approved by your health care provider.  You may urinate more often because the fetus is pressing on your bladder.  You may develop or continue to have heartburn as a result of your pregnancy.  You may develop constipation because certain hormones are causing the muscles that push waste through your intestines to slow down.  You may develop hemorrhoids or swollen, bulging veins (varicose veins).  You may have back pain. This is caused by: ? Weight gain. ? Pregnancy hormones that are relaxing the joints in your pelvis. ? A shift in weight and the muscles that support your balance.  Your breasts will continue to grow and they will continue to become tender.  Your gums may bleed and may be sensitive to brushing and flossing.  Dark spots or blotches (chloasma, mask of pregnancy) may develop on your face. This will likely fade after the baby is born.  A dark line from  your belly button to the pubic area (linea nigra) may appear. This will likely fade after the baby is born.  You may have changes in your hair. These can include thickening of your hair, rapid growth, and changes in texture. Some women also have hair loss during or after pregnancy, or hair that feels dry or thin. Your hair will most likely return to normal after your baby is born.  What to expect at prenatal visits During a routine prenatal visit:  You will be weighed to make sure you and the fetus are growing normally.  Your blood pressure will be taken.  Your abdomen will be measured to track your baby's growth.  The fetal heartbeat will be listened to.  Any test results from the previous visit will be discussed.  Your health care provider may ask you:  How you are feeling.  If you are feeling the baby move.  If you have had any abnormal symptoms, such as leaking fluid, bleeding, severe headaches, or abdominal cramping.  If you are using any tobacco products, including cigarettes, chewing tobacco, and electronic cigarettes.  If you have any questions.  Other tests that may be performed during your second trimester include:  Blood tests that check for: ? Low iron levels (anemia). ? High blood sugar that affects pregnant women (gestational diabetes) between 24 and 28 weeks. ? Rh antibodies. This is to check for a protein on red blood cells (Rh factor).  Urine tests to check for infections, diabetes,   or protein in the urine.  An ultrasound to confirm the proper growth and development of the baby.  An amniocentesis to check for possible genetic problems.  Fetal screens for spina bifida and Down syndrome.  HIV (human immunodeficiency virus) testing. Routine prenatal testing includes screening for HIV, unless you choose not to have this test.  Follow these instructions at home: Medicines  Follow your health care provider's instructions regarding medicine use. Specific  medicines may be either safe or unsafe to take during pregnancy.  Take a prenatal vitamin that contains at least 600 micrograms (mcg) of folic acid.  If you develop constipation, try taking a stool softener if your health care provider approves. Eating and drinking  Eat a balanced diet that includes fresh fruits and vegetables, whole grains, good sources of protein such as meat, eggs, or tofu, and low-fat dairy. Your health care provider will help you determine the amount of weight gain that is right for you.  Avoid raw meat and uncooked cheese. These carry germs that can cause birth defects in the baby.  If you have low calcium intake from food, talk to your health care provider about whether you should take a daily calcium supplement.  Limit foods that are high in fat and processed sugars, such as fried and sweet foods.  To prevent constipation: ? Drink enough fluid to keep your urine clear or pale yellow. ? Eat foods that are high in fiber, such as fresh fruits and vegetables, whole grains, and beans. Activity  Exercise only as directed by your health care provider. Most women can continue their usual exercise routine during pregnancy. Try to exercise for 30 minutes at least 5 days a week. Stop exercising if you experience uterine contractions.  Avoid heavy lifting, wear low heel shoes, and practice good posture.  A sexual relationship may be continued unless your health care provider directs you otherwise. Relieving pain and discomfort  Wear a good support bra to prevent discomfort from breast tenderness.  Take warm sitz baths to soothe any pain or discomfort caused by hemorrhoids. Use hemorrhoid cream if your health care provider approves.  Rest with your legs elevated if you have leg cramps or low back pain.  If you develop varicose veins, wear support hose. Elevate your feet for 15 minutes, 3-4 times a day. Limit salt in your diet. Prenatal Care  Write down your questions.  Take them to your prenatal visits.  Keep all your prenatal visits as told by your health care provider. This is important. Safety  Wear your seat belt at all times when driving.  Make a list of emergency phone numbers, including numbers for family, friends, the hospital, and police and fire departments. General instructions  Ask your health care provider for a referral to a local prenatal education class. Begin classes no later than the beginning of month 6 of your pregnancy.  Ask for help if you have counseling or nutritional needs during pregnancy. Your health care provider can offer advice or refer you to specialists for help with various needs.  Do not use hot tubs, steam rooms, or saunas.  Do not douche or use tampons or scented sanitary pads.  Do not cross your legs for long periods of time.  Avoid cat litter boxes and soil used by cats. These carry germs that can cause birth defects in the baby and possibly loss of the fetus by miscarriage or stillbirth.  Avoid all smoking, herbs, alcohol, and unprescribed drugs. Chemicals in these products   can affect the formation and growth of the baby.  Do not use any products that contain nicotine or tobacco, such as cigarettes and e-cigarettes. If you need help quitting, ask your health care provider.  Visit your dentist if you have not gone yet during your pregnancy. Use a soft toothbrush to brush your teeth and be gentle when you floss. Contact a health care provider if:  You have dizziness.  You have mild pelvic cramps, pelvic pressure, or nagging pain in the abdominal area.  You have persistent nausea, vomiting, or diarrhea.  You have a bad smelling vaginal discharge.  You have pain when you urinate. Get help right away if:  You have a fever.  You are leaking fluid from your vagina.  You have spotting or bleeding from your vagina.  You have severe abdominal cramping or pain.  You have rapid weight gain or weight  loss.  You have shortness of breath with chest pain.  You notice sudden or extreme swelling of your face, hands, ankles, feet, or legs.  You have not felt your baby move in over an hour.  You have severe headaches that do not go away when you take medicine.  You have vision changes. Summary  The second trimester is from week 14 through week 27 (months 4 through 6). It is also a time when the fetus is growing rapidly.  Your body goes through many changes during pregnancy. The changes vary from woman to woman.  Avoid all smoking, herbs, alcohol, and unprescribed drugs. These chemicals affect the formation and growth your baby.  Do not use any tobacco products, such as cigarettes, chewing tobacco, and e-cigarettes. If you need help quitting, ask your health care provider.  Contact your health care provider if you have any questions. Keep all prenatal visits as told by your health care provider. This is important. This information is not intended to replace advice given to you by your health care provider. Make sure you discuss any questions you have with your health care provider. Document Released: 10/24/2001 Document Revised: 12/05/2016 Document Reviewed: 12/05/2016 [employer] Interactive Patient Education  2018 [employer]   Breastfeeding Choosing to breastfeed is one of the best decisions you can make for yourself and your baby. A change in hormones during pregnancy causes your breasts to make breast milk in your milk-producing glands. Hormones prevent breast milk from being released before your baby is born. They also prompt milk flow after birth. Once breastfeeding has begun, thoughts of your baby, as well as his or her sucking or crying, can stimulate the release of milk from your milk-producing glands. Benefits of breastfeeding Research shows that breastfeeding offers many health benefits for infants and mothers. It also offers a cost-free and convenient way to feed your  baby. For your baby  Your first milk (colostrum) helps your baby's digestive system to function better.  Special cells in your milk (antibodies) help your baby to fight off infections.  Breastfed babies are less likely to develop asthma, allergies, obesity, or type 2 diabetes. They are also at lower risk for sudden infant death syndrome (SIDS).  Nutrients in breast milk are better able to meet your baby's needs compared to infant formula.  Breast milk improves your baby's brain development. For you  Breastfeeding helps to create a very special bond between you and your baby.  Breastfeeding is convenient. Breast milk costs nothing and is always available at the correct temperature.  Breastfeeding helps to burn calories. It helps you   to lose the weight that you gained during pregnancy.  Breastfeeding makes your uterus return faster to its size before pregnancy. It also slows bleeding (lochia) after you give birth.  Breastfeeding helps to lower your risk of developing type 2 diabetes, osteoporosis, rheumatoid arthritis, cardiovascular disease, and breast, ovarian, uterine, and endometrial cancer later in life. Breastfeeding basics Starting breastfeeding  Find a comfortable place to sit or lie down, with your neck and back well-supported.  Place a pillow or a rolled-up blanket under your baby to bring him or her to the level of your breast (if you are seated). Nursing pillows are specially designed to help support your arms and your baby while you breastfeed.  Make sure that your baby's tummy (abdomen) is facing your abdomen.  Gently massage your breast. With your fingertips, massage from the outer edges of your breast inward toward the nipple. This encourages milk flow. If your milk flows slowly, you may need to continue this action during the feeding.  Support your breast with 4 fingers underneath and your thumb above your nipple (make the letter "C" with your hand). Make sure your  fingers are well away from your nipple and your baby's mouth.  Stroke your baby's lips gently with your finger or nipple.  When your baby's mouth is open wide enough, quickly bring your baby to your breast, placing your entire nipple and as much of the areola as possible into your baby's mouth. The areola is the colored area around your nipple. ? More areola should be visible above your baby's upper lip than below the lower lip. ? Your baby's lips should be opened and extended outward (flanged) to ensure an adequate, comfortable latch. ? Your baby's tongue should be between his or her lower gum and your breast.  Make sure that your baby's mouth is correctly positioned around your nipple (latched). Your baby's lips should create a seal on your breast and be turned out (everted).  It is common for your baby to suck about 2-3 minutes in order to start the flow of breast milk. Latching Teaching your baby how to latch onto your breast properly is very important. An improper latch can cause nipple pain, decreased milk supply, and poor weight gain in your baby. Also, if your baby is not latched onto your nipple properly, he or she may swallow some air during feeding. This can make your baby fussy. Burping your baby when you switch breasts during the feeding can help to get rid of the air. However, teaching your baby to latch on properly is still the best way to prevent fussiness from swallowing air while breastfeeding. Signs that your baby has successfully latched onto your nipple  Silent tugging or silent sucking, without causing you pain. Infant's lips should be extended outward (flanged).  Swallowing heard between every 3-4 sucks once your milk has started to flow (after your let-down milk reflex occurs).  Muscle movement above and in front of his or her ears while sucking.  Signs that your baby has not successfully latched onto your nipple  Sucking sounds or smacking sounds from your baby while  breastfeeding.  Nipple pain.  If you think your baby has not latched on correctly, slip your finger into the corner of your baby's mouth to break the suction and place it between your baby's gums. Attempt to start breastfeeding again. Signs of successful breastfeeding Signs from your baby  Your baby will gradually decrease the number of sucks or will completely stop sucking.    Your baby will fall asleep.  Your baby's body will relax.  Your baby will retain a small amount of milk in his or her mouth.  Your baby will let go of your breast by himself or herself.  Signs from you  Breasts that have increased in firmness, weight, and size 1-3 hours after feeding.  Breasts that are softer immediately after breastfeeding.  Increased milk volume, as well as a change in milk consistency and color by the fifth day of breastfeeding.  Nipples that are not sore, cracked, or bleeding.  Signs that your baby is getting enough milk  Wetting at least 1-2 diapers during the first 24 hours after birth.  Wetting at least 5-6 diapers every 24 hours for the first week after birth. The urine should be clear or pale yellow by the age of 5 days.  Wetting 6-8 diapers every 24 hours as your baby continues to grow and develop.  At least 3 stools in a 24-hour period by the age of 5 days. The stool should be soft and yellow.  At least 3 stools in a 24-hour period by the age of 7 days. The stool should be seedy and yellow.  No loss of weight greater than 10% of birth weight during the first 3 days of life.  Average weight gain of 4-7 oz (113-198 g) per week after the age of 4 days.  Consistent daily weight gain by the age of 5 days, without weight loss after the age of 2 weeks. After a feeding, your baby may spit up a small amount of milk. This is normal. Breastfeeding frequency and duration Frequent feeding will help you make more milk and can prevent sore nipples and extremely full breasts (breast  engorgement). Breastfeed when you feel the need to reduce the fullness of your breasts or when your baby shows signs of hunger. This is called "breastfeeding on demand." Signs that your baby is hungry include:  Increased alertness, activity, or restlessness.  Movement of the head from side to side.  Opening of the mouth when the corner of the mouth or cheek is stroked (rooting).  Increased sucking sounds, smacking lips, cooing, sighing, or squeaking.  Hand-to-mouth movements and sucking on fingers or hands.  Fussing or crying.  Avoid introducing a pacifier to your baby in the first 4-6 weeks after your baby is born. After this time, you may choose to use a pacifier. Research has shown that pacifier use during the first year of a baby's life decreases the risk of sudden infant death syndrome (SIDS). Allow your baby to feed on each breast as long as he or she wants. When your baby unlatches or falls asleep while feeding from the first breast, offer the second breast. Because newborns are often sleepy in the first few weeks of life, you may need to awaken your baby to get him or her to feed. Breastfeeding times will vary from baby to baby. However, the following rules can serve as a guide to help you make sure that your baby is properly fed:  Newborns (babies 4 weeks of age or younger) may breastfeed every 1-3 hours.  Newborns should not go without breastfeeding for longer than 3 hours during the day or 5 hours during the night.  You should breastfeed your baby a minimum of 8 times in a 24-hour period.  Breast milk pumping Pumping and storing breast milk allows you to make sure that your baby is exclusively fed your breast milk, even at times   when you are unable to breastfeed. This is especially important if you go back to work while you are still breastfeeding, or if you are not able to be present during feedings. Your lactation consultant can help you find a method of pumping that works best  for you and give you guidelines about how long it is safe to store breast milk. Caring for your breasts while you breastfeed Nipples can become dry, cracked, and sore while breastfeeding. The following recommendations can help keep your breasts moisturized and healthy:  Avoid using soap on your nipples.  Wear a supportive bra designed especially for nursing. Avoid wearing underwire-style bras or extremely tight bras (sports bras).  Air-dry your nipples for 3-4 minutes after each feeding.  Use only cotton bra pads to absorb leaked breast milk. Leaking of breast milk between feedings is normal.  Use lanolin on your nipples after breastfeeding. Lanolin helps to maintain your skin's normal moisture barrier. Pure lanolin is not harmful (not toxic) to your baby. You may also hand express a few drops of breast milk and gently massage that milk into your nipples and allow the milk to air-dry.  In the first few weeks after giving birth, some women experience breast engorgement. Engorgement can make your breasts feel heavy, warm, and tender to the touch. Engorgement peaks within 3-5 days after you give birth. The following recommendations can help to ease engorgement:  Completely empty your breasts while breastfeeding or pumping. You may want to start by applying warm, moist heat (in the shower or with warm, water-soaked hand towels) just before feeding or pumping. This increases circulation and helps the milk flow. If your baby does not completely empty your breasts while breastfeeding, pump any extra milk after he or she is finished.  Apply ice packs to your breasts immediately after breastfeeding or pumping, unless this is too uncomfortable for you. To do this: ? Put ice in a plastic bag. ? Place a towel between your skin and the bag. ? Leave the ice on for 20 minutes, 2-3 times a day.  Make sure that your baby is latched on and positioned properly while breastfeeding.  If engorgement persists  after 48 hours of following these recommendations, contact your health care provider or a lactation consultant. Overall health care recommendations while breastfeeding  Eat 3 healthy meals and 3 snacks every day. Well-nourished mothers who are breastfeeding need an additional 450-500 calories a day. You can meet this requirement by increasing the amount of a balanced diet that you eat.  Drink enough water to keep your urine pale yellow or clear.  Rest often, relax, and continue to take your prenatal vitamins to prevent fatigue, stress, and low vitamin and mineral levels in your body (nutrient deficiencies).  Do not use any products that contain nicotine or tobacco, such as cigarettes and e-cigarettes. Your baby may be harmed by chemicals from cigarettes that pass into breast milk and exposure to secondhand smoke. If you need help quitting, ask your health care provider.  Avoid alcohol.  Do not use illegal drugs or marijuana.  Talk with your health care provider before taking any medicines. These include over-the-counter and prescription medicines as well as vitamins and herbal supplements. Some medicines that may be harmful to your baby can pass through breast milk.  It is possible to become pregnant while breastfeeding. If birth control is desired, ask your health care provider about options that will be safe while breastfeeding your baby. Where to find more information: [employer]: www.llli.org Contact a health care provider if:  You feel like you want to stop breastfeeding or have become frustrated with breastfeeding.  Your nipples are cracked or bleeding.  Your breasts are red, tender, or warm.  You have: ? Painful breasts or nipples. ? A swollen area on either breast. ? A fever or chills. ? Nausea or vomiting. ? Drainage other than breast milk from your nipples.  Your breasts do not become full before feedings by the fifth day after you give birth.  You  feel sad and depressed.  Your baby is: ? Too sleepy to eat well. ? Having trouble sleeping. ? More than 1 week old and wetting fewer than 6 diapers in a 24-hour period. ? Not gaining weight by 5 days of age.  Your baby has fewer than 3 stools in a 24-hour period.  Your baby's skin or the white parts of his or her eyes become yellow. Get help right away if:  Your baby is overly tired (lethargic) and does not want to wake up and feed.  Your baby develops an unexplained fever. Summary  Breastfeeding offers many health benefits for infant and mothers.  Try to breastfeed your infant when he or she shows early signs of hunger.  Gently tickle or stroke your baby's lips with your finger or nipple to allow the baby to open his or her mouth. Bring the baby to your breast. Make sure that much of the areola is in your baby's mouth. Offer one side and burp the baby before you offer the other side.  Talk with your health care provider or lactation consultant if you have questions or you face problems as you breastfeed. This information is not intended to replace advice given to you by your health care provider. Make sure you discuss any questions you have with your health care provider. Document Released: 10/30/2005 Document Revised: 12/01/2016 Document Reviewed: 12/01/2016 [employer] Interactive Patient Education  2018 [employer]  

## 2018-03-06 NOTE — Progress Notes (Signed)
    PRENATAL VISIT NOTE  Subjective:  Dominique Cannon is a 32 y.o. G3P0020 at 27w0dbeing seen today for ongoing prenatal care.  She is currently monitored for the following issues for this low-risk pregnancy and has Family history of breast cancer; Family history of ovarian cancer; Genetic testing; BRCA2 positive; Supervision of other normal pregnancy, antepartum; and History of recurrent miscarriages on their problem list.  Patient reports no complaints.  Contractions: Not present.  .  Movement: Present. Denies leaking of fluid.   The following portions of the patient's history were reviewed and updated as appropriate: allergies, current medications, past family history, past medical history, past social history, past surgical history and problem list. Problem list updated.  Objective:   Vitals:   03/06/18 0938  BP: 103/67  Pulse: 72  Weight: 141 lb (64 kg)    Fetal Status: Fetal Heart Rate (bpm): 137 Fundal Height: 20 cm Movement: Present     General:  Alert, oriented and cooperative. Patient is in no acute distress.  Skin: Skin is warm and dry. No rash noted.   Cardiovascular: Normal heart rate noted  Respiratory: Normal respiratory effort, no problems with respiration noted  Abdomen: Soft, gravid, appropriate for gestational age.  Pain/Pressure: Absent     Pelvic: Cervical exam deferred        Extremities: Normal range of motion.     Mental Status: Normal mood and affect. Normal behavior. Normal judgment and thought content.   Assessment and Plan:  Pregnancy: G3P0020 at 246w0d1. Supervision of other normal pregnancy, antepartum Anatomy usKoreaomorrow, the do not want to know gender Advised of 28 wk labs next visit.  Preterm labor symptoms and general obstetric precautions including but not limited to vaginal bleeding, contractions, leaking of fluid and fetal movement were reviewed in detail with the patient. Please refer to After Visit Summary for other counseling  recommendations.  Return in 8 weeks (on 05/01/2018) for 28 wk labs.  Future Appointments  Date Time Provider DeDalton Gardens4/25/2019  1:45 PM WHLargoSKorea [facility]  05/01/2018  9:00 AM PiAletha HalimMD [facility]  10/30/2018  3:30 PM Magrinat, GuVirgie DadMD [facility] None    TaDonnamae JudeMD

## 2018-03-06 NOTE — Progress Notes (Signed)
Compliant with BRx

## 2018-03-07 ENCOUNTER — Other Ambulatory Visit: Payer: Self-pay | Admitting: Student

## 2018-03-07 ENCOUNTER — Ambulatory Visit (HOSPITAL_COMMUNITY)
Admission: RE | Admit: 2018-03-07 | Discharge: 2018-03-07 | Disposition: A | Discharge status: Home / Self Care | Payer: BC Managed Care – PPO | Source: Ambulatory Visit | Attending: Student | Admitting: Student

## 2018-03-07 DIAGNOSIS — Z369 Encounter for antenatal screening, unspecified: Secondary | ICD-10-CM

## 2018-03-07 DIAGNOSIS — Z3A20 20 weeks gestation of pregnancy: Secondary | ICD-10-CM

## 2018-03-07 DIAGNOSIS — Z3480 Encounter for supervision of other normal pregnancy, unspecified trimester: Secondary | ICD-10-CM

## 2018-03-07 DIAGNOSIS — Z36 Encounter for antenatal screening for chromosomal anomalies: Secondary | ICD-10-CM | POA: Insufficient documentation

## 2018-03-18 ENCOUNTER — Ambulatory Visit (INDEPENDENT_AMBULATORY_CARE_PROVIDER_SITE_OTHER): Payer: BC Managed Care – PPO | Admitting: Obstetrics & Gynecology

## 2018-03-18 VITALS — BP 116/75 | HR 99 | Temp 98.3°F | Wt 141.4 lb

## 2018-03-18 DIAGNOSIS — J029 Acute pharyngitis, unspecified: Secondary | ICD-10-CM | POA: Diagnosis not present

## 2018-03-18 NOTE — Addendum Note (Signed)
Addended by: Cheree Ditto, Adryanna Friedt A on: 03/18/2018 03:53 PM   Modules accepted: Orders

## 2018-03-18 NOTE — Progress Notes (Signed)
History:  32 y.o. [id number] here today for URI sx in pregnancy. 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 She has been exposed to many infections including her spouse who was recently ill.   She has a morning cough and congestion. She denies fever or chills.     The following portions of the patient's history were reviewed and updated as appropriate: allergies, current medications, past family history, past medical history, past social history, past surgical history and problem list.  Review of Systems:  Pertinent items are noted in HPI.    Objective:  Physical Exam Blood pressure 116/75, pulse 99, temperature 98.3 F (36.8 C), weight 141 lb 6.4 oz (64.1 kg), last menstrual period 10/17/2017. CONSTITUTIONAL: Well-developed, well-nourished female in no acute distress.  HENT:  Normocephalic, atraumatic. No erythema or white patches noted  EYES: Conjunctivae and EOM are normal. No scleral icterus.  NECK: Normal range of motion; NO LA Lungs: CTA CV: RRR SKIN: Skin is warm and dry. No rash noted. Not diaphoretic.No pallor. NEUROLGIC: Alert and oriented to person, place, and time. Normal coordination.   Labs and Imaging [place] Mfm Ob Comp + 14 Wk  Result Date: 03/08/2018 ----------------------------------------------------------------------  OBSTETRICS REPORT                      (Signed Final 03/08/2018 08:14 am) ---------------------------------------------------------------------- Patient Info  ID #:       [id number]                          D.O.B.:  06-16-1986 (32 yrs)  Name:       Dominique Cannon                   Visit Date: 03/07/2018 01:42 pm ---------------------------------------------------------------------- Performed By  Performed By:     Tommi Emery         Ref. Address:     [address]                                                             [place]                                                             [place] [address]  Attending:        Particia Nearing MD        Location:         [facility]  Referred By:      Jorje Guild CNM ---------------------------------------------------------------------- Orders   #  Description                                 Code   1  [place] MFM OB COMP + 14 WK  40981.19  ----------------------------------------------------------------------   #  Ordered By               Order #        Accession #    Episode #   1  Luna Kitchens         [id number]      [id number]     [id number]  ---------------------------------------------------------------------- Indications   [redacted] weeks gestation of pregnancy                Z3A.20   Encounter for antenatal screening for          Z36.0   chromosomal anomalies  ---------------------------------------------------------------------- OB History  Gravidity:    3         Term:   0        Prem:   0        SAB:   2  TOP:          0       Ectopic:  0        Living: 0 ---------------------------------------------------------------------- Fetal Evaluation  Num Of Fetuses:     1  Fetal Heart         140  Rate(bpm):  Cardiac Activity:   Observed  Presentation:       Transverse, head to maternal right  Placenta:           Posterior, above cervical os  P. Cord Insertion:  Visualized  Amniotic Fluid  AFI FV:      Subjectively within normal limits                              Largest Pocket(cm)                              5.67 ---------------------------------------------------------------------- Biometry  BPD:        49  mm     G. Age:  20w 6d         77  %    CI:        77.26   %    70 - 86                                                          FL/HC:      17.5   %    16.8 - 19.8  HC:      176.5  mm     G. Age:  20w 1d         42  %    HC/AC:      1.09        1.09 - 1.39  AC:      161.9  mm     G. Age:  21w 2d         79  %    FL/BPD:     62.9   %  FL:       30.8  mm     G. Age:  19w 4d         22  %    FL/AC:      19.0   %    20 - 24  HUM:      32.2  mm     G. Age:  20w 5d          70  %  CER:      21.2  mm     G. Age:  20w 1d         51  %  CM:        7.7  mm  Est. FW:     356  gm    0 lb 13 oz      53  % ---------------------------------------------------------------------- Gestational Age  LMP:           20w 1d        Date:  10/17/17                 EDD:   07/24/18  U/S Today:     20w 3d                                        EDD:   07/22/18  Best:          20w 1d     Det. By:  LMP  (10/17/17)          EDD:   07/24/18 ---------------------------------------------------------------------- Anatomy  Cranium:               Appears normal         Aortic Arch:            Appears normal  Cavum:                 Appears normal         Ductal Arch:            Not well visualized  Ventricles:            Appears normal         Diaphragm:              Not well visualized  Choroid Plexus:        Appears normal         Stomach:                Appears normal, left                                                                        sided  Cerebellum:            Appears normal         Abdomen:                Appears normal  Posterior Fossa:       Appears normal         Abdominal Wall:         Not well visualized  Nuchal Fold:           Not applicable (>20    Cord Vessels:           Appears normal ([redacted]  wks GA)                                        vessel cord)  Face:                  Appears normal         Kidneys:                Appear normal                         (orbits and profile)  Lips:                  Appears normal         Bladder:                Appears normal  Thoracic:              Appears normal         Spine:                  Not well visualized  Heart:                 Not well visualized    Upper Extremities:      Appears normal  RVOT:                  Not well visualized    Lower Extremities:      Appears normal  LVOT:                  Not well visualized  Other:  Parents do not wish to know sex of fetus!!( appears to be a female)          Heels visualized.  Technically difficult due to fetal position. ---------------------------------------------------------------------- Cervix Uterus Adnexa  Cervix  Length:           3.64  cm.  Normal appearance by transabdominal scan.  Uterus  No abnormality visualized.  Left Ovary  Not visualized. No adnexal mass visualized.  Right Ovary  Size(cm)       3.5  x   2.8    x  1.8       Vol(ml): 9.2  Within normal limits.  Cul De Sac:   No free fluid seen.  Adnexa:       No abnormality visualized. ---------------------------------------------------------------------- Impression  SIUP at 20+1 weeks  Normal detailed fetal anatomy; limited views of heart, CI and  spine  Markers of aneuploidy: none  Normal amniotic fluid volume  Measurements consistent with LMP dating ---------------------------------------------------------------------- Recommendations  Follow-up ultrasound in 4-6 weeks to complete anatomy  survey ----------------------------------------------------------------------                 Particia Nearing, MD Electronically Signed Final Report   03/08/2018 08:14 am ----------------------------------------------------------------------   Assessment & Plan:  Viral URI Pregnancy incidental   Sx relief- reviewed meds safe in pregnancy.  Strep cx throat F/u prn  Radiah Lubinski L. Harraway-Smith, M.D., [place]n Core

## 2018-03-18 NOTE — Patient Instructions (Signed)

## 2018-03-18 NOTE — Progress Notes (Signed)
Pt complains of having scratchy throat, headache, sinus drainage, post nasal drip, cough with green mucus. Sx started about a week ago.

## 2018-03-22 ENCOUNTER — Telehealth: Payer: Self-pay

## 2018-03-22 LAB — RAPID STREP SCREEN (MED CTR MEBANE ONLY): Strep Gp A Ag, IA W/Reflex: NEGATIVE

## 2018-03-22 LAB — CULTURE, GROUP A STREP: Strep A Culture: POSITIVE — AB

## 2018-03-22 NOTE — Telephone Encounter (Signed)
Patient will need a follow up in four weeks per MFM to complete anatomy. Orders has been placed. Patient will notified of appointment time and date.

## 2018-03-25 ENCOUNTER — Telehealth: Payer: Self-pay

## 2018-03-25 DIAGNOSIS — Z3402 Encounter for supervision of normal first pregnancy, second trimester: Secondary | ICD-10-CM

## 2018-03-27 NOTE — Telephone Encounter (Signed)
Entered in error

## 2018-04-01 ENCOUNTER — Ambulatory Visit (HOSPITAL_COMMUNITY)
Admission: RE | Admit: 2018-04-01 | Discharge: 2018-04-01 | Disposition: A | Discharge status: Home / Self Care | Payer: BC Managed Care – PPO | Source: Ambulatory Visit | Attending: Student | Admitting: Student

## 2018-04-01 DIAGNOSIS — Z3402 Encounter for supervision of normal first pregnancy, second trimester: Secondary | ICD-10-CM

## 2018-04-01 DIAGNOSIS — Z0489 Encounter for examination and observation for other specified reasons: Secondary | ICD-10-CM | POA: Insufficient documentation

## 2018-04-01 DIAGNOSIS — Z3A23 23 weeks gestation of pregnancy: Secondary | ICD-10-CM | POA: Insufficient documentation

## 2018-04-01 DIAGNOSIS — IMO0002 Reserved for concepts with insufficient information to code with codable children: Secondary | ICD-10-CM | POA: Insufficient documentation

## 2018-05-01 ENCOUNTER — Ambulatory Visit (INDEPENDENT_AMBULATORY_CARE_PROVIDER_SITE_OTHER): Payer: BC Managed Care – PPO | Admitting: Obstetrics and Gynecology

## 2018-05-01 VITALS — BP 97/63 | HR 72 | Wt 151.2 lb

## 2018-05-01 DIAGNOSIS — Z23 Encounter for immunization: Secondary | ICD-10-CM

## 2018-05-01 DIAGNOSIS — Z3480 Encounter for supervision of other normal pregnancy, unspecified trimester: Secondary | ICD-10-CM

## 2018-05-01 DIAGNOSIS — Z3483 Encounter for supervision of other normal pregnancy, third trimester: Secondary | ICD-10-CM

## 2018-05-01 NOTE — Progress Notes (Signed)
Prenatal Visit Note Date: 05/01/2018 Clinic: [facility]  Subjective:  Kalisi Bevill is a 32 y.o. G3P0020 at 19w0dbeing seen today for ongoing prenatal care.  She is currently monitored for the following issues for this low-risk pregnancy and has Family history of breast cancer; Family history of ovarian cancer; Genetic testing; BRCA2 positive; Supervision of other normal pregnancy, antepartum; History of recurrent miscarriages; and Evaluate anatomy not seen on prior sonogram on their problem list.  Patient reports no complaints.   Contractions: Not present.  .  Movement: Present. Denies leaking of fluid.   The following portions of the patient's history were reviewed and updated as appropriate: allergies, current medications, past family history, past medical history, past social history, past surgical history and problem list. Problem list updated.  Objective:   Vitals:   05/01/18 0821  BP: 97/63  Pulse: 72  Weight: 151 lb 3.2 oz (68.6 kg)    Fetal Status: Fetal Heart Rate (bpm): 130 Fundal Height: 27 cm Movement: Present     General:  Alert, oriented and cooperative. Patient is in no acute distress.  Skin: Skin is warm and dry. No rash noted.   Cardiovascular: Normal heart rate noted  Respiratory: Normal respiratory effort, no problems with respiration noted  Abdomen: Soft, gravid, appropriate for gestational age. Pain/Pressure: Absent     Pelvic:  Cervical exam deferred        Extremities: Normal range of motion.  Edema: None  Mental Status: Normal mood and affect. Normal behavior. Normal judgment and thought content.   Urinalysis:      Assessment and Plan:  Pregnancy: G3P0020 at 243w0d1. Supervision of other normal pregnancy, antepartum Routine care. Pt would like to hold off on completion anatomy u/s since it would be her 3rd one. Pt told if she changes her mind to let usKoreanow - RPR - CBC - Glucose Tolerance, 2 Hours w/1 Hour - HIV antibody -  Tdap vaccine greater than or equal to 7yo IM  Preterm labor symptoms and general obstetric precautions including but not limited to vaginal bleeding, contractions, leaking of fluid and fetal movement were reviewed in detail with the patient. Please refer to After Visit Summary for other counseling recommendations.  Return in about 3 weeks (around 05/22/2018) for per baby scripts.   PiAletha HalimMD

## 2018-05-02 LAB — CBC
HEMATOCRIT: 31.5 % — AB (ref 34.0–46.6)
HEMOGLOBIN: 10.7 g/dL — AB (ref 11.1–15.9)
MCH: 29.9 pg (ref 26.6–33.0)
MCHC: 34 g/dL (ref 31.5–35.7)
MCV: 88 fL (ref 79–97)
Platelets: 279 10*3/uL (ref 150–450)
RBC: 3.58 x10E6/uL — AB (ref 3.77–5.28)
RDW: 12.9 % (ref 12.3–15.4)
WBC: 10 10*3/uL (ref 3.4–10.8)

## 2018-05-02 LAB — GLUCOSE TOLERANCE, 2 HOURS W/ 1HR
GLUCOSE, 1 HOUR: 111 mg/dL (ref 65–179)
GLUCOSE, 2 HOUR: 115 mg/dL (ref 65–152)
GLUCOSE, FASTING: 73 mg/dL (ref 65–91)

## 2018-05-02 LAB — RPR: RPR: NONREACTIVE

## 2018-05-02 LAB — HIV ANTIBODY (ROUTINE TESTING W REFLEX): HIV SCREEN 4TH GENERATION: NONREACTIVE

## 2018-05-22 ENCOUNTER — Ambulatory Visit (INDEPENDENT_AMBULATORY_CARE_PROVIDER_SITE_OTHER): Payer: BC Managed Care – PPO | Admitting: Obstetrics and Gynecology

## 2018-05-22 VITALS — BP 107/70 | HR 91 | Wt 154.4 lb

## 2018-05-22 DIAGNOSIS — Z3483 Encounter for supervision of other normal pregnancy, third trimester: Secondary | ICD-10-CM

## 2018-05-22 DIAGNOSIS — Z029 Encounter for administrative examinations, unspecified: Secondary | ICD-10-CM

## 2018-05-22 DIAGNOSIS — Z3480 Encounter for supervision of other normal pregnancy, unspecified trimester: Secondary | ICD-10-CM

## 2018-05-22 NOTE — Progress Notes (Signed)
Prenatal Visit Note Date: 05/22/2018 Clinic: [facility]  Subjective:  Leeandra Ellerson is a 32 y.o. G3P0020 at 37w0dbeing seen today for ongoing prenatal care.  She is currently monitored for the following issues for this low-risk pregnancy and has Family history of breast cancer; Family history of ovarian cancer; Genetic testing; BRCA2 positive; Supervision of other normal pregnancy, antepartum; and History of recurrent miscarriages on their problem list.  Patient reports no complaints.   Contractions: Not present. Vag. Bleeding: None.  Movement: Present. Denies leaking of fluid.   The following portions of the patient's history were reviewed and updated as appropriate: allergies, current medications, past family history, past medical history, past social history, past surgical history and problem list. Problem list updated.  Objective:   Vitals:   05/22/18 0828  BP: 107/70  Pulse: 91  Weight: 154 lb 6.4 oz (70 kg)    Fetal Status: Fetal Heart Rate (bpm): 138 Fundal Height: 32 cm Movement: Present     General:  Alert, oriented and cooperative. Patient is in no acute distress.  Skin: Skin is warm and dry. No rash noted.   Cardiovascular: Normal heart rate noted  Respiratory: Normal respiratory effort, no problems with respiration noted  Abdomen: Soft, gravid, appropriate for gestational age. Pain/Pressure: Absent     Pelvic:  Cervical exam deferred        Extremities: Normal range of motion.  Edema: None  Mental Status: Normal mood and affect. Normal behavior. Normal judgment and thought content.   Urinalysis:      Assessment and Plan:  Pregnancy: G3P0020 at 372w0d1. Supervision of other normal pregnancy, antepartum Routine care. D/w pt re: birth control next visit. GBS nv.   Preterm labor symptoms and general obstetric precautions including but not limited to vaginal bleeding, contractions, leaking of fluid and fetal movement were reviewed in detail with  the patient. Please refer to After Visit Summary for other counseling recommendations.  RTC: per baby scripts   PiAletha HalimMD

## 2018-06-10 ENCOUNTER — Telehealth: Payer: Self-pay | Admitting: Obstetrics & Gynecology

## 2018-06-10 NOTE — Telephone Encounter (Signed)
Encounter opened in error

## 2018-06-19 ENCOUNTER — Ambulatory Visit (INDEPENDENT_AMBULATORY_CARE_PROVIDER_SITE_OTHER): Payer: BC Managed Care – PPO | Admitting: Obstetrics and Gynecology

## 2018-06-19 VITALS — BP 111/71 | HR 91 | Wt 159.2 lb

## 2018-06-19 DIAGNOSIS — Z3480 Encounter for supervision of other normal pregnancy, unspecified trimester: Secondary | ICD-10-CM

## 2018-06-19 NOTE — Progress Notes (Signed)
Prenatal Visit Note Date: 06/19/2018 Clinic: [facility]  Subjective:  Dominique Cannon is a 32 y.o. G3P0020 at [redacted]w[redacted]d being seen today for ongoing prenatal care.  She is currently monitored for the following issues for this low-risk pregnancy and has Family history of breast cancer; Family history of ovarian cancer; Genetic testing; BRCA2 positive; Supervision of other normal pregnancy, antepartum; and History of recurrent miscarriages on their problem list.  Patient reports no complaints.   Contractions: Not present.  .  Movement: Present. Denies leaking of fluid.   The following portions of the patient's history were reviewed and updated as appropriate: allergies, current medications, past family history, past medical history, past social history, past surgical history and problem list. Problem list updated.  Objective:   Vitals:   06/19/18 0825  BP: 111/71  Pulse: 91  Weight: 159 lb 3.2 oz (72.2 kg)    Fetal Status: Fetal Heart Rate (bpm): 137 Fundal Height: 35 cm Movement: Present  Presentation: Vertex  General:  Alert, oriented and cooperative. Patient is in no acute distress.  Skin: Skin is warm and dry. No rash noted.   Cardiovascular: Normal heart rate noted  Respiratory: Normal respiratory effort, no problems with respiration noted  Abdomen: Soft, gravid, appropriate for gestational age. Pain/Pressure: Absent     Pelvic:  Cervical exam deferred        Extremities: Normal range of motion.  Edema: None  Mental Status: Normal mood and affect. Normal behavior. Normal judgment and thought content.   Urinalysis:      Assessment and Plan:  Pregnancy: G3P0020 at [redacted]w[redacted]d  1. Supervision of other normal pregnancy, antepartum D/w pt re: BC more nv. GBS nv.   Preterm labor symptoms and general obstetric precautions including but not limited to vaginal bleeding, contractions, leaking of fluid and fetal movement were reviewed in detail with the patient. Please refer  to After Visit Summary for other counseling recommendations.  Return in about 10 days (around 06/29/2018) for 7-10d rob.   Pickens, Charlie, MD   

## 2018-07-01 ENCOUNTER — Ambulatory Visit (INDEPENDENT_AMBULATORY_CARE_PROVIDER_SITE_OTHER): Payer: BC Managed Care – PPO | Admitting: Family Medicine

## 2018-07-01 VITALS — BP 107/77 | HR 91 | Wt 164.0 lb

## 2018-07-01 DIAGNOSIS — Z23 Encounter for immunization: Secondary | ICD-10-CM | POA: Diagnosis not present

## 2018-07-01 DIAGNOSIS — Z3480 Encounter for supervision of other normal pregnancy, unspecified trimester: Secondary | ICD-10-CM

## 2018-07-01 DIAGNOSIS — Z3483 Encounter for supervision of other normal pregnancy, third trimester: Secondary | ICD-10-CM

## 2018-07-01 DIAGNOSIS — Z113 Encounter for screening for infections with a predominantly sexual mode of transmission: Secondary | ICD-10-CM

## 2018-07-01 MED ORDER — BREAST PUMP MISC: 1.0000 [IU] | 0 refills | Status: DC | PRN

## 2018-07-01 NOTE — Progress Notes (Signed)
   PRENATAL VISIT NOTE  Subjective:  Dominique Cannon is a 32 y.o. G3P0020 at 36w5dbeing seen today for ongoing prenatal care.  She is currently monitored for the following issues for this low-risk pregnancy and has Family history of breast cancer; Family history of ovarian cancer; Genetic testing; BRCA2 positive; Supervision of other normal pregnancy, antepartum; and History of recurrent miscarriages on their problem list.  Patient reports no complaints.  Contractions: Not present. Vag. Bleeding: None.  Movement: Present. Denies leaking of fluid.   The following portions of the patient's history were reviewed and updated as appropriate: allergies, current medications, past family history, past medical history, past social history, past surgical history and problem list. Problem list updated.  Objective:   Vitals:   07/01/18 1611 07/01/18 1652  BP: (!) 132/92 107/77  Pulse: (!) 101 91  Weight: 164 lb (74.4 kg)     Fetal Status: Fetal Heart Rate (bpm): 136 Fundal Height: 33 cm Movement: Present  Presentation: Vertex  General:  Alert, oriented and cooperative. Patient is in no acute distress.  Skin: Skin is warm and dry. No rash noted.   Cardiovascular: Normal heart rate noted  Respiratory: Normal respiratory effort, no problems with respiration noted  Abdomen: Soft, gravid, appropriate for gestational age.  Pain/Pressure: Present     Pelvic: Cervical exam performed Dilation: 1 Effacement (%): 70 Station: -2  Extremities: Normal range of motion.  Edema: None  Mental Status: Normal mood and affect. Normal behavior. Normal judgment and thought content.   Assessment and Plan:  Pregnancy: [id number] 383w5d1. Supervision of other normal pregnancy, antepartum Cultures today - GC/Chlamydia [facility] amp [facility], beta strep (group b only) - Misc. Devices (BREAST PUMP) MISC; 1 Units by Does not apply route as needed.  Dispense: 1 each; Refill: 0  2. Need for  immunization against influenza  - Flu Vaccine QUAD 36+ mos IM  Term labor symptoms and general obstetric precautions including but not limited to vaginal bleeding, contractions, leaking of fluid and fetal movement were reviewed in detail with the patient. Please refer to After Visit Summary for other counseling recommendations.  Return in 2 weeks (on 07/15/2018).  Future Appointments  Date Time Provider DeEdmund8/26/2019  3:15 PM PiAletha HalimMD [facility]  07/16/2018  3:00 PM Anyanwu, UgSallyanne HaversMD [facility]  10/30/2018  3:30 PM Magrinat, GuVirgie DadMD [facility] None    TaDonnamae JudeMD

## 2018-07-01 NOTE — Patient Instructions (Addendum)
Influenza (Flu) Vaccine (Inactivated or Recombinant): What You Need to Know 1. Why get vaccinated? Influenza ("flu") is a contagious disease that spreads around the [place] every year, usually between October and May. Flu is caused by influenza viruses, and is spread mainly by coughing, sneezing, and close contact. Anyone can get flu. Flu strikes suddenly and can last several days. Symptoms vary by age, but can include:  fever/chills  sore throat  muscle aches  fatigue  cough  headache  runny or stuffy nose  Flu can also lead to pneumonia and blood infections, and cause diarrhea and seizures in children. If you have a medical condition, such as heart or lung disease, flu can make it worse. Flu is more dangerous for some people. Infants and young children, people 65 years of age and older, pregnant women, and people with certain health conditions or a weakened immune system are at greatest risk. Each year thousands of people in the [place] die from flu, and many more are hospitalized. Flu vaccine can:  keep you from getting flu,  make flu less severe if you do get it, and  keep you from spreading flu to your family and other people. 2. Inactivated and recombinant flu vaccines A dose of flu vaccine is recommended every flu season. Children 6 months through 8 years of age may need two doses during the same flu season. Everyone else needs only one dose each flu season. Some inactivated flu vaccines contain a very small amount of a mercury-based preservative called thimerosal. Studies have not shown thimerosal in vaccines to be harmful, but flu vaccines that do not contain thimerosal are available. There is no live flu virus in flu shots. They cannot cause the flu. There are many flu viruses, and they are always changing. Each year a new flu vaccine is made to protect against three or four viruses that are likely to cause disease in the upcoming flu season. But even when the  vaccine doesn't exactly match these viruses, it may still provide some protection. Flu vaccine cannot prevent:  flu that is caused by a virus not covered by the vaccine, or  illnesses that look like flu but are not.  It takes about 2 weeks for protection to develop after vaccination, and protection lasts through the flu season. 3. Some people should not get this vaccine Tell the person who is giving you the vaccine:  If you have any severe, life-threatening allergies. If you ever had a life-threatening allergic reaction after a dose of flu vaccine, or have a severe allergy to any part of this vaccine, you may be advised not to get vaccinated. Most, but not all, types of flu vaccine contain a small amount of egg protein.  If you ever had Guillain-Barr Syndrome (also called GBS). Some people with a history of GBS should not get this vaccine. This should be discussed with your doctor.  If you are not feeling well. It is usually okay to get flu vaccine when you have a mild illness, but you might be asked to come back when you feel better.  4. Risks of a vaccine reaction With any medicine, including vaccines, there is a chance of reactions. These are usually mild and go away on their own, but serious reactions are also possible. Most people who get a flu shot do not have any problems with it. Minor problems following a flu shot include:  soreness, redness, or swelling where the shot was given  hoarseness  sore,   red or itchy eyes  cough  fever  aches  headache  itching  fatigue  If these problems occur, they usually begin soon after the shot and last 1 or 2 days. More serious problems following a flu shot can include the following:  There may be a small increased risk of Guillain-Barre Syndrome (GBS) after inactivated flu vaccine. This risk has been estimated at 1 or 2 additional cases per million people vaccinated. This is much lower than the risk of severe complications from  flu, which can be prevented by flu vaccine.  Young children who get the flu shot along with pneumococcal vaccine (PCV13) and/or DTaP vaccine at the same time might be slightly more likely to have a seizure caused by fever. Ask your doctor for more information. Tell your doctor if a child who is getting flu vaccine has ever had a seizure.  Problems that could happen after any injected vaccine:  People sometimes faint after a medical procedure, including vaccination. Sitting or lying down for about 15 minutes can help prevent fainting, and injuries caused by a fall. Tell your doctor if you feel dizzy, or have vision changes or ringing in the ears.  Some people get severe pain in the shoulder and have difficulty moving the arm where a shot was given. This happens very rarely.  Any medication can cause a severe allergic reaction. Such reactions from a vaccine are very rare, estimated at about 1 in a million doses, and would happen within a few minutes to a few hours after the vaccination. As with any medicine, there is a very remote chance of a vaccine causing a serious injury or death. The safety of vaccines is always being monitored. For more information, visit: www.cdc.gov/vaccinesafety/ 5. What if there is a serious reaction? What should I look for? Look for anything that concerns you, such as signs of a severe allergic reaction, very high fever, or unusual behavior. Signs of a severe allergic reaction can include hives, swelling of the face and throat, difficulty breathing, a fast heartbeat, dizziness, and weakness. These would start a few minutes to a few hours after the vaccination. What should I do?  If you think it is a severe allergic reaction or other emergency that can't wait, call 9-1-1 and get the person to the nearest hospital. Otherwise, call your doctor.  Reactions should be reported to the Vaccine Adverse Event Reporting System (VAERS). Your doctor should file this report, or you  can do it yourself through the VAERS web site at www.vaers.hhs.gov, or by calling 1-800-822-7967. ? VAERS does not give medical advice. 6. The National Vaccine Injury Compensation Program The National Vaccine Injury Compensation Program (VICP) is a federal program that was created to compensate people who may have been injured by certain vaccines. Persons who believe they may have been injured by a vaccine can learn about the program and about filing a claim by calling 1-800-338-2382 or visiting the VICP website at www.hrsa.gov/vaccinecompensation. There is a time limit to file a claim for compensation. 7. How can I learn more?  Ask your healthcare provider. He or she can give you the vaccine package insert or suggest other sources of information.  Call your local or state health department.  Contact the Centers for Disease Control and Prevention (CDC): ? Call 1-800-232-4636 (1-800-CDC-INFO) or ? Visit CDC's website at www.cdc.gov/flu Vaccine Information Statement, Inactivated Influenza Vaccine (06/19/2014) This information is not intended to replace advice given to you by your health care provider. Make sure   sure you discuss any questions you have with your health care provider. Document Released: 08/24/2006 Document Revised: 07/20/2016 Document Reviewed: 07/20/2016 Elsevier Interactive Patient Education  2017 [employer]   Breastfeeding Choosing to breastfeed is one of the best decisions you can make for yourself and your baby. A change in hormones during pregnancy causes your breasts to make breast milk in your milk-producing glands. Hormones prevent breast milk from being released before your baby is born. They also prompt milk flow after birth. Once breastfeeding has begun, thoughts of your baby, as well as his or her sucking or crying, can stimulate the release of milk from your milk-producing glands. Benefits of breastfeeding Research shows that breastfeeding offers many health  benefits for infants and mothers. It also offers a cost-free and convenient way to feed your baby. For your baby  Your first milk (colostrum) helps your baby's digestive system to function better.  Special cells in your milk (antibodies) help your baby to fight off infections.  Breastfed babies are less likely to develop asthma, allergies, obesity, or type 2 diabetes. They are also at lower risk for sudden infant death syndrome (SIDS).  Nutrients in breast milk are better able to meet your baby's needs compared to infant formula.  Breast milk improves your baby's brain development. For you  Breastfeeding helps to create a very special bond between you and your baby.  Breastfeeding is convenient. Breast milk costs nothing and is always available at the correct temperature.  Breastfeeding helps to burn calories. It helps you to lose the weight that you gained during pregnancy.  Breastfeeding makes your uterus return faster to its size before pregnancy. It also slows bleeding (lochia) after you give birth.  Breastfeeding helps to lower your risk of developing type 2 diabetes, osteoporosis, rheumatoid arthritis, cardiovascular disease, and breast, ovarian, uterine, and endometrial cancer later in life. Breastfeeding basics Starting breastfeeding  Find a comfortable place to sit or lie down, with your neck and back well-supported.  Place a pillow or a rolled-up blanket under your baby to bring him or her to the level of your breast (if you are seated). Nursing pillows are specially designed to help support your arms and your baby while you breastfeed.  Make sure that your baby's tummy (abdomen) is facing your abdomen.  Gently massage your breast. With your fingertips, massage from the outer edges of your breast inward toward the nipple. This encourages milk flow. If your milk flows slowly, you may need to continue this action during the feeding.  Support your breast with 4 fingers  underneath and your thumb above your nipple (make the letter "C" with your hand). Make sure your fingers are well away from your nipple and your baby's mouth.  Stroke your baby's lips gently with your finger or nipple.  When your baby's mouth is open wide enough, quickly bring your baby to your breast, placing your entire nipple and as much of the areola as possible into your baby's mouth. The areola is the colored area around your nipple. ? More areola should be visible above your baby's upper lip than below the lower lip. ? Your baby's lips should be opened and extended outward (flanged) to ensure an adequate, comfortable latch. ? Your baby's tongue should be between his or her lower gum and your breast.  Make sure that your baby's mouth is correctly positioned around your nipple (latched). Your baby's lips should create a seal on your breast and be turned out (everted).  It  is common for your baby to suck about 2-3 minutes in order to start the flow of breast milk. Latching Teaching your baby how to latch onto your breast properly is very important. An improper latch can cause nipple pain, decreased milk supply, and poor weight gain in your baby. Also, if your baby is not latched onto your nipple properly, he or she may swallow some air during feeding. This can make your baby fussy. Burping your baby when you switch breasts during the feeding can help to get rid of the air. However, teaching your baby to latch on properly is still the best way to prevent fussiness from swallowing air while breastfeeding. Signs that your baby has successfully latched onto your nipple  Silent tugging or silent sucking, without causing you pain. Infant's lips should be extended outward (flanged).  Swallowing heard between every 3-4 sucks once your milk has started to flow (after your let-down milk reflex occurs).  Muscle movement above and in front of his or her ears while sucking.  Signs that your baby has not  successfully latched onto your nipple  Sucking sounds or smacking sounds from your baby while breastfeeding.  Nipple pain.  If you think your baby has not latched on correctly, slip your finger into the corner of your baby's mouth to break the suction and place it between your baby's gums. Attempt to start breastfeeding again. Signs of successful breastfeeding Signs from your baby  Your baby will gradually decrease the number of sucks or will completely stop sucking.  Your baby will fall asleep.  Your baby's body will relax.  Your baby will retain a small amount of milk in his or her mouth.  Your baby will let go of your breast by himself or herself.  Signs from you  Breasts that have increased in firmness, weight, and size 1-3 hours after feeding.  Breasts that are softer immediately after breastfeeding.  Increased milk volume, as well as a change in milk consistency and color by the fifth day of breastfeeding.  Nipples that are not sore, cracked, or bleeding.  Signs that your baby is getting enough milk  Wetting at least 1-2 diapers during the first 24 hours after birth.  Wetting at least 5-6 diapers every 24 hours for the first week after birth. The urine should be clear or pale yellow by the age of 5 days.  Wetting 6-8 diapers every 24 hours as your baby continues to grow and develop.  At least 3 stools in a 24-hour period by the age of 5 days. The stool should be soft and yellow.  At least 3 stools in a 24-hour period by the age of 7 days. The stool should be seedy and yellow.  No loss of weight greater than 10% of birth weight during the first 3 days of life.  Average weight gain of 4-7 oz (113-198 g) per week after the age of 4 days.  Consistent daily weight gain by the age of 5 days, without weight loss after the age of 2 weeks. After a feeding, your baby may spit up a small amount of milk. This is normal. Breastfeeding frequency and duration Frequent feeding  will help you make more milk and can prevent sore nipples and extremely full breasts (breast engorgement). Breastfeed when you feel the need to reduce the fullness of your breasts or when your baby shows signs of hunger. This is called "breastfeeding on demand." Signs that your baby is hungry include:  Increased alertness,  activity, or restlessness.  Movement of the head from side to side.  Opening of the mouth when the corner of the mouth or cheek is stroked (rooting).  Increased sucking sounds, smacking lips, cooing, sighing, or squeaking.  Hand-to-mouth movements and sucking on fingers or hands.  Fussing or crying.  Avoid introducing a pacifier to your baby in the first 4-6 weeks after your baby is born. After this time, you may choose to use a pacifier. Research has shown that pacifier use during the first year of a baby's life decreases the risk of sudden infant death syndrome (SIDS). Allow your baby to feed on each breast as long as he or she wants. When your baby unlatches or falls asleep while feeding from the first breast, offer the second breast. Because newborns are often sleepy in the first few weeks of life, you may need to awaken your baby to get him or her to feed. Breastfeeding times will vary from baby to baby. However, the following rules can serve as a guide to help you make sure that your baby is properly fed:  Newborns (babies 47 weeks of age or younger) may breastfeed every 1-3 hours.  Newborns should not go without breastfeeding for longer than 3 hours during the day or 5 hours during the night.  You should breastfeed your baby a minimum of 8 times in a 24-hour period.  Breast milk pumping Pumping and storing breast milk allows you to make sure that your baby is exclusively fed your breast milk, even at times when you are unable to breastfeed. This is especially important if you go back to work while you are still breastfeeding, or if you are not able to be present  during feedings. Your lactation consultant can help you find a method of pumping that works best for you and give you guidelines about how long it is safe to store breast milk. Caring for your breasts while you breastfeed Nipples can become dry, cracked, and sore while breastfeeding. The following recommendations can help keep your breasts moisturized and healthy:  Avoid using soap on your nipples.  Wear a supportive bra designed especially for nursing. Avoid wearing underwire-style bras or extremely tight bras (sports bras).  Air-dry your nipples for 3-4 minutes after each feeding.  Use only cotton bra pads to absorb leaked breast milk. Leaking of breast milk between feedings is normal.  Use lanolin on your nipples after breastfeeding. Lanolin helps to maintain your skin's normal moisture barrier. Pure lanolin is not harmful (not toxic) to your baby. You may also hand express a few drops of breast milk and gently massage that milk into your nipples and allow the milk to air-dry.  In the first few weeks after giving birth, some women experience breast engorgement. Engorgement can make your breasts feel heavy, warm, and tender to the touch. Engorgement peaks within 3-5 days after you give birth. The following recommendations can help to ease engorgement:  Completely empty your breasts while breastfeeding or pumping. You may want to start by applying warm, moist heat (in the shower or with warm, water-soaked hand towels) just before feeding or pumping. This increases circulation and helps the milk flow. If your baby does not completely empty your breasts while breastfeeding, pump any extra milk after he or she is finished.  Apply ice packs to your breasts immediately after breastfeeding or pumping, unless this is too uncomfortable for you. To do this: ? Put ice in a plastic bag. ? Place a towel  between your skin and the bag. ? Leave the ice on for 20 minutes, 2-3 times a day.  Make sure that  your baby is latched on and positioned properly while breastfeeding.  If engorgement persists after 48 hours of following these recommendations, contact your health care provider or a Advertising copywriterlactation consultant. Overall health care recommendations while breastfeeding  Eat 3 healthy meals and 3 snacks every day. Well-nourished mothers who are breastfeeding need an additional 450-500 calories a day. You can meet this requirement by increasing the amount of a balanced diet that you eat.  Drink enough water to keep your urine pale yellow or clear.  Rest often, relax, and continue to take your prenatal vitamins to prevent fatigue, stress, and low vitamin and mineral levels in your body (nutrient deficiencies).  Do not use any products that contain nicotine or tobacco, such as cigarettes and e-cigarettes. Your baby may be harmed by chemicals from cigarettes that pass into breast milk and exposure to secondhand smoke. If you need help quitting, ask your health care provider.  Avoid alcohol.  Do not use illegal drugs or marijuana.  Talk with your health care provider before taking any medicines. These include over-the-counter and prescription medicines as well as vitamins and herbal supplements. Some medicines that may be harmful to your baby can pass through breast milk.  It is possible to become pregnant while breastfeeding. If birth control is desired, ask your health care provider about options that will be safe while breastfeeding your baby. Where to find more information: [employer]: www.llli.org Contact a health care provider if:  You feel like you want to stop breastfeeding or have become frustrated with breastfeeding.  Your nipples are cracked or bleeding.  Your breasts are red, tender, or warm.  You have: ? Painful breasts or nipples. ? A swollen area on either breast. ? A fever or chills. ? Nausea or vomiting. ? Drainage other than breast milk from your  nipples.  Your breasts do not become full before feedings by the fifth day after you give birth.  You feel sad and depressed.  Your baby is: ? Too sleepy to eat well. ? Having trouble sleeping. ? More than 151 week old and wetting fewer than 6 diapers in a 24-hour period. ? Not gaining weight by 705 days of age.  Your baby has fewer than 3 stools in a 24-hour period.  Your baby's skin or the white parts of his or her eyes become yellow. Get help right away if:  Your baby is overly tired (lethargic) and does not want to wake up and feed.  Your baby develops an unexplained fever. Summary  Breastfeeding offers many health benefits for infant and mothers.  Try to breastfeed your infant when he or she shows early signs of hunger.  Gently tickle or stroke your baby's lips with your finger or nipple to allow the baby to open his or her mouth. Bring the baby to your breast. Make sure that much of the areola is in your baby's mouth. Offer one side and burp the baby before you offer the other side.  Talk with your health care provider or lactation consultant if you have questions or you face problems as you breastfeed. This information is not intended to replace advice given to you by your health care provider. Make sure you discuss any questions you have with your health care provider. Document Released: 10/30/2005 Document Revised: 12/01/2016 Document Reviewed: 12/01/2016 Elsevier Interactive Patient Education  2018  [employer].

## 2018-07-03 LAB — GC/CHLAMYDIA PROBE AMP (~~LOC~~) NOT AT ARMC
Chlamydia: NEGATIVE
Neisseria Gonorrhea: NEGATIVE

## 2018-07-05 LAB — CULTURE, BETA STREP (GROUP B ONLY): Strep Gp B Culture: NEGATIVE

## 2018-07-08 ENCOUNTER — Ambulatory Visit (INDEPENDENT_AMBULATORY_CARE_PROVIDER_SITE_OTHER): Payer: BC Managed Care – PPO | Admitting: Obstetrics and Gynecology

## 2018-07-08 ENCOUNTER — Encounter: Payer: Self-pay | Admitting: Obstetrics and Gynecology

## 2018-07-08 VITALS — BP 118/76 | HR 92 | Wt 166.8 lb

## 2018-07-08 DIAGNOSIS — Z3480 Encounter for supervision of other normal pregnancy, unspecified trimester: Secondary | ICD-10-CM

## 2018-07-09 NOTE — Progress Notes (Signed)
Prenatal Visit Note Date: 07/08/2018 Clinic: [facility]  Subjective:  Dominique Cannon is a 32 y.o. G3P0020 at [redacted]w[redacted]d being seen today for ongoing prenatal care.  She is currently monitored for the following issues for this low-risk pregnancy and has Family history of breast cancer; Family history of ovarian cancer; Genetic testing; BRCA2 positive; Supervision of other normal pregnancy, antepartum; and History of recurrent miscarriages on their problem list.  Patient reports no complaints.   Contractions: Not present.  .  Movement: Present. Denies leaking of fluid.   The following portions of the patient's history were reviewed and updated as appropriate: allergies, current medications, past family history, past medical history, past social history, past surgical history and problem list. Problem list updated.  Objective:   Vitals:   07/08/18 1524  BP: 118/76  Pulse: 92  Weight: 166 lb 12.8 oz (75.7 kg)    Fetal Status: Fetal Heart Rate (bpm): 135 Fundal Height: 38 cm Movement: Present  Presentation: Vertex  General:  Alert, oriented and cooperative. Patient is in no acute distress.  Skin: Skin is warm and dry. No rash noted.   Cardiovascular: Normal heart rate noted  Respiratory: Normal respiratory effort, no problems with respiration noted  Abdomen: Soft, gravid, appropriate for gestational age. Pain/Pressure: Present     Pelvic:  Cervical exam deferred        Extremities: Normal range of motion.  Edema: None  Mental Status: Normal mood and affect. Normal behavior. Normal judgment and thought content.   Urinalysis:      Assessment and Plan:  Pregnancy: G3P0020 at [redacted]w[redacted]d  Routine care.   Term labor symptoms and general obstetric precautions including but not limited to vaginal bleeding, contractions, leaking of fluid and fetal movement were reviewed in detail with the patient. Please refer to After Visit Summary for other counseling recommendations.  Return  in about 1 week (around 07/15/2018) for rob.   Pickens, Charlie, MD   

## 2018-07-16 ENCOUNTER — Ambulatory Visit (INDEPENDENT_AMBULATORY_CARE_PROVIDER_SITE_OTHER): Payer: BC Managed Care – PPO | Admitting: Obstetrics & Gynecology

## 2018-07-16 VITALS — BP 116/86 | HR 72 | Wt 165.9 lb

## 2018-07-16 DIAGNOSIS — Z3480 Encounter for supervision of other normal pregnancy, unspecified trimester: Secondary | ICD-10-CM

## 2018-07-16 DIAGNOSIS — Z3483 Encounter for supervision of other normal pregnancy, third trimester: Secondary | ICD-10-CM

## 2018-07-16 NOTE — Progress Notes (Signed)
   PRENATAL VISIT NOTE  Subjective:  Dominique Cannon is a 32 y.o. G3P0020 at 56w6dbeing seen today for ongoing prenatal care.  She is currently monitored for the following issues for this low-risk pregnancy and has Family history of breast cancer; Family history of ovarian cancer; Genetic testing; BRCA2 positive; Supervision of other normal pregnancy, antepartum; and History of recurrent miscarriages on their problem list.  Patient reports no complaints.  Contractions: Not present.  .  Movement: Present. Denies leaking of fluid.   The following portions of the patient's history were reviewed and updated as appropriate: allergies, current medications, past family history, past medical history, past social history, past surgical history and problem list. Problem list updated.  Objective:   Vitals:   07/16/18 1517  BP: 116/86  Pulse: 72  Weight: 165 lb 14.4 oz (75.3 kg)    Fetal Status: Fetal Heart Rate (bpm): 137   Movement: Present     General:  Alert, oriented and cooperative. Patient is in no acute distress.  Skin: Skin is warm and dry. No rash noted.   Cardiovascular: Normal heart rate noted  Respiratory: Normal respiratory effort, no problems with respiration noted  Abdomen: Soft, gravid, appropriate for gestational age.  Pain/Pressure: Present     Pelvic: Cervical exam deferred        Extremities: Normal range of motion.  Edema: None  Mental Status: Normal mood and affect. Normal behavior. Normal judgment and thought content.   Assessment and Plan:  Pregnancy: [id number] 348w6d1. Supervision of other normal pregnancy, antepartum Postdates testing to start next week. IOL scheduled at [redacted] weeks gestation. - [place] FETAL BPP WO NON STRESS; Future Term labor symptoms and general obstetric precautions including but not limited to vaginal bleeding, contractions, leaking of fluid and fetal movement were reviewed in detail with the patient. Please refer to After Visit Summary for  other counseling recommendations.  Return in about 8 days (around 07/24/2018) for OB Visit and postdates NST.  Future Appointments  Date Time Provider DeHouse9/13/2019  3:15 PM WHBowman[facility]orea [facility]  10/30/2018  3:30 PM Magrinat, GuVirgie DadMD [facility] None    UgVerita SchneidersMD

## 2018-07-16 NOTE — Patient Instructions (Signed)
Labor Induction Labor induction is when steps are taken to cause a pregnant woman to begin the labor process. Most women go into labor on their own between 37 weeks and 42 weeks of the pregnancy. When this does not happen or when there is a medical need, methods may be used to induce labor. Labor induction causes a pregnant woman's uterus to contract. It also causes the cervix to soften (ripen), open (dilate), and thin out (efface). Usually, labor is not induced before 39 weeks of the pregnancy unless there is a problem with the baby or mother. Before inducing labor, your health care provider will consider a number of factors, including the following:  The medical condition of you and the baby.  How many weeks along you are.  The status of the baby's lung maturity.  The condition of the cervix.  The position of the baby. What are the reasons for labor induction? Labor may be induced for the following reasons:  The health of the baby or mother is at risk.  The pregnancy is overdue by 1 week or more.  The water breaks but labor does not start on its own.  The mother has a health condition or serious illness, such as high blood pressure, infection, placental abruption, or diabetes.  The amniotic fluid amounts are low around the baby.  The baby is distressed. Convenience or wanting the baby to be born on a certain date is not a reason for inducing labor. What methods are used for labor induction? Several methods of labor induction may be used, such as:  Prostaglandin medicine. This medicine causes the cervix to dilate and ripen. The medicine will also start contractions. It can be taken by mouth or by inserting a suppository into the vagina.  Inserting a thin tube (catheter) with a balloon on the end into the vagina to dilate the cervix. Once inserted, the balloon is expanded with water, which causes the cervix to open.  Stripping the membranes. Your health care provider separates  amniotic sac tissue from the cervix, causing the cervix to be stretched and causing the release of a hormone called progesterone. This may cause the uterus to contract. It is often done during an office visit. You will be sent home to wait for the contractions to begin. You will then come in for an induction.  Breaking the water. Your health care provider makes a hole in the amniotic sac using a small instrument. Once the amniotic sac breaks, contractions should begin. This may still take hours to see an effect.  Medicine to trigger or strengthen contractions. This medicine is given through an IV access tube inserted into a vein in your arm. All of the methods of induction, besides stripping the membranes, will be done in the hospital. Induction is done in the hospital so that you and the baby can be carefully monitored. How long does it take for labor to be induced? Some inductions can take up to 2-3 days. Depending on the cervix, it usually takes less time. It takes longer when you are induced early in the pregnancy or if this is your first pregnancy. If a mother is still pregnant and the induction has been going on for 2-3 days, either the mother will be sent home or a cesarean delivery will be needed. What are the risks associated with labor induction? Some of the risks of induction include:  Changes in fetal heart rate, such as too high, too low, or erratic.  Fetal distress.    Chance of infection for the mother and baby.  Increased chance of having a cesarean delivery.  Breaking off (abruption) of the placenta from the uterus (rare).  Uterine rupture (very rare). When induction is needed for medical reasons, the benefits of induction may outweigh the risks. What are some reasons for not inducing labor? Labor induction should not be done if:  It is shown that your baby does not tolerate labor.  You have had previous surgeries on your uterus, such as a myomectomy or the removal of  fibroids.  Your placenta lies very low in the uterus and blocks the opening of the cervix (placenta previa).  Your baby is not in a head-down position.  The umbilical cord drops down into the birth canal in front of the baby. This could cut off the baby's blood and oxygen supply.  You have had a previous cesarean delivery.  There are unusual circumstances, such as the baby being extremely premature. This information is not intended to replace advice given to you by your health care provider. Make sure you discuss any questions you have with your health care provider. Document Released: 03/21/2007 Document Revised: 04/06/2016 Document Reviewed: 05/29/2013 Elsevier Interactive Patient Education  2017 Elsevier Inc.  

## 2018-07-22 ENCOUNTER — Telehealth (HOSPITAL_COMMUNITY): Payer: Self-pay | Admitting: *Deleted

## 2018-07-22 NOTE — Telephone Encounter (Signed)
Preadmission screen  

## 2018-07-24 ENCOUNTER — Inpatient Hospital Stay (HOSPITAL_COMMUNITY)
Admission: AD | Admit: 2018-07-24 | Discharge: 2018-07-26 | DRG: 807 | Disposition: A | Discharge status: Home / Self Care | Payer: BC Managed Care – PPO | Attending: Obstetrics and Gynecology | Admitting: Obstetrics and Gynecology

## 2018-07-24 ENCOUNTER — Other Ambulatory Visit: Payer: Self-pay

## 2018-07-24 ENCOUNTER — Encounter: Payer: BC Managed Care – PPO | Admitting: Family Medicine

## 2018-07-24 ENCOUNTER — Encounter (HOSPITAL_COMMUNITY): Payer: Self-pay | Admitting: *Deleted

## 2018-07-24 DIAGNOSIS — O322XX0 Maternal care for transverse and oblique lie, not applicable or unspecified: Principal | ICD-10-CM | POA: Diagnosis present

## 2018-07-24 DIAGNOSIS — N96 Recurrent pregnancy loss: Secondary | ICD-10-CM

## 2018-07-24 DIAGNOSIS — Z3A40 40 weeks gestation of pregnancy: Secondary | ICD-10-CM

## 2018-07-24 DIAGNOSIS — Z3480 Encounter for supervision of other normal pregnancy, unspecified trimester: Secondary | ICD-10-CM

## 2018-07-24 DIAGNOSIS — Z3483 Encounter for supervision of other normal pregnancy, third trimester: Secondary | ICD-10-CM | POA: Diagnosis present

## 2018-07-24 LAB — CBC
HCT: 34.5 % — ABNORMAL LOW (ref 36.0–46.0)
HEMOGLOBIN: 11.2 g/dL — AB (ref 12.0–15.0)
MCH: 26.6 pg (ref 26.0–34.0)
MCHC: 32.5 g/dL (ref 30.0–36.0)
MCV: 81.9 fL (ref 78.0–100.0)
Platelets: 314 10*3/uL (ref 150–400)
RBC: 4.21 MIL/uL (ref 3.87–5.11)
RDW: 13.8 % (ref 11.5–15.5)
WBC: 10.6 10*3/uL — AB (ref 4.0–10.5)

## 2018-07-24 LAB — TYPE AND SCREEN
ABO/RH(D): A POS
ANTIBODY SCREEN: NEGATIVE

## 2018-07-24 LAB — RPR: RPR: NONREACTIVE

## 2018-07-24 LAB — ABO/RH: ABO/RH(D): A POS

## 2018-07-24 MED ORDER — LACTATED RINGERS IV SOLN: 500.0000 mL | INTRAVENOUS | Status: DC | PRN

## 2018-07-24 MED ORDER — LACTATED RINGERS IV SOLN
INTRAVENOUS | Status: DC
  Administered 2018-07-24: 14:00:00 via INTRAVENOUS

## 2018-07-24 MED ORDER — OXYTOCIN BOLUS FROM INFUSION
500.0000 mL | Freq: Once | INTRAVENOUS | Status: AC
  Administered 2018-07-24: 500 mL via INTRAVENOUS

## 2018-07-24 MED ORDER — SENNOSIDES-DOCUSATE SODIUM 8.6-50 MG PO TABS
2.0000 | ORAL_TABLET | ORAL | Status: DC
  Administered 2018-07-24 – 2018-07-25 (×2): 2 via ORAL
  Filled 2018-07-24 (×2): qty 2

## 2018-07-24 MED ORDER — SIMETHICONE 80 MG PO CHEW: 80.0000 mg | CHEWABLE_TABLET | ORAL | Status: DC | PRN

## 2018-07-24 MED ORDER — LIDOCAINE HCL (PF) 1 % IJ SOLN
30.0000 mL | INTRAMUSCULAR | Status: AC | PRN
  Administered 2018-07-24: 30 mL via SUBCUTANEOUS
  Filled 2018-07-24: qty 30

## 2018-07-24 MED ORDER — ACETAMINOPHEN 325 MG PO TABS: 650.0000 mg | ORAL_TABLET | ORAL | Status: DC | PRN

## 2018-07-24 MED ORDER — ONDANSETRON HCL 4 MG/2ML IJ SOLN: 4.0000 mg | INTRAMUSCULAR | Status: DC | PRN

## 2018-07-24 MED ORDER — ONDANSETRON HCL 4 MG/2ML IJ SOLN: 4.0000 mg | Freq: Four times a day (QID) | INTRAMUSCULAR | Status: DC | PRN

## 2018-07-24 MED ORDER — OXYTOCIN 40 UNITS IN LACTATED RINGERS INFUSION - SIMPLE MED
2.5000 [IU]/h | INTRAVENOUS | Status: DC
  Filled 2018-07-24: qty 1000

## 2018-07-24 MED ORDER — BENZOCAINE-MENTHOL 20-0.5 % EX AERO
1.0000 "application " | INHALATION_SPRAY | CUTANEOUS | Status: DC | PRN
  Filled 2018-07-24: qty 56

## 2018-07-24 MED ORDER — PRENATAL MULTIVITAMIN CH
1.0000 | ORAL_TABLET | Freq: Every day | ORAL | Status: DC
  Administered 2018-07-25 – 2018-07-26 (×2): 1 via ORAL
  Filled 2018-07-24 (×2): qty 1

## 2018-07-24 MED ORDER — OXYCODONE-ACETAMINOPHEN 5-325 MG PO TABS: 2.0000 | ORAL_TABLET | ORAL | Status: DC | PRN

## 2018-07-24 MED ORDER — TERBUTALINE SULFATE 1 MG/ML IJ SOLN
0.2500 mg | Freq: Once | INTRAMUSCULAR | Status: DC | PRN
  Filled 2018-07-24: qty 1

## 2018-07-24 MED ORDER — OXYCODONE-ACETAMINOPHEN 5-325 MG PO TABS: 1.0000 | ORAL_TABLET | ORAL | Status: DC | PRN

## 2018-07-24 MED ORDER — DIPHENHYDRAMINE HCL 25 MG PO CAPS: 25.0000 mg | ORAL_CAPSULE | Freq: Four times a day (QID) | ORAL | Status: DC | PRN

## 2018-07-24 MED ORDER — FLEET ENEMA 7-19 GM/118ML RE ENEM: 1.0000 | ENEMA | RECTAL | Status: DC | PRN

## 2018-07-24 MED ORDER — COCONUT OIL OIL: 1.0000 "application " | TOPICAL_OIL | Status: DC | PRN

## 2018-07-24 MED ORDER — WITCH HAZEL-GLYCERIN EX PADS: 1.0000 "application " | MEDICATED_PAD | CUTANEOUS | Status: DC | PRN

## 2018-07-24 MED ORDER — ZOLPIDEM TARTRATE 5 MG PO TABS: 5.0000 mg | ORAL_TABLET | Freq: Every evening | ORAL | Status: DC | PRN

## 2018-07-24 MED ORDER — OXYTOCIN 40 UNITS IN LACTATED RINGERS INFUSION - SIMPLE MED
1.0000 m[IU]/min | INTRAVENOUS | Status: DC
  Administered 2018-07-24: 1 m[IU]/min via INTRAVENOUS

## 2018-07-24 MED ORDER — LIDOCAINE HCL (PF) 1 % IJ SOLN
INTRAMUSCULAR | Status: AC
  Filled 2018-07-24: qty 30

## 2018-07-24 MED ORDER — ONDANSETRON HCL 4 MG PO TABS: 4.0000 mg | ORAL_TABLET | ORAL | Status: DC | PRN

## 2018-07-24 MED ORDER — FENTANYL CITRATE (PF) 100 MCG/2ML IJ SOLN
INTRAMUSCULAR | Status: AC
  Filled 2018-07-24: qty 2

## 2018-07-24 MED ORDER — TETANUS-DIPHTH-ACELL PERTUSSIS 5-2.5-18.5 LF-MCG/0.5 IM SUSP: 0.5000 mL | Freq: Once | INTRAMUSCULAR | Status: DC

## 2018-07-24 MED ORDER — OXYTOCIN 10 UNIT/ML IJ SOLN
INTRAMUSCULAR | Status: AC
  Filled 2018-07-24: qty 1

## 2018-07-24 MED ORDER — FENTANYL CITRATE (PF) 100 MCG/2ML IJ SOLN
50.0000 ug | Freq: Once | INTRAMUSCULAR | Status: AC
  Administered 2018-07-24: 50 ug via INTRAVENOUS

## 2018-07-24 MED ORDER — DIBUCAINE 1 % RE OINT: 1.0000 "application " | TOPICAL_OINTMENT | RECTAL | Status: DC | PRN

## 2018-07-24 MED ORDER — LIDOCAINE HCL (PF) 1 % IJ SOLN
30.0000 mL | Freq: Once | INTRAMUSCULAR | Status: AC
  Administered 2018-07-24: 30 mL via SUBCUTANEOUS

## 2018-07-24 MED ORDER — SOD CITRATE-CITRIC ACID 500-334 MG/5ML PO SOLN: 30.0000 mL | ORAL | Status: DC | PRN

## 2018-07-24 MED ORDER — IBUPROFEN 600 MG PO TABS
600.0000 mg | ORAL_TABLET | Freq: Four times a day (QID) | ORAL | Status: DC
  Administered 2018-07-24 – 2018-07-26 (×7): 600 mg via ORAL
  Filled 2018-07-24 (×8): qty 1

## 2018-07-24 NOTE — Progress Notes (Signed)
Labor curve reviewed. Progressing well. Stage II since 1100, pushing well and pushes to +2 station.

## 2018-07-24 NOTE — MAU Note (Signed)
PT SAYS  UC STRONG SINCE 9PM.   PNC  WITH [place].  VE 2 WEEKS AGO-  LITTLE.  DENIES HSV AND  MRSA. GBS-  NEG

## 2018-07-24 NOTE — Progress Notes (Signed)
OB/GYN Faculty Practice: Labor Progress Note  Subjective: Has been pushing for about 1h45 with short break to labor down on peanut ball. Has been pushing well, starting to feel tired. Involuntary pushing even while laboring down.  Objective: BP 115/83   Pulse (!) 53   Temp 97.7 F (36.5 C) (Oral)   Resp 16   Ht 5\' 8"  (1.727 m)   Wt 75.8 kg   LMP 10/17/2017   BMI 25.39 kg/m  Gen: hands and knees, appears tired Dilation: 10 Dilation Complete Date: 07/24/18 Dilation Complete Time: 1100 Effacement (%): 100 Cervical Position: Posterior Station: Plus 1 Presentation: Vertex Exam by:: Rhett Bannister, DO  Assessment and Plan: 32 y.o. [id number] [redacted]w[redacted]d here with spontaneous onset of labor.  Labor: 2.5hrs Second stage of labor. Pushing well. Infant is LOP. Will trial about 30 minutes of laboring down, starting some pitocin and placing IV with IVF. Discussed risks/benefits of vacuum.  -- consider pitocin with contractions to help with pushing prn  -- pain control: declines, natural   Fetal Well-Being: EFW 6-7lbs by Leopold's. Cephalic by sutures.  -- Category I - continuous fetal monitoring, early decelerations with pushing  -- GBS negative    Tao Satz S. Earlene Plater, DO OB/GYN Fellow, Faculty Practice  1:28 PM

## 2018-07-24 NOTE — Progress Notes (Signed)
OB/GYN Faculty Practice: Labor Progress Note  Subjective: Doing well. Wants to try AROM to see if helps speed up labor. Otherwise wants to be as natural as possible. Not feeling urge to push.  Objective: BP 105/74   Pulse 64   Temp 97.8 F (36.6 C) (Oral)   Resp 16   Ht 5\' 8"  (1.727 m)   Wt 75.8 kg   LMP 10/17/2017   BMI 25.39 kg/m  Gen: sitting on edge of bed, uncomfortable appearing, breathing through contractions. Dilation: 9 Effacement (%): 90 Cervical Position: Posterior Station: 0 Presentation: Vertex Exam by:: Rhett Bannister  Assessment and Plan: 32 y.o. [id number] [redacted]w[redacted]d here with spontaneous onset of labor.  Labor: Spontaneous onset of labor. No augmentation needed at this time, contracting well on own with cervical change. -- consider pitocin with contractions to help with pushing prn  -- pain control: declines, natural   Fetal Well-Being: EFW 6-7lbs by Leopold's. Cephalic by sutures.  -- Category I - continuous fetal monitoring, early decelerations with pushing  -- GBS negative    Corlis Angelica S. Earlene Plater, DO OB/GYN Fellow, Faculty Practice  9:26 AM

## 2018-07-24 NOTE — Lactation Note (Signed)
This note was copied from a baby's chart. Lactation Consultation Note  Patient Name: Dominique Cannon [id number] Date: 07/24/2018 Reason for consult: Initial assessment;Primapara;1st time breastfeeding;Term  5 hours old FT female who is being exclusively BF by his mother, she's a P1. Mom took BF classes here at [facility] and she already knows how to hand express. Mom has already ordered a Spectra DEBP and it's coming in the mail. RN was assisting mom with hand expression and spoon feeding when entering the room; showed mom how to spoon feed and finger feed baby, as well as burping.  Offered assistance with latch, LC took baby STS to the right breast in cross cradle hold and baby was able to latch easily, had to break the latch a couple of times because he was clicking but after he got on, he was able to sustain the latch for the entire 15 minute feeding. Audible swallows were noted, and both of mom's nipples looked intact upon examination. Discussed tips for a deep latch and prevention of sore nipples; as well as cluster feeding and newborn sleeping cycle.  Plan:  1. Encouraged mom to feed baby STS 8-12 times/24 hours or sooner if feeding cues are present. If baby is not cueing in a 3 hour period mom will place him STS to the breast to give him an opportunity to feed. 2. Hand expression and spoon/finger feeding EBM was encouraged 3. Mom will let her RN know if she's ready to start double pumping, baby is bruised on the head  Reviewed BF brochure, BF resources and feeding diary, both parents are aware of LC services and will call PRN.  Maternal Data Formula Feeding for Exclusion: No Has patient been taught Hand Expression?: Yes Does the patient have breastfeeding experience prior to this delivery?: No  Feeding Feeding Type: Breast Fed Length of feed: 15 min  LATCH Score Latch: Grasps breast easily, tongue down, lips flanged, rhythmical sucking.  Audible Swallowing: A few with stimulation  Type of  Nipple: Everted at rest and after stimulation  Comfort (Breast/Nipple): Soft / non-tender(no signs of trauma, nipples are intact)  Hold (Positioning): Assistance needed to correctly position infant at breast and maintain latch.  LATCH Score: 8  Interventions Interventions: Breast feeding basics reviewed;Assisted with latch;Skin to skin;Breast massage;Hand express;Breast compression;Adjust position;Support pillows  Lactation Tools Discussed/Used WIC Program: No   Consult Status Consult Status: Follow-up Date: 07/25/18 Follow-up type: In-patient    Starlina Lapre Venetia Constable 07/24/2018, 8:35 PM

## 2018-07-24 NOTE — Progress Notes (Signed)
Patient wishes not to have IV access at this time. Counseled on indications for IV access and fluids. Dr. Fara Boros okay to proceed without IV access at this time. E. Ellisha Bankson RN

## 2018-07-24 NOTE — Anesthesia Pain Management Evaluation Note (Signed)
  CRNA Pain Management Visit Note  Patient: Dominique Cannon, 32 y.o., female  "Hello I am a member of the anesthesia team at [facility]. We have an anesthesia team available at all times to provide care throughout the hospital, including epidural management and anesthesia for C-section. I don't know your plan for the delivery whether it a natural birth, water birth, IV sedation, nitrous supplementation, doula or epidural, but we want to meet your pain goals."   1.Was your pain managed to your expectations on prior hospitalizations?   Yes   2.What is your expectation for pain management during this hospitalization?     Labor support without medications  3.How can we help you reach that goal? unsure  Record the patient's initial score and the patient's pain goal.   Pain: 10  Pain Goal: 10 [facility] wants you to be able to say your pain was always managed very well.  Cephus Shelling 07/24/2018

## 2018-07-24 NOTE — H&P (Addendum)
OBSTETRIC ADMISSION HISTORY AND PHYSICAL  Dominique Cannon is a 32 y.o. female G3P0020 with IUP at 35w0dby LMP presenting for SOL. Contractions started yesterday AM, progressively increased in intensity and frequency, now q571m. Hx multiple miscarriages, D&C. Pregnancy has been uncomplicated.  She reports +FMs, No LOF, no VB, no blurry vision, headaches or peripheral edema, and RUQ pain.  She plans on breast feeding. She request IUD for birth control. [facility] Dating: By LMP --->  Estimated Date of Delivery: 07/24/18  Sono:    '@[redacted]w[redacted]d'$ , CWD, normal anatomy, transverse presentation, 356g, 53% EFW   Prenatal History/Complications:  Past Medical History: Past Medical History:  Diagnosis Date  . BRCA2 positive   . Family history of breast cancer   . Family history of breast cancer in mother    mother age 5662ied of breast cancer  . Family history of ovarian cancer     Past Surgical History: Past Surgical History:  Procedure Laterality Date  . DILATION AND EVACUATION N/A 01/16/2017   Procedure: DILATATION AND EVACUATION;  Surgeon: MaMegan SalonMD;  [facility];  Service: Gynecology;  Laterality: N/A;  . INTRAUTERINE DEVICE INSERTION  07/04/11   Mirena IUD insertion by Dr. SuEdwinna Areola. WISDOM TOOTH EXTRACTION      Obstetrical History: OB History    Gravida  3   Para  0   Term  0   Preterm  0   AB  2   Living  0     SAB  2   TAB  0   Ectopic  0   Multiple  0   Live Births  0           Social History: Social History   Socioeconomic History  . Marital status: Married    Spouse name: Not on file  . Number of children: Not on file  . Years of education: Not on file  . Highest education level: Not on file  Occupational History  . Not on file  Social Needs  . Financial resource strain: Not on file  . Food insecurity:    Worry: Not on file    Inability: Not on file  . Transportation needs:    Medical: Not on  file    Non-medical: Not on file  Tobacco Use  . Smoking status: Never Smoker  . Smokeless tobacco: Never Used  Substance and Sexual Activity  . Alcohol use: Yes    Alcohol/week: 1.0 standard drinks    Types: 1 Standard drinks or equivalent per week    Comment: occ  . Drug use: No  . Sexual activity: Yes    Partners: Male    Birth control/protection: None  Lifestyle  . Physical activity:    Days per week: Not on file    Minutes per session: Not on file  . Stress: Not on file  Relationships  . Social connections:    Talks on phone: Not on file    Gets together: Not on file    Attends religious service: Not on file    Active member of club or organization: Not on file    Attends meetings of clubs or organizations: Not on file    Relationship status: Not on file  Other Topics Concern  . Not on file  Social History Narrative  . Not on file    Family History: Family History  Problem Relation Age of Onset  . Breast  cancer Mother 25  . Ovarian cancer Maternal Grandmother        dx in her 39s  . Breast cancer Paternal Grandmother   . Cancer Maternal Uncle        NOS  . Other Other        positive genetic testing for "the gene"  . Breast cancer Other        MGF's sister  . Breast cancer Other        MGF's mother    Allergies: No Known Allergies  Medications Prior to Admission  Medication Sig Dispense Refill Last Dose  . Misc. Devices (BREAST PUMP) MISC 1 Units by Does not apply route as needed. 1 each 0 Taking  . Prenatal Vit-Fe Fumarate-FA (PRENATAL VITAMIN PO) Take by mouth daily.   Taking     Review of Systems   All systems reviewed and negative except as stated in HPI  Last menstrual period 10/17/2017. General appearance: alert, cooperative, appears stated age and mild distress Lungs: clear to auscultation bilaterally Heart: regular rate and rhythm Abdomen: soft, non-tender; bowel sounds normal Pelvic: 8/100/-1, soft, posterior Extremities: Homans sign  is negative, no sign of DVT DTR's 2+ Presentation: cephalic Fetal monitoringBaseline: 125 bpm, Variability: Good {> 6 bpm), Accelerations: Reactive and Decelerations: Absent Uterine activityFrequency: Every 5 minutes Dilation: 7 Effacement (%): 90 Station: -1 Exam by:: DCALLAWAY, RN     Prenatal labs: ABO, Rh: A/Positive/-- (02/27 1600) Antibody: Negative (02/27 1600) Rubella: 2.45 (02/27 1600) RPR: Non Reactive (06/19 0824)  HBsAg: Negative (02/27 1600)  HIV: Non Reactive (06/19 0824)  GBS:    1 hr Glucola passed Genetic screening  neg Anatomy US normal  Prenatal Transfer Tool  Maternal Diabetes: No Genetic Screening: Normal Maternal Ultrasounds/Referrals: Normal Fetal Ultrasounds or other Referrals:  Referred to Materal Fetal Medicine  Maternal Substance Abuse:  No Significant Maternal Medications:  None Significant Maternal Lab Results: None  No results found for this or any previous visit (from the past 24 hour(s)).  Patient Active Problem List   Diagnosis Date Noted  . Indication for care in labor and delivery, antepartum 07/24/2018  . Supervision of other normal pregnancy, antepartum 12/26/2017  . 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 Genetic testing 09/27/2015  . 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 Family history of breast cancer   . Family history of ovarian cancer     Assessment/Plan:  Dominique Cannon is a 32 y.o. G3P0020 at 52w0dhere for SOL.  #Labor:Progressing well on her own. Hold off on any intervention. Recheck 1 hr. #Pain: Declines epidural, would like to avoid IV medications #FWB: Cat 1 FHT #ID:  GBS neg #MOF: breast #MOC:IUD outpt #Circ:  Yes, if female  KDemetrius Revel MD  07/24/2018, 4:49 AM    I spoke with and examined patient and agree with resident/PA/SNM's note and plan of care.  KRoma Schanz CNM, [facility]9/09/2018 6:14 AM

## 2018-07-25 LAB — CBC
HCT: 28.7 % — ABNORMAL LOW (ref 36.0–46.0)
Hemoglobin: 9.5 g/dL — ABNORMAL LOW (ref 12.0–15.0)
MCH: 27.4 pg (ref 26.0–34.0)
MCHC: 33.1 g/dL (ref 30.0–36.0)
MCV: 82.7 fL (ref 78.0–100.0)
PLATELETS: 271 10*3/uL (ref 150–400)
RBC: 3.47 MIL/uL — AB (ref 3.87–5.11)
RDW: 14.2 % (ref 11.5–15.5)
WBC: 17 10*3/uL — AB (ref 4.0–10.5)

## 2018-07-25 NOTE — Progress Notes (Signed)
Post Partum Day 1 Subjective: no complaints, up ad lib, voiding and tolerating PO  Objective: Blood pressure 122/71, pulse 80, temperature 98.2 F (36.8 C), temperature source Oral, resp. rate 16, height 5\' 8"  (1.727 m), weight 75.8 kg, last menstrual period 10/17/2017, SpO2 99 %, unknown if currently breastfeeding.  Physical Exam:  General: alert, cooperative and no distress Lochia: appropriate Uterine Fundus: firm Perineum: healing well DVT Evaluation: No evidence of DVT seen on physical exam.  Recent Labs    07/24/18 0534 07/25/18 0620  HGB 11.2* 9.5*  HCT 34.5* 28.7*    Assessment/Plan: Plan for discharge tomorrow and Breastfeeding   LOS: 1 day   Wynelle BourgeoisMarie Stela Iwasaki 07/25/2018, 7:24 AM

## 2018-07-25 NOTE — Lactation Note (Signed)
This note was copied from a baby's chart. Lactation Consultation Note  Patient Name: Boy Tori MilksMeredith Baccari ZOXWR'UToday's Date: 07/25/2018 Reason for consult: Follow-up assessment;1st time breastfeeding;Primapara;Term;Infant weight loss  22 hours old FT female who is being exclusively BF by his mother, she's a P1. Baby is at 2% weight loss, mom asked LC for assistance. RN Selena BattenKim had already gone to the room to assist mother, [facility] was nursing already, but he was loosing some depth on the latch. Repositioned baby, discussed tips for a deep latch with parents, mom had a chance to do some hand expression and she got two small droplets of colostrum.  LC took baby STS to the right breast in cross cradle position and he was able to latch with very little difficulty; audible swallows noted when LC was doing breast compressions. Showed mom how to do it, but she still have her IV on her hand, it was a little challenging for her at this point, but she understood the dynamics of it, and she'll try it. Mom got a hand pump this morning.  Plan:  1. Encouraged mom to feed baby STS 8-12 times/24 hours or sooner if feeding cues are present. If baby is not cueing in a 3 hour period mom will place him STS to the breast to give him an opportunity to feed. 2. Hand expression and spoon/finger feeding EBM was encouraged 3. Mom will start using her hand pump for pre-pumping prior feedings, dad asked about it, benefits of pumping were also discussed  Parents reported all questions were answered, they're both aware of LC services and will call PRN.  Maternal Data    Feeding Feeding Type: Breast Fed Length of feed: 14 min(Baby still nursing when exiting the room)  LATCH Score Latch: Repeated attempts needed to sustain latch, nipple held in mouth throughout feeding, stimulation needed to elicit sucking reflex.  Audible Swallowing: A few with stimulation  Type of Nipple: Everted at rest and after  stimulation(short)  Comfort (Breast/Nipple): Soft / non-tender  Hold (Positioning): No assistance needed to correctly position infant at breast.  LATCH Score: 8  Interventions Interventions: Breast feeding basics reviewed;Assisted with latch;Skin to skin;Breast massage;Hand express;Adjust position;Breast compression;Support pillows;Expressed milk;Hand pump  Lactation Tools Discussed/Used Tools: Pump Breast pump type: Manual   Consult Status Consult Status: Follow-up Date: 07/26/18 Follow-up type: In-patient    Shamariah Shewmake Venetia ConstableS Gurdeep Keesey 07/25/2018, 1:52 PM

## 2018-07-26 ENCOUNTER — Other Ambulatory Visit (HOSPITAL_COMMUNITY): Payer: BC Managed Care – PPO

## 2018-07-26 NOTE — Discharge Instructions (Signed)
Vaginal Delivery, Care After °Refer to this sheet in the next few weeks. These instructions provide you with information about caring for yourself after vaginal delivery. Your health care provider may also give you more specific instructions. Your treatment has been planned according to current medical practices, but problems sometimes occur. Call your health care provider if you have any problems or questions. °What can I expect after the procedure? °After vaginal delivery, it is common to have: °· Some bleeding from your vagina. °· Soreness in your abdomen, your vagina, and the area of skin between your vaginal opening and your anus (perineum). °· Pelvic cramps. °· Fatigue. ° °Follow these instructions at home: °Medicines °· Take over-the-counter and prescription medicines only as told by your health care provider. °· If you were prescribed an antibiotic medicine, take it as told by your health care provider. Do not stop taking the antibiotic until it is finished. °Driving ° °· Do not drive or operate heavy machinery while taking prescription pain medicine. °· Do not drive for 24 hours if you received a sedative. °Lifestyle °· Do not drink alcohol. This is especially important if you are breastfeeding or taking medicine to relieve pain. °· Do not use tobacco products, including cigarettes, chewing tobacco, or e-cigarettes. If you need help quitting, ask your health care provider. °Eating and drinking °· Drink at least 8 eight-ounce glasses of water every day unless you are told not to by your health care provider. If you choose to breastfeed your baby, you may need to drink more water than this. °· Eat high-fiber foods every day. These foods may help prevent or relieve constipation. High-fiber foods include: °? Whole grain cereals and breads. °? Brown rice. °? Beans. °? Fresh fruits and vegetables. °Activity °· Return to your normal activities as told by your health care provider. Ask your health care provider  what activities are safe for you. °· Rest as much as possible. Try to rest or take a nap when your baby is sleeping. °· Do not lift anything that is heavier than your baby or 10 lb (4.5 kg) until your health care provider says that it is safe. °· Talk with your health care provider about when you can engage in sexual activity. This may depend on your: °? Risk of infection. °? Rate of healing. °? Comfort and desire to engage in sexual activity. °Vaginal Care °· If you have an episiotomy or a vaginal tear, check the area every day for signs of infection. Check for: °? More redness, swelling, or pain. °? More fluid or blood. °? Warmth. °? Pus or a bad smell. °· Do not use tampons or douches until your health care provider says this is safe. °· Watch for any blood clots that may pass from your vagina. These may look like clumps of dark red, brown, or black discharge. °General instructions °· Keep your perineum clean and dry as told by your health care provider. °· Wear loose, comfortable clothing. °· Wipe from front to back when you use the toilet. °· Ask your health care provider if you can shower or take a bath. If you had an episiotomy or a perineal tear during labor and delivery, your health care provider may tell you not to take baths for a certain length of time. °· Wear a bra that supports your breasts and fits you well. °· If possible, have someone help you with household activities and help care for your baby for at least a few days after   you leave the hospital. °· Keep all follow-up visits for you and your baby as told by your health care provider. This is important. °Contact a health care provider if: °· You have: °? Vaginal discharge that has a bad smell. °? Difficulty urinating. °? Pain when urinating. °? A sudden increase or decrease in the frequency of your bowel movements. °? More redness, swelling, or pain around your episiotomy or vaginal tear. °? More fluid or blood coming from your episiotomy or  vaginal tear. °? Pus or a bad smell coming from your episiotomy or vaginal tear. °? A fever. °? A rash. °? Little or no interest in activities you used to enjoy. °? Questions about caring for yourself or your baby. °· Your episiotomy or vaginal tear feels warm to the touch. °· Your episiotomy or vaginal tear is separating or does not appear to be healing. °· Your breasts are painful, hard, or turn red. °· You feel unusually sad or worried. °· You feel nauseous or you vomit. °· You pass large blood clots from your vagina. If you pass a blood clot from your vagina, save it to show to your health care provider. Do not flush blood clots down the toilet without having your health care provider look at them. °· You urinate more than usual. °· You are dizzy or light-headed. °· You have not breastfed at all and you have not had a menstrual period for 12 weeks after delivery. °· You have stopped breastfeeding and you have not had a menstrual period for 12 weeks after you stopped breastfeeding. °Get help right away if: °· You have: °? Pain that does not go away or does not get better with medicine. °? Chest pain. °? Difficulty breathing. °? Blurred vision or spots in your vision. °? Thoughts about hurting yourself or your baby. °· You develop pain in your abdomen or in one of your legs. °· You develop a severe headache. °· You faint. °· You bleed from your vagina so much that you fill two sanitary pads in one hour. °This information is not intended to replace advice given to you by your health care provider. Make sure you discuss any questions you have with your health care provider. °Document Released: 10/27/2000 Document Revised: 04/12/2016 Document Reviewed: 11/14/2015 °Elsevier Interactive Patient Education © 2018 [employer] ° °

## 2018-07-26 NOTE — Discharge Summary (Addendum)
Attestation: I have seen this patient and agree with the resident's documentation. I have examined them separately, and we have discussed the plan of care.  Cristal DeerLaurel S. Earlene PlaterWallace, DO OB/GYN Fellow  OB Discharge Summary    Patient Name: Dominique Cannon DOB: 06/29/1986 MRN: [id number]  Date of admission: 07/24/2018 Delivering MD: Rolm BookbinderNEILL, CAROLINE M   Date of discharge: 07/26/2018  Admitting diagnosis: 40WKS CTX Intrauterine pregnancy: [id number]     Secondary diagnosis:  Active Problems:   Indication for care in labor and delivery, antepartum  Additional problems: none     Discharge diagnosis: Term Pregnancy Delivered                                                                                                Post partum procedures:none  Augmentation: Pitocin  Complications: None  Hospital course:  Onset of Labor With Vaginal Delivery     32 y.o. yo [id number] at [redacted]w[redacted]d was admitted in Latent Labor on 07/24/2018. Patient had an uncomplicated labor course as follows:  Membrane Rupture Time/Date: 9:11 AM ,07/24/2018   Intrapartum Procedures: Episiotomy: None [1]                                         Lacerations:  2nd degree [3];Labial [10]  Patient had a delivery of a Viable infant. 07/24/2018  Information for the patient's newborn:  Randa NgoLeib, Boy Cinnamon [[id number]][[id number]]  Delivery Method: Vaginal, Spontaneous(Filed from Delivery Summary)    Pateint had an uncomplicated postpartum course.  She is ambulating, tolerating a regular diet, passing flatus, and urinating well. Patient is discharged home in stable condition on 07/26/18.   Physical exam  Vitals:   07/25/18 0745 07/25/18 1431 07/25/18 2253 07/26/18 0558  BP: 118/81 114/85 120/75 110/85  Pulse: 71 71 66 (!) 56  Resp: 16 18 18 18   Temp: 98 F (36.7 C) 97.6 F (36.4 C)  97.8 F (36.6 C)  TempSrc: Oral Oral    SpO2:  98% 98% 99%  Weight:      Height:       General: alert, cooperative and no distress Lochia: appropriate Uterine Fundus:  firm Incision: N/A DVT Evaluation: No evidence of DVT seen on physical exam. Labs: Lab Results  Component Value Date   WBC 17.0 (H) 07/25/2018   HGB 9.5 (L) 07/25/2018   HCT 28.7 (L) 07/25/2018   MCV 82.7 07/25/2018   PLT 271 07/25/2018   No flowsheet data found.  Discharge instruction: per After Visit Summary and "Baby and Me Booklet".  After visit meds:  Allergies as of 07/26/2018   No Known Allergies     Medication List    TAKE these medications   Breast Pump Misc 1 Units by Does not apply route as needed.   PRENATAL VITAMIN PO Take 1 tablet by mouth daily.       Diet: routine diet  Activity: Advance as tolerated. Pelvic rest for 6 weeks.   Outpatient follow up:4 weeks Follow up Appt: Future Appointments  Date Time Provider  Department Center  08/28/2018 10:00 AM Calvert Cantor, CNM [facility]  10/30/2018  3:30 PM Magrinat, Valentino Hue, MD [facility] None   Follow up Visit:No follow-ups on file.  Postpartum contraception: IUD   Newborn Data: Live born female  Birth Weight: 8 lb 1.3 oz (3665 g) APGAR: 8, 9  Newborn Delivery   Birth date/time:  07/24/2018 15:19:00 Delivery type:  Vaginal, Spontaneous     Baby Feeding: Breast Disposition:home with mother   07/26/2018 Mirian Mo, MD

## 2018-07-26 NOTE — Lactation Note (Signed)
This note was copied from a baby's chart. Lactation Consultation Note  Patient Name: Boy Tori MilksMeredith Delapena RUEAV'WToday's Date: 07/26/2018 Reason for consult: Follow-up assessment;Infant weight loss  Visited with P1 Mom of term baby at 42 hrs.  Baby at 7.6% weight loss, output 3 voids and 2 stools last 24 hrs.  Mom reports baby is latching much easier now, and he cluster fed over night, hearing swallowing for first 15 mins.  Baby sleeping STS on Mom's chest after breastfeeding.  Mom using cross cradle hold.  Reviewed importance of a deep latch to breast, not feeling any pinching.  Mom reports no blistering, no cracking of nipples, just a little tenderness on initial latch.  Mom to use her colostrum on the nipples and will try coconut oil once she is home.  Encouraged continued STS, and feeding baby often on cue.  Goal of 8-12 feedings per 24 hrs shared.  Engorgement prevention and treatment discussed.  Mom has a hand pump, and expects her DEBP to be in the mail from her insurance carrier.  Mom aware of OP lactation support available to her.  Encouraged her to call prn.  Consult Status Consult Status: Complete Date: 07/26/18 Follow-up type: Call as needed    Judee ClaraSmith, Kaycee Mcgaugh E 07/26/2018, 9:41 AM

## 2018-07-26 NOTE — Procedures (Signed)
Post-Delivery Procedure Note  I was present post-delivery and asked to assist with laceration repair. Patient had a second degree perineal laceration with a right labial tear. Repair was performed in the usual fashion using 3-0 Vicryl and 4-0 Rapide. Approximately 25ml of lidocaine used for analgesia. Good hemostasis was noted after completion of the repair.    Cristal DeerLaurel S. Earlene PlaterWallace, DO OB/GYN Fellow

## 2018-07-31 ENCOUNTER — Inpatient Hospital Stay (HOSPITAL_COMMUNITY): Admission: RE | Admit: 2018-07-31 | Payer: BC Managed Care – PPO | Source: Ambulatory Visit

## 2018-08-27 ENCOUNTER — Encounter: Payer: Self-pay | Admitting: Advanced Practice Midwife

## 2018-08-28 ENCOUNTER — Ambulatory Visit (INDEPENDENT_AMBULATORY_CARE_PROVIDER_SITE_OTHER): Payer: BC Managed Care – PPO | Admitting: Advanced Practice Midwife

## 2018-08-28 DIAGNOSIS — Z1389 Encounter for screening for other disorder: Secondary | ICD-10-CM | POA: Diagnosis not present

## 2018-08-28 NOTE — Progress Notes (Signed)
Post Partum Exam  Dominique Cannon is a 32 y.o. G64P1021 female who presents for a postpartum visit. She is 5 weeks postpartum following a  spontaneous vaginal delivery on 07/24/2018, EBL . I have fully reviewed the prenatal and intrapartum course. The delivery was at 40.0 gestational weeks.  Anesthesia: none. Postpartum course has been uncomplicated. Baby's course has been uncomplicated. Baby is feeding by breast. Bleeding staining only. Bowel function is normal. Bladder function is normal. Patient is not sexually active. Contraception method is condoms. Postpartum depression screening:neg   Last pap smear done 07/2016 and was Normal  Review of Systems Pertinent items are noted in HPI.    Objective:  unknown if currently breastfeeding.  General:  alert, cooperative, appears stated age and no distress   Breasts:  inspection negative, no nipple discharge or bleeding, no masses or nodularity palpable  Lungs: clear to auscultation bilaterally  Heart:  regular rate and rhythm, S1, S2 normal, no murmur, click, rub or gallop  Abdomen: soft, non-tender; bowel sounds normal; no masses,  no organomegaly   Vulva:  normal  Vagina: normal vagina, no discharge, exudate, lesion, or erythema  Cervix:  Declines internal exam  Corpus: not examined  Adnexa:  not evaluated  Rectal Exam: Not performed.        Assessment:    Normal postpartum exam. Pap smear not done. Patient declines contraction. Plans to conduct all f/u with her primary GYN  Plan:   1. Contraception: none 2. Follow up in: 1 year or as needed.   Clayton Bibles, CNM 08/28/18 11:37 AM

## 2018-08-29 ENCOUNTER — Telehealth: Payer: Self-pay | Admitting: Obstetrics & Gynecology

## 2018-08-29 NOTE — Telephone Encounter (Signed)
Left message to call Maylen Waltermire, RN at [facility] [phone].   

## 2018-08-29 NOTE — Telephone Encounter (Signed)
Patient is asking to talk with Dr.Miller's and some appointments that needs to schedule, breast imaging, pap and ultrasound.

## 2018-08-29 NOTE — Telephone Encounter (Signed)
Spoke with patient. S/p vaginal delivery on 07/24/18. Had her postpartum visit on 08/28/18, no pap, ok to return to GYN.   1. Last AEX and pap 07/31/16. Scheduled AEX for 09/20/18 at 2pm with Dr. Sabra Heck.   2. Yearly MMG and breast MRIs for increased risk of breast cancer, positive BRCA 2.  Last MMG 09/07/17, MRI 09/29/17 Asking when she can resume screening if breastfeeding?   3. Last PUS and Ca125 08/15/16 for increased genetic risk of ovarian cancer, recommendations for next PUS?   Advised will review with Dr. Sabra Heck and return call. Patient agreeable.   Dr. Sabra Heck -please advise.

## 2018-09-03 NOTE — Telephone Encounter (Signed)
Left message to call Yachet Mattson, RN at [facility] [phone].   

## 2018-09-03 NOTE — Telephone Encounter (Signed)
Spoke with patient, advised as seen below per Dr. Hyacinth Meeker.   1. Patient states she is unsure yet how long she plans to breast feed. Patient states she has a yearly f/u with Dr. Darnelle Catalan in December 2019, will review and re-evaluate breast feeding at that time.   2.Patient declines to schedule PUS at this time, will discuss further at AEX 09/20/18. Patient thankful for return call.   No orders placed.   Routing to provider for final review. Patient is agreeable to disposition. Will close encounter.

## 2018-09-03 NOTE — Telephone Encounter (Signed)
Please let pt know that you contacted the [facility] to confirm when [facility] and MRI should be done.  1) if going to breast feed for more than a year, can proceed with MMG and MRI now.  I suspect this is her plan but please confirm with her.  Both of these imaging studies are best having just nursed or pumped right before test is done.  2).  She can come for the PUS and ca-125 anytime as well.    Ok to proceed with any scheduling of above at this time.  Thanks.

## 2018-09-20 ENCOUNTER — Ambulatory Visit: Payer: BC Managed Care – PPO | Admitting: Obstetrics & Gynecology

## 2018-09-20 ENCOUNTER — Encounter: Payer: Self-pay | Admitting: Obstetrics & Gynecology

## 2018-09-20 ENCOUNTER — Other Ambulatory Visit (HOSPITAL_COMMUNITY)
Admission: RE | Admit: 2018-09-20 | Discharge: 2018-09-20 | Disposition: A | Discharge status: Home / Self Care | Payer: BC Managed Care – PPO | Source: Ambulatory Visit | Attending: Obstetrics & Gynecology | Admitting: Obstetrics & Gynecology

## 2018-09-20 ENCOUNTER — Other Ambulatory Visit: Payer: Self-pay

## 2018-09-20 VITALS — BP 128/80 | HR 84 | Resp 16 | Ht 68.0 in | Wt 147.8 lb

## 2018-09-20 DIAGNOSIS — Z1501 Genetic susceptibility to malignant neoplasm of breast: Secondary | ICD-10-CM | POA: Diagnosis not present

## 2018-09-20 DIAGNOSIS — Z1509 Genetic susceptibility to other malignant neoplasm: Secondary | ICD-10-CM | POA: Diagnosis not present

## 2018-09-20 DIAGNOSIS — Z124 Encounter for screening for malignant neoplasm of cervix: Secondary | ICD-10-CM | POA: Diagnosis present

## 2018-09-20 DIAGNOSIS — Z01419 Encounter for gynecological examination (general) (routine) without abnormal findings: Secondary | ICD-10-CM

## 2018-09-20 NOTE — Progress Notes (Signed)
32 y.o. G53P1021 Married White or Caucasian female here for annual exam.  Doing well.  Has two month old son.  She is breast feeding.    Has appt with Dr. Jana Hakim next month.  D/w pt breast MRI.  Breast Center suggested no imaging until after no breast feeding for > 6 months unless she is going to breast feed for longer than a year and, in that case, could proceed now.  D/w pt I found this recommendation very confusing so I would like Dr. Virgie Dad imput.  Abbreviated breast MRI discussed with pt as well for screening.    Not sure what she wants to use for contraception, if anything.  They do want to try again for another pregnancy.  Not sure yet about timing.    Will plan PUS at end of December.   Patient's last menstrual period was 07/24/2018 (approximate).          Sexually active: Yes.    The current method of family planning is none.    Exercising: Yes.    walking Smoker:  no  Health Maintenance: Pap:  08/02/16 Neg   07/23/15 Neg  History of abnormal Pap:  no MMG:  09/29/17 MRI Bilateral BIRADS1:neg. Screening due 08/2018  Colonoscopy:  n/a BMD:   n/a TDaP:  05/01/2018 Screening Labs: PCP   reports that she has never smoked. She has never used smokeless tobacco. She reports that she drank alcohol. She reports that she does not use drugs.  Past Medical History:  Diagnosis Date  . BRCA2 positive   . Family history of breast cancer   . Family history of breast cancer in mother    mother age 35 died of breast cancer  . Family history of ovarian cancer   . Indication for care in labor and delivery, antepartum 07/24/2018  . Supervision of other normal pregnancy, antepartum 12/26/2017    BABYSCRIPTS PATIENT: '[ ]'$  initial, '[ ]'$  12, '[ ]'$  20, '[ ]'$  28, '[ ]'$  32, '[ ]'$  36, '[ ]'$  38, '[ ]'$  39, '[ ]'$  40   Clinic  [facility] Prenatal Labs Dating LMP Blood type: A/Positive/-- (02/27 1600)  Genetic Screen Declined Antibody:Negative (02/27 1600) Anatomic [place]  normal needs fup end of june Rubella: 2.45 (02/27 1600)  GTT Third trimester: neg RPR: Non Reactive (02/27 1600)  Flu vaccine  07/01/18 HBsAg: Negative (    Past Surgical History:  Procedure Laterality Date  . DILATION AND EVACUATION N/A 01/16/2017   Procedure: DILATATION AND EVACUATION;  Surgeon: Megan Salon, MD;  Location: [facility];  Service: Gynecology;  Laterality: N/A;  . INTRAUTERINE DEVICE INSERTION  07/04/11   Mirena IUD insertion by Dr. Edwinna Areola  . WISDOM TOOTH EXTRACTION      Current Outpatient Medications  Medication Sig Dispense Refill  . Misc. Devices (BREAST PUMP) MISC 1 Units by Does not apply route as needed. 1 each 0  . Prenatal Vit-Fe Fumarate-FA (PRENATAL VITAMIN PO) Take 1 tablet by mouth daily.      No current facility-administered medications for this visit.     Family History  Problem Relation Age of Onset  . Breast cancer Mother 67  . Ovarian cancer Maternal Grandmother        dx in her 36s  . Breast cancer Paternal Grandmother   . Cancer Maternal Uncle        NOS  . Other Other        positive genetic testing for "the gene"  . Breast cancer  Other        MGF's sister  . Breast cancer Other        MGF's mother    Review of Systems  All other systems reviewed and are negative.   Exam:   BP 128/80 (BP Location: Right Arm, Patient Position: Sitting, Cuff Size: Normal)   Pulse 84   Resp 16   Ht '5\' 8"'$  (1.727 m)   Wt 147 lb 12.8 oz (67 kg)   LMP 07/24/2018 (Approximate)   Breastfeeding? Yes   BMI 22.47 kg/m    Height: '5\' 8"'$  (172.7 cm)  Ht Readings from Last 3 Encounters:  09/20/18 '5\' 8"'$  (1.727 m)  08/28/18 '5\' 8"'$  (1.727 m)  07/24/18 '5\' 8"'$  (1.727 m)    General appearance: alert, cooperative and appears stated age Head: Normocephalic, without obvious abnormality, atraumatic Neck: no adenopathy, supple, symmetrical, trachea midline and thyroid normal to inspection and palpation Lungs: clear to auscultation bilaterally Breasts: normal appearance, no masses or tenderness Heart: regular rate and  rhythm Abdomen: soft, non-tender; bowel sounds normal; no masses,  no organomegaly Extremities: extremities normal, atraumatic, no cyanosis or edema Skin: Skin color, texture, turgor normal. No rashes or lesions Lymph nodes: Cervical, supraclavicular, and axillary nodes normal. No abnormal inguinal nodes palpated Neurologic: Grossly normal   Pelvic: External genitalia:  no lesions              Urethra:  normal appearing urethra with no masses, tenderness or lesions              Bartholins and Skenes: normal                 Vagina: normal appearing vagina with normal color and discharge, no lesions              Cervix: no lesions              Pap taken: Yes.   Bimanual Exam:  Uterus:  normal size, contour, position, consistency, mobility, non-tender              Adnexa: normal adnexa and no mass, fullness, tenderness               Rectovaginal: Confirms               Anus:  normal sphincter tone, no lesions  Chaperone was present for exam.  A:  Well Woman with normal exam H/O BRCA 2 gene mutation, [id number] Family hx of breast and ovarian cancer No sure about any contraception at this time H/o two miscarriages prior to normal pregnancy this year, currently nursing  P:   Mammogram and MRI is due but pt is breast feeding.  Typical recommendation is to wait until after no longer nursing.  Will await Dr. Virgie Dad recommendations.   pap smear and HR HPV obtained today Pt will return in December for PUS.  Order placed.  She will be called.  Will plan ca-125 that day as well.   Continue PNV. Return annually or prn

## 2018-09-23 ENCOUNTER — Encounter: Payer: Self-pay | Admitting: Obstetrics & Gynecology

## 2018-09-24 LAB — CYTOLOGY - PAP
DIAGNOSIS: NEGATIVE
HPV (WINDOPATH): NOT DETECTED

## 2018-10-17 ENCOUNTER — Ambulatory Visit (INDEPENDENT_AMBULATORY_CARE_PROVIDER_SITE_OTHER): Payer: BC Managed Care – PPO | Admitting: Obstetrics & Gynecology

## 2018-10-17 ENCOUNTER — Ambulatory Visit (INDEPENDENT_AMBULATORY_CARE_PROVIDER_SITE_OTHER): Payer: BC Managed Care – PPO

## 2018-10-17 VITALS — BP 110/78 | HR 72 | Resp 16 | Ht 68.0 in | Wt 146.0 lb

## 2018-10-17 DIAGNOSIS — Z1502 Genetic susceptibility to malignant neoplasm of ovary: Secondary | ICD-10-CM | POA: Diagnosis not present

## 2018-10-17 DIAGNOSIS — Z1509 Genetic susceptibility to other malignant neoplasm: Secondary | ICD-10-CM

## 2018-10-17 DIAGNOSIS — Z1501 Genetic susceptibility to malignant neoplasm of breast: Secondary | ICD-10-CM

## 2018-10-17 NOTE — Progress Notes (Signed)
32 y.o. G3P1021 Married White or Caucasian female here for pelvic ultrasound due to h/o BRCA 2 gene positive testing.  Pt has Ca-125 on 10/17/18 of 13.5.  Pt has already been advised of this result.  She is not cycling at this point due to breast feeding.  No LMP recorded.  Contraception: none  Findings:  UTERUS: 6.7 x 5.2 x 3.3 cm EMS: 0.9 cm ADNEXA: Left ovary: 3.0 x 1.3 x 1.5 cm with 7 x 7 mm follicle noted       Right ovary: 2.0 x 1.5 x 1.6cm CUL DE SAC: No free fluid  Discussion: Findings reviewed with patient.  questions answered.    Assessment:  H/O BRCA 2 gene mutation, [id number]  Plan:  Will see Dr. Magrinat after the holidays and will discuss breast imaging, specifically abbreviated breast MRI with him Repeat PUS and ca-125 in one year.        

## 2018-10-18 LAB — CA 125: CANCER ANTIGEN (CA) 125: 13.5 U/mL (ref 0.0–38.1)

## 2018-10-20 ENCOUNTER — Encounter: Payer: Self-pay | Admitting: Obstetrics & Gynecology

## 2018-10-29 ENCOUNTER — Ambulatory Visit: Payer: BC Managed Care – PPO | Admitting: Oncology

## 2018-10-29 NOTE — Progress Notes (Signed)
[place]  [phone] [phone]    The following is a corrected copy of the original 10/30/2016 dictation   [name] DOB: 1986/01/08  MR#: [id number]  FKC#:[id number]  Patient Care Team: Patient, No Pcp Per as PCP - General ([place]) Wray Goehring, Virgie Dad, MD as Consulting Physician (Oncology) Megan Salon, MD as Consulting Physician (Gynecology) PCP: Patient, No Pcp Per SU:  OTHER MD:  CHIEF COMPLAINT: BRCA 2 positivity  CURRENT TREATMENT: Intensive monitoring   HISTORY OF PRESENT ILLNESS: From the original intake note:  Dominique Cannon has a significant family history suggesting a hereditary breast cancer syndrome and her gynecologist [name] working with Dr. [name] referred her for genetic evaluation. 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 The other 19 genes tested were unremarkable.  Her subsequent history is as detailed below  INTERVAL HISTORY: Jocabed returns today for follow-up of her BRCA positivity.   The patient continues under observation.   Since her last visit here, she gave birth to a boy, Dominique Cannon, on 07/24/2018. There were no complications.  Her last mammogram was completed on 09/29/2017 at [facility]. Orders have been placed for both an MRI and mammography, but have not been scheduled. Because she is breast feeding, her gynecologist/obstetrician said that she might want to wait until she stops breast-feeding to receive the MRI and mammography.    REVIEW OF SYSTEMS: Tai is doing well overall. She has not gone back to work yet since the birth, but she has been breast pumping to try to get the baby used to the bottle. She has not resumed yoga or zumba since before the birth, but she does try to walk regularly.  She has not had any periods since before the pregnancy. The patient denies unusual  headaches, visual changes, nausea, vomiting, or dizziness. There has been no unusual cough, phlegm production, or pleurisy. This been no change in bowel or bladder habits. The patient denies unexplained fatigue or unexplained weight loss, bleeding, rash, or fever. A detailed review of systems was otherwise noncontributory.    PAST MEDICAL HISTORY: Past Medical History:  Diagnosis Date  . BRCA2 positive   . Family history of breast cancer in mother    mother age 26 died of breast cancer  . Family history of ovarian cancer   . Supervision of other normal pregnancy, antepartum 12/26/2017    BABYSCRIPTS PATIENT: '[ ]'$  initial, '[ ]'$  12, '[ ]'$  20, '[ ]'$  28, '[ ]'$  32, '[ ]'$  36, '[ ]'$  38, '[ ]'$  39, '[ ]'$  40   Clinic  [facility] Prenatal Labs Dating LMP Blood type: A/Positive/-- (02/27 1600)  Genetic Screen Declined Antibody:Negative (02/27 1600) Anatomic [place]  normal needs fup end of june Rubella: 2.45 (02/27 1600) GTT Third trimester: neg RPR: Non Reactive (02/27 1600)  Flu vaccine  07/01/18 HBsAg: Negative (    PAST SURGICAL HISTORY: Past Surgical History:  Procedure Laterality Date  . DILATION AND EVACUATION N/A 01/16/2017   Procedure: DILATATION AND EVACUATION;  Surgeon: Megan Salon, MD;  Location: [facility];  Service: Gynecology;  Laterality: N/A;  . WISDOM TOOTH EXTRACTION      FAMILY HISTORY Family History  Problem Relation Age of Onset  . Breast cancer Mother 44  . Ovarian cancer Maternal Grandmother        dx in her 67s  . Breast cancer Paternal Grandmother   . Cancer Maternal Uncle  NOS  . Other Other        positive genetic testing for "the gene"  . Breast cancer Other        MGF's sister  . Breast cancer Other        MGF's mother   The patient's mother died at the age of 51 from breast cancer. The patient's mother had one sister, in good health. The patient's mother's mother has a history of ovarian cancer. One maternal great aunt also had a history of breast cancer. A second has tested positive  for a gene mutation, but we do not have those records. On the paternal side there is also a history of breast cancer, likely postmenopausal   GYNECOLOGIC HISTORY:  No LMP recorded. Menarche age 77, the patient is GX P1. She used a Mirena IUD for contraception.   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 Her husband, Dian Situ, is also a Art therapist, and band leader. Avagrace gave birth to her son, Dominique Cannon, on 07/24/2018.     ADVANCED DIRECTIVES: Not in place   HEALTH MAINTENANCE: Social History   Tobacco Use  . Smoking status: Never Smoker  . Smokeless tobacco: Never Used  Substance Use Topics  . Alcohol use: Not Currently  . Drug use: No     Colonoscopy:  PAP:  Bone density:  Lipid panel:  No Known Allergies  Current Outpatient Medications  Medication Sig Dispense Refill  . Misc. Devices (BREAST PUMP) MISC 1 Units by Does not apply route as needed. 1 each 0  . Prenatal Vit-Fe Fumarate-FA (PRENATAL VITAMIN PO) Take 1 tablet by mouth daily.      No current facility-administered medications for this visit.     OBJECTIVE: Young white woman in no acute distress Vitals:   10/30/18 1514  BP: 116/65  Pulse: 83  Resp: 20  Temp: 97.8 F (36.6 C)  SpO2: 100%     Body mass index is 22.32 kg/m.    ECOG FS:0 - Asymptomatic  Sclerae unicteric, EOMs intact Oropharynx clear and moist No cervical or supraclavicular adenopathy Lungs no rales or rhonchi Heart regular rate and rhythm Abd soft, nontender, positive bowel sounds MSK no focal spinal tenderness, no upper extremity lymphedema Neuro: nonfocal, well oriented, appropriate affect Breasts: No masses in either breast.  Small leakage on the right.  Both axillae are benign.    LAB RESULTS:  CMP  No results found for: NA, K, CL, CO2, GLUCOSE, BUN, CREATININE, CALCIUM, PROT, ALBUMIN, AST, ALT, ALKPHOS, BILITOT, GFRNONAA, GFRAA  INo  results found for: SPEP, UPEP  Lab Results  Component Value Date   WBC 17.0 (H) 07/25/2018   NEUTROABS 7.5 (H) 01/09/2018   HGB 9.5 (L) 07/25/2018   HCT 28.7 (L) 07/25/2018   MCV 82.7 07/25/2018   PLT 271 07/25/2018      Chemistry   No results found for: NA, K, CL, CO2, BUN, CREATININE, GLU No results found for: CALCIUM, ALKPHOS, AST, ALT, BILITOT     No results found for: LABCA2  No components found for: CBSWH675  No results for input(s): INR in the last 168 hours.  Urinalysis No results found for: COLORURINE, APPEARANCEUR, LABSPEC, PHURINE, GLUCOSEU, HGBUR, BILIRUBINUR, KETONESUR, PROTEINUR, UROBILINOGEN, NITRITE, LEUKOCYTESUR  STUDIES: US Pelvis Transvanginal Non-ob (tv Only)  Result Date: 10/17/2018 SEE PROGRESS NOTE   ASSESSMENT: 32 y.o. BRCA 2 positive [place], [place] woman with brest density category D   (1) patient has  decided against bilateral mastectomies and BSO, at least until child-bearing and nursing are complete  (2) patient is not planning to start tamoxifen at this point  (3) patient is considering OCPs for birth control and ovarian cancer risk-reduction  (4) breast cancer screening: yearly mammogram and MRI, preferably 6 months apart; biannual exams  (5) ovarian cancer screening: pelvic US every 6 months with CA 125  PLAN: Neria is breast-feeding and I do not think we are going to get much information from either the MRI or mammography while this is going on.  On the other hand she is decreasing her breast cancer risk somewhat by breast-feeding and that is favorable.  Her plan is to breast-feed at least for a year.  Accordingly I have set her to have a breast MRI in October 2020 and see me shortly after that visit  She will let me know if she notices any change in either breast other than the changes expected at this point  She knows to call for any other issue that may develop before her next visit.   Tobyn Osgood, Virgie Dad, MD  10/30/18 3:46  PM Medical Oncology and Hematology [facility] [address] [place], [place] [address] Tel. [phone]    Fax. [phone]   I, Jacqualyn Posey am acting as a Education administrator for Chauncey Cruel, MD.   I, Lurline Del MD, have reviewed the above documentation for accuracy and completeness, and I agree with the above.

## 2018-10-30 ENCOUNTER — Inpatient Hospital Stay: Payer: BC Managed Care – PPO | Attending: Oncology | Admitting: Oncology

## 2018-10-30 VITALS — BP 116/65 | HR 83 | Temp 97.8°F | Resp 20 | Ht 68.0 in | Wt 146.8 lb

## 2018-10-30 DIAGNOSIS — Z1231 Encounter for screening mammogram for malignant neoplasm of breast: Secondary | ICD-10-CM

## 2018-10-30 DIAGNOSIS — Z803 Family history of malignant neoplasm of breast: Secondary | ICD-10-CM

## 2018-10-30 DIAGNOSIS — Z8041 Family history of malignant neoplasm of ovary: Secondary | ICD-10-CM | POA: Diagnosis not present

## 2018-10-30 DIAGNOSIS — Z79899 Other long term (current) drug therapy: Secondary | ICD-10-CM | POA: Diagnosis not present

## 2018-10-30 DIAGNOSIS — Z1509 Genetic susceptibility to other malignant neoplasm: Secondary | ICD-10-CM | POA: Diagnosis not present

## 2018-10-30 DIAGNOSIS — Z1501 Genetic susceptibility to malignant neoplasm of breast: Secondary | ICD-10-CM | POA: Diagnosis not present

## 2018-10-30 DIAGNOSIS — N96 Recurrent pregnancy loss: Secondary | ICD-10-CM

## 2018-11-04 ENCOUNTER — Other Ambulatory Visit: Payer: Self-pay | Admitting: *Deleted

## 2018-11-04 DIAGNOSIS — Z1231 Encounter for screening mammogram for malignant neoplasm of breast: Secondary | ICD-10-CM

## 2019-06-20 ENCOUNTER — Telehealth: Payer: Self-pay | Admitting: Obstetrics & Gynecology

## 2019-06-20 NOTE — Telephone Encounter (Signed)
Opened encounter in error  

## 2019-06-24 ENCOUNTER — Encounter: Payer: Self-pay | Admitting: Radiology

## 2019-07-23 ENCOUNTER — Other Ambulatory Visit: Payer: Self-pay | Admitting: Obstetrics & Gynecology

## 2019-07-23 DIAGNOSIS — Z1501 Genetic susceptibility to malignant neoplasm of breast: Secondary | ICD-10-CM

## 2019-07-23 DIAGNOSIS — Z1509 Genetic susceptibility to other malignant neoplasm: Secondary | ICD-10-CM

## 2019-07-24 ENCOUNTER — Telehealth: Payer: Self-pay | Admitting: *Deleted

## 2019-07-24 NOTE — Telephone Encounter (Signed)
Spoke with patient. Patient states she had a positive UPT the end of August and is scheduled for OB appt on 9/22. Her first child will be one tomorrow. Advised patient I will update Dr. Sabra Heck and return call if any additional recommendations. Patient agreeable and thankful for call.   Routing to Dr. Sabra Heck  Cc: Lerry Liner

## 2019-07-24 NOTE — Telephone Encounter (Signed)
-----  Message from Megan Salon, MD sent at 07/23/2019  1:44 PM EDT ----- Regarding: ultrasdound and ca 125 Dominique Cannon, and Dominique Cannon This pt has BRCA gene mutation and needs every six month ultrasound and ca-125.  Would you call and see if we can get scheduled.  Was due in June.    I've placed the orders.  Thanks. Vinnie Level

## 2019-07-24 NOTE — Telephone Encounter (Signed)
Left message to call Cadynce Garrette, RN at [facility] [phone].   

## 2019-08-04 ENCOUNTER — Encounter: Payer: Self-pay | Admitting: Emergency Medicine

## 2019-08-04 ENCOUNTER — Emergency Department: Payer: BC Managed Care – PPO

## 2019-08-04 ENCOUNTER — Emergency Department
Admission: EM | Admit: 2019-08-04 | Discharge: 2019-08-04 | Disposition: A | Discharge status: Home / Self Care | Payer: BC Managed Care – PPO | Attending: Emergency Medicine | Admitting: Emergency Medicine

## 2019-08-04 ENCOUNTER — Other Ambulatory Visit: Payer: Self-pay

## 2019-08-04 ENCOUNTER — Telehealth: Payer: Self-pay

## 2019-08-04 DIAGNOSIS — O468X1 Other antepartum hemorrhage, first trimester: Secondary | ICD-10-CM | POA: Insufficient documentation

## 2019-08-04 DIAGNOSIS — Z3A10 10 weeks gestation of pregnancy: Secondary | ICD-10-CM | POA: Diagnosis not present

## 2019-08-04 DIAGNOSIS — O418X11 Other specified disorders of amniotic fluid and membranes, first trimester, fetus 1: Secondary | ICD-10-CM | POA: Insufficient documentation

## 2019-08-04 DIAGNOSIS — O418X10 Other specified disorders of amniotic fluid and membranes, first trimester, not applicable or unspecified: Secondary | ICD-10-CM

## 2019-08-04 DIAGNOSIS — IMO0001 Reserved for inherently not codable concepts without codable children: Secondary | ICD-10-CM

## 2019-08-04 LAB — CBC
HCT: 36.6 % (ref 36.0–46.0)
Hemoglobin: 12.3 g/dL (ref 12.0–15.0)
MCH: 29.5 pg (ref 26.0–34.0)
MCHC: 33.6 g/dL (ref 30.0–36.0)
MCV: 87.8 fL (ref 80.0–100.0)
Platelets: 309 10*3/uL (ref 150–400)
RBC: 4.17 MIL/uL (ref 3.87–5.11)
RDW: 12.1 % (ref 11.5–15.5)
WBC: 9.5 10*3/uL (ref 4.0–10.5)
nRBC: 0 % (ref 0.0–0.2)

## 2019-08-04 LAB — LIPASE, BLOOD: Lipase: 31 U/L (ref 11–51)

## 2019-08-04 LAB — COMPREHENSIVE METABOLIC PANEL
ALT: 12 U/L (ref 0–44)
AST: 16 U/L (ref 15–41)
Albumin: 4.1 g/dL (ref 3.5–5.0)
Alkaline Phosphatase: 45 U/L (ref 38–126)
Anion gap: 8 (ref 5–15)
BUN: 9 mg/dL (ref 6–20)
CO2: 24 mmol/L (ref 22–32)
Calcium: 9.4 mg/dL (ref 8.9–10.3)
Chloride: 104 mmol/L (ref 98–111)
Creatinine, Ser: 0.52 mg/dL (ref 0.44–1.00)
GFR calc Af Amer: >60 mL/min (ref >60)
GFR calc non Af Amer: >60 mL/min (ref >60)
Glucose, Bld: 105 mg/dL — ABNORMAL HIGH (ref 70–99)
Potassium: 3.8 mmol/L (ref 3.5–5.1)
Sodium: 136 mmol/L (ref 135–145)
Total Bilirubin: 0.5 mg/dL (ref 0.3–1.2)
Total Protein: 7 g/dL (ref 6.5–8.1)

## 2019-08-04 LAB — HCG, QUANTITATIVE, PREGNANCY: hCG, Beta Chain, Quant, S: 45829 m[IU]/mL — ABNORMAL HIGH (ref <5)

## 2019-08-04 LAB — POCT PREGNANCY, URINE: Preg Test, Ur: POSITIVE — AB

## 2019-08-04 NOTE — Discharge Instructions (Addendum)
Please follow-up with your OB/GYN as scheduled.  Please bring them a copy of the ultrasound report that has been provided to you.  Return to the emergency department for any significant lower abdominal pain or significant bleeding.

## 2019-08-04 NOTE — ED Notes (Signed)
Patient transported to Ultrasound 

## 2019-08-04 NOTE — ED Triage Notes (Signed)
Pt reports had a positive home pregnancy test at home a month ago. Pt reports at about 13:30 today she started with abd cramping and vaginal bleeding. Pt reports she had a gush of bright red blood right when she began to cramp. Pt denies clots. Pt has first appointment with OB tomorrow and she called them and they told her to come here. Pt with hx of miscarriages.

## 2019-08-04 NOTE — ED Provider Notes (Signed)
[facility] Emergency Department Provider Note  Time seen: 4:38 PM  I have reviewed the triage vital signs and the nursing notes.   HISTORY  Chief Complaint Possible Pregnancy, Vaginal Bleeding, and Abdominal Pain   HPI Dominique Cannon is a 33 y.o. female G4, P1 A2 presents to the emergency department for vaginal spotting approximately 10 to [redacted] weeks pregnant.  According to the patient she had a baby approximately 1 year ago, is currently breast-feeding and her menstrual cycles been very irregular.  Patient did note a recent positive pregnancy test however since last night she has been experiencing slight vaginal bleeding/spotting.  Patient called her OB and they recommended she come to the emergency department for evaluation.  Patient had a scheduled ultrasound for tomorrow but has not yet had an ultrasound during this pregnancy.  Patient states slight abdominal cramping last night but denies any abdominal cramping or pain today.  No fever cough or shortness of breath.   Past Medical History:  Diagnosis Date  . BRCA2 positive   . Family history of breast cancer in mother    mother age 95 died of breast cancer  . Family history of ovarian cancer   . Supervision of other normal pregnancy, antepartum 12/26/2017    BABYSCRIPTS PATIENT: '[ ]'$  initial, '[ ]'$  12, '[ ]'$  20, '[ ]'$  28, '[ ]'$  32, '[ ]'$  36, '[ ]'$  38, '[ ]'$  39, '[ ]'$  40   Clinic  [facility] Prenatal Labs Dating LMP Blood type: A/Positive/-- (02/27 1600)  Genetic Screen Declined Antibody:Negative (02/27 1600) Anatomic [place]  normal needs fup end of june Rubella: 2.45 (02/27 1600) GTT Third trimester: neg RPR: Non Reactive (02/27 1600)  Flu vaccine  07/01/18 HBsAg: Negative (    Patient Active Problem List   Diagnosis Date Noted  . 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 Genetic testing 09/27/2015  . 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 Family history of breast cancer   . Family history of ovarian cancer     Past Surgical History:   Procedure Laterality Date  . DILATION AND EVACUATION N/A 01/16/2017   Procedure: DILATATION AND EVACUATION;  Surgeon: Megan Salon, MD;  Location: [facility];  Service: Gynecology;  Laterality: N/A;  . WISDOM TOOTH EXTRACTION      Prior to Admission medications   Medication Sig Start Date End Date Taking? Authorizing Provider  Misc. Devices (BREAST PUMP) MISC 1 Units by Does not apply route as needed. 07/01/18   Donnamae Jude, MD  Prenatal Vit-Fe Fumarate-FA (PRENATAL VITAMIN PO) Take 1 tablet by mouth daily.     [provider]    No Known Allergies  Family History  Problem Relation Age of Onset  . Breast cancer Mother 33  . Ovarian cancer Maternal Grandmother        dx in her 14s  . Breast cancer Paternal Grandmother   . Cancer Maternal Uncle        NOS  . Other Other        positive genetic testing for "the gene"  . Breast cancer Other        MGF's sister  . Breast cancer Other        MGF's mother    Social History Social History   Tobacco Use  . Smoking status: Never Smoker  . Smokeless tobacco: Never Used  Substance Use Topics  . Alcohol use: Not Currently  . Drug use: No    Review of Systems Constitutional: Negative for  fever. Cardiovascular: Negative for chest pain. Respiratory: Negative for shortness of breath. Gastrointestinal: Lower abdominal cramping last night, none today. Genitourinary: Vaginal spotting since last night. Musculoskeletal: Negative for musculoskeletal complaints Neurological: Negative for headache All other ROS negative  ____________________________________________   PHYSICAL EXAM:  VITAL SIGNS: ED Triage Vitals  Enc Vitals Group     BP 08/04/19 1541 116/82     Pulse Rate 08/04/19 1541 91     Resp 08/04/19 1541 16     Temp 08/04/19 1541 98 F (36.7 C)     Temp Source 08/04/19 1541 Oral     SpO2 08/04/19 1541 100 %     Weight 08/04/19 1441 135 lb (61.2 kg)     Height 08/04/19 1441 '5\' 8"'$  (1.727 m)     Head  Circumference --      Peak Flow --      Pain Score 08/04/19 1441 0     Pain Loc --      Pain Edu? --      Excl. in [employer]? --    Constitutional: Alert and oriented. Well appearing and in no distress. Eyes: Normal exam ENT      Head: Normocephalic and atraumatic.      Mouth/Throat: Mucous membranes are moist. Cardiovascular: Normal rate, regular rhythm. No murmur Respiratory: Normal respiratory effort without tachypnea nor retractions. Breath sounds are clear  Gastrointestinal: Soft and nontender. No distention. Musculoskeletal: Nontender with normal range of motion in all extremities.  Neurologic:  Normal speech and language. No gross focal neurologic deficits  Skin:  Skin is warm, dry and intact.  Psychiatric: Mood and affect are normal.   ____________________________________________   RADIOLOGY  Ultrasound shows a single live intrauterine pregnancy at 12 weeks and 1 day with a normal heart rate of 171.  There is a large subchorionic hemorrhage.  ____________________________________________   INITIAL IMPRESSION / ASSESSMENT AND PLAN / ED COURSE  Pertinent labs & imaging results that were available during my care of the patient were reviewed by me and considered in my medical decision making (see chart for details).   Patient presents to the emergency department for vaginal spotting approximately 10 to [redacted] weeks pregnant.  Differential would include threatened miscarriage, ectopic pregnancy, subchorionic hemorrhage.  Patient's labs are largely reassuring urine pregnancy test is positive we will send confirmatory beta-hCG.  We will perform an ultrasound to further evaluate.  Patient agreeable to plan of care.  Ultrasound shows live intrauterine pregnancy with large subchorionic hemorrhage.  Patient has OB follow-up scheduled tomorrow.  I will print the ultrasound report for the patient to bring to her OB/GYN.  Patient agreeable to plan of care.   Dominique Cannon was evaluated in  Emergency Department on 08/04/2019 for the symptoms described in the history of present illness. She was evaluated in the context of the global COVID-19 pandemic, which necessitated consideration that the patient might be at risk for infection with the SARS-CoV-2 virus that causes COVID-19. Institutional protocols and algorithms that pertain to the evaluation of patients at risk for COVID-19 are in a state of rapid change based on information released by regulatory bodies including the CDC and federal and state organizations. These policies and algorithms were followed during the patient's care in the ED.  ____________________________________________   FINAL CLINICAL IMPRESSION(S) / ED DIAGNOSES  Threatened miscarriage Subchorionic hemorrhage   Harvest Dark, MD 08/04/19 1746

## 2019-08-04 NOTE — ED Notes (Signed)
AAOx3.  Skin warm and dry.  NAD 

## 2019-08-04 NOTE — Telephone Encounter (Signed)
Received call from patient stating she is having some bleeding and cramping. She is early pregnant and is concern at this time. Patient reports having to wear a pad due to the bleeding. I have advised patient to go to the ER to be seen since she is early pregnant with bleeding and cramping. Patient voice understanding at this time.

## 2019-08-05 ENCOUNTER — Other Ambulatory Visit: Payer: Self-pay | Admitting: Obstetrics & Gynecology

## 2019-08-05 ENCOUNTER — Encounter: Payer: Self-pay | Admitting: Obstetrics & Gynecology

## 2019-08-05 ENCOUNTER — Ambulatory Visit (INDEPENDENT_AMBULATORY_CARE_PROVIDER_SITE_OTHER): Payer: BC Managed Care – PPO | Admitting: Obstetrics & Gynecology

## 2019-08-05 VITALS — BP 105/70 | HR 94 | Wt 130.2 lb

## 2019-08-05 DIAGNOSIS — O468X1 Other antepartum hemorrhage, first trimester: Secondary | ICD-10-CM

## 2019-08-05 DIAGNOSIS — Z23 Encounter for immunization: Secondary | ICD-10-CM

## 2019-08-05 DIAGNOSIS — Z3689 Encounter for other specified antenatal screening: Secondary | ICD-10-CM

## 2019-08-05 DIAGNOSIS — Z113 Encounter for screening for infections with a predominantly sexual mode of transmission: Secondary | ICD-10-CM | POA: Diagnosis not present

## 2019-08-05 DIAGNOSIS — Z3480 Encounter for supervision of other normal pregnancy, unspecified trimester: Secondary | ICD-10-CM

## 2019-08-05 DIAGNOSIS — Z3A12 12 weeks gestation of pregnancy: Secondary | ICD-10-CM

## 2019-08-05 DIAGNOSIS — O418X10 Other specified disorders of amniotic fluid and membranes, first trimester, not applicable or unspecified: Secondary | ICD-10-CM

## 2019-08-05 NOTE — Patient Instructions (Signed)
Subchorionic Hematoma ° °A subchorionic hematoma is a gathering of blood between the outer wall of the embryo (chorion) and the inner wall of the womb (uterus). °This condition can cause vaginal bleeding. If they cause little or no vaginal bleeding, early small hematomas usually shrink on their own and do not affect your baby or pregnancy. When bleeding starts later in pregnancy, or if the hematoma is larger or occurs in older pregnant women, the condition may be more serious. Larger hematomas may get bigger, which increases the chances of miscarriage. This condition also increases the risk of: °· Premature separation of the placenta from the uterus. °· Premature (preterm) labor. °· Stillbirth. °What are the causes? °The exact cause of this condition is not known. It occurs when blood is trapped between the placenta and the uterine wall because the placenta has separated from the original site of implantation. °What increases the risk? °You are more likely to develop this condition if: °· You were treated with fertility medicines. °· You conceived through in vitro fertilization (IVF). °What are the signs or symptoms? °Symptoms of this condition include: °· Vaginal spotting or bleeding. °· Contractions of the uterus. These cause abdominal pain. °Sometimes you may have no symptoms and the bleeding may only be seen when ultrasound images are taken (transvaginal ultrasound). °How is this diagnosed? °This condition is diagnosed based on a physical exam. This includes a pelvic exam. You may also have other tests, including: °· Blood tests. °· Urine tests. °· Ultrasound of the abdomen. °How is this treated? °Treatment for this condition can vary. Treatment may include: °· Watchful waiting. You will be monitored closely for any changes in bleeding. During this stage: °? The hematoma may be reabsorbed by the body. °? The hematoma may separate the fluid-filled space containing the embryo (gestational sac) from the wall of the  womb (endometrium). °· Medicines. °· Activity restriction. This may be needed until the bleeding stops. °Follow these instructions at home: °· Stay on bed rest if told to do so by your health care provider. °· Do not lift anything that is heavier than 10 lbs. (4.5 kg) or as told by your health care provider. °· Do not use any products that contain nicotine or tobacco, such as cigarettes and e-cigarettes. If you need help quitting, ask your health care provider. °· Track and write down the number of pads you use each day and how soaked (saturated) they are. °· Do not use tampons. °· Keep all follow-up visits as told by your health care provider. This is important. Your health care provider may ask you to have follow-up blood tests or ultrasound tests or both. °Contact a health care provider if: °· You have any vaginal bleeding. °· You have a fever. °Get help right away if: °· You have severe cramps in your stomach, back, abdomen, or pelvis. °· You pass large clots or tissue. Save any tissue for your health care provider to look at. °· You have more vaginal bleeding, and you faint or become lightheaded or weak. °Summary °· A subchorionic hematoma is a gathering of blood between the outer wall of the placenta and the uterus. °· This condition can cause vaginal bleeding. °· Sometimes you may have no symptoms and the bleeding may only be seen when ultrasound images are taken. °· Treatment may include watchful waiting, medicines, or activity restriction. °This information is not intended to replace advice given to you by your health care provider. Make sure you discuss any questions you   have with your health care provider. °Document Released: 02/14/2007 Document Revised: 10/12/2017 Document Reviewed: 12/26/2016 °Elsevier Patient Education © 2020 [employer] ° °

## 2019-08-05 NOTE — Progress Notes (Signed)
History:   Dominique Cannon is a 33 y.o. [phone] at 60w2dby LMP, early ultrasound being seen today for her first obstetrical visit.  Her obstetrical history is significant for having a term SVD last pregnancy, receurrent SAB prior to that with negative evaluation. Pregnancy history fully reviewed.  [facility] was bleeding significantly [facility] Ultrasound showed large subchorionic hematoma but viable intrauterine pregnancy.  No bleeding reported today but she is nervous about the threatened miscarriage.       HISTORY: OB History  Gravida Para Term Preterm AB Living  '4 1 1 '$ 0 2 1  SAB TAB Ectopic Multiple Live Births  2 0 0 0 1    # Outcome Date GA Lbr Len/2nd Weight Sex Delivery Anes PTL Lv  4 Current           3 Term 07/24/18 433w0d1:30 / 04:19 8 lb 1.3 oz (3.665 kg) M Vag-Spont Local  LIV     Name: Stenseth,BOY Sterling     Apgar1: 8  Apgar5: 9  2 SAB 08/13/17     SAB     1 SAB 01/16/17     SAB       Last pap smear was done 09/20/2018 and was normal  Past Medical History:  Diagnosis Date  . BRCA2 positive   . Family history of breast cancer in mother    mother age 1882ied of breast cancer  . Family history of ovarian cancer    Past Surgical History:  Procedure Laterality Date  . DILATION AND EVACUATION N/A 01/16/2017   Procedure: DILATATION AND EVACUATION;  Surgeon: MaMegan SalonMD;  [facility];  Service: Gynecology;  Laterality: N/A;  . WISDOM TOOTH EXTRACTION     Family History  Problem Relation Age of Onset  . Breast cancer Mother 3048. Ovarian cancer Maternal Grandmother        dx in her 6055s. Breast cancer Paternal Grandmother   . Cancer Maternal Uncle        NOS  . Other Other        positive genetic testing for "the gene"  . Breast cancer Other        MGF's sister  . Breast cancer Other        MGF's mother   Social History   Tobacco Use  . Smoking status: Never Smoker  . Smokeless tobacco: Never Used  Substance Use Topics  .  Alcohol use: Not Currently  . Drug use: No   No Known Allergies Current Outpatient Medications on File Prior to Visit  Medication Sig Dispense Refill  . Prenatal Vit-Fe Fumarate-FA (PRENATAL VITAMIN PO) Take 1 tablet by mouth daily.     . Misc. Devices (BREAST PUMP) MISC 1 Units by Does not apply route as needed. (Patient not taking: Reported on 08/05/2019) 1 each 0   No current facility-administered medications on file prior to visit.     Review of Systems Pertinent items noted in HPI and remainder of comprehensive ROS otherwise negative. Physical Exam:   Vitals:   08/05/19 1329  BP: 105/70  Pulse: 94  Weight: 130 lb 3.2 oz (59.1 kg)   Fetal Heart Rate (bpm): 158 General: well-developed, well-nourished female in no acute distress  Breasts:  normal appearance, no masses or tenderness bilaterally  Skin: normal coloration and turgor, no rashes  Neurologic: oriented, normal, negative, normal mood  Extremities: normal strength, tone, and muscle mass, ROM of all joints  is normal  HEENT PERRLA, extraocular movement intact and sclera clear, anicteric  Mouth/Teeth mucous membranes moist, pharynx normal without lesions and dental hygiene good  Neck supple and no masses  Cardiovascular: regular rate and rhythm  Respiratory:  no respiratory distress, normal breath sounds  Abdomen: soft, non-tender; bowel sounds normal; no masses,  no organomegaly  Pelvic: deferred   Imaging: US Ob Comp Less 14 Wks  Result Date: 08/04/2019 CLINICAL DATA:  Approximately 10 week pregnancy, bleeding for 1 day EXAM: OBSTETRIC <14 WK ULTRASOUND TECHNIQUE: Transabdominal ultrasound was performed for evaluation of the gestation as well as the maternal uterus and adnexal regions. COMPARISON:  None for this gestation FINDINGS: Intrauterine gestational sac: Present, single Yolk sac:  Present Embryo:  Present Cardiac Activity: Present Heart Rate: 171 bpm CRL:   55.9 mm   12 w 1 d                  [place] EDC: 02/15/2020  Subchorionic hemorrhage:  Large subchronic hemorrhage Maternal uterus/adnexae: Developing posterior placenta extending LEFT. Mild thinning of the myometrium especially anteriorly, recommend attention on follow-up imaging. RIGHT ovary normal size and morphology, 3.2 x 1.5 x 2.6 cm. LEFT ovary normal size and morphology 2.8 x 1.3 x 1.4 cm. No free pelvic fluid or adnexal masses. IMPRESSION: Single live intrauterine gestation at 12 weeks 1 day EGA by crown-rump length. Large subchronic hemorrhage. Slight thinning of the myometrium, recommend attention on follow-up imaging. Electronically Signed   By: Lavonia Dana M.D.   On: 08/04/2019 17:21     Assessment:    Pregnancy: [id number] Patient Active Problem List   Diagnosis Date Noted  . 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 Supervision of other normal pregnancy, antepartum 12/26/2017  . Genetic testing 09/27/2015  . 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 Family history of breast cancer   . Family history of ovarian cancer      Plan:    1. Supervision of other normal pregnancy, antepartum 2. Subchorionic hematoma in first trimester, single or unspecified fetus 3. Encounter for fetal anatomic survey - Obstetric Panel, Including HIV - Culture, OB Urine - GC/Chlamydia probe amp ([place])not at [facility] - Flu Vaccine QUAD 36+ mos IM (Fluarix, Quad PF) - Babyscripts Schedule Optimization - [place] MFM OB DETAIL +14 WK; Future Initial labs drawn and Flu vaccine given. Continue prenatal vitamins. Genetic Screening discussed, First trimester screen, Quad screen and NIPS: declined. Ultrasound discussed; fetal anatomic survey: ordered. Discussed implications of large subchorionic hematoma, bleeding precautions reviewed. Recommended pelvic rest for next few weeks (told that the benefit of this has not been proven in literature but it cannot hurt). Understands that there are no interventions for this condition, but reassurance about fetal viability can be given  to her as needed.  Will do follow up scans as needed.  Problem list reviewed and updated. The nature of [place] with multiple MDs and other Advanced Practice Providers was explained to patient; also emphasized that residents, students are part of our team. Routine obstetric precautions reviewed. Return in about 2 weeks (around 08/19/2019) for RN visit for FHR check  4 weeks: Virtual OB Visit.     Verita Schneiders, MD, [place] [facility] [employer], [place]

## 2019-08-06 ENCOUNTER — Other Ambulatory Visit: Payer: Self-pay | Admitting: *Deleted

## 2019-08-06 DIAGNOSIS — Z3480 Encounter for supervision of other normal pregnancy, unspecified trimester: Secondary | ICD-10-CM

## 2019-08-06 LAB — OBSTETRIC PANEL, INCLUDING HIV
Antibody Screen: NEGATIVE
Basophils Absolute: 0 10*3/uL (ref 0.0–0.2)
Basos: 0 %
EOS (ABSOLUTE): 0 10*3/uL (ref 0.0–0.4)
Eos: 0 %
HIV Screen 4th Generation wRfx: NONREACTIVE
Hematocrit: 36.8 % (ref 34.0–46.6)
Hemoglobin: 12.3 g/dL (ref 11.1–15.9)
Hepatitis B Surface Ag: NEGATIVE
Immature Grans (Abs): 0 10*3/uL (ref 0.0–0.1)
Immature Granulocytes: 0 %
Lymphocytes Absolute: 1.6 10*3/uL (ref 0.7–3.1)
Lymphs: 20 %
MCH: 29.8 pg (ref 26.6–33.0)
MCHC: 33.4 g/dL (ref 31.5–35.7)
MCV: 89 fL (ref 79–97)
Monocytes Absolute: 0.5 10*3/uL (ref 0.1–0.9)
Monocytes: 6 %
Neutrophils Absolute: 5.9 10*3/uL (ref 1.4–7.0)
Neutrophils: 74 %
Platelets: 324 10*3/uL (ref 150–450)
RBC: 4.13 x10E6/uL (ref 3.77–5.28)
RDW: 12.4 % (ref 11.7–15.4)
RPR Ser Ql: NONREACTIVE
Rh Factor: POSITIVE
Rubella Antibodies, IGG: 3.6 index (ref >0.99)
WBC: 8 10*3/uL (ref 3.4–10.8)

## 2019-08-06 LAB — URINE CYTOLOGY ANCILLARY ONLY
Chlamydia: NEGATIVE
Neisseria Gonorrhea: NEGATIVE

## 2019-08-07 LAB — CULTURE, OB URINE

## 2019-08-07 LAB — URINE CULTURE, OB REFLEX

## 2019-08-19 ENCOUNTER — Encounter: Payer: Self-pay | Admitting: Radiology

## 2019-08-19 ENCOUNTER — Other Ambulatory Visit: Payer: Self-pay

## 2019-08-19 ENCOUNTER — Ambulatory Visit (INDEPENDENT_AMBULATORY_CARE_PROVIDER_SITE_OTHER): Payer: BC Managed Care – PPO | Admitting: *Deleted

## 2019-08-19 VITALS — BP 117/69 | HR 105

## 2019-08-19 DIAGNOSIS — Z3480 Encounter for supervision of other normal pregnancy, unspecified trimester: Secondary | ICD-10-CM

## 2019-08-19 NOTE — Progress Notes (Signed)
Patient seen and assessed by nursing staff.  Agree with documentation and plan.  

## 2019-08-19 NOTE — Progress Notes (Signed)
Pt here today for a FHR check. Pt denies any issues at this time.   FHR 155 via doppler Will follow up with next [place] visit.   Crosby Oyster, RN

## 2019-09-02 ENCOUNTER — Telehealth (INDEPENDENT_AMBULATORY_CARE_PROVIDER_SITE_OTHER): Payer: BC Managed Care – PPO | Admitting: Family Medicine

## 2019-09-02 ENCOUNTER — Other Ambulatory Visit: Payer: Self-pay

## 2019-09-02 VITALS — BP 91/60

## 2019-09-02 DIAGNOSIS — Z3480 Encounter for supervision of other normal pregnancy, unspecified trimester: Secondary | ICD-10-CM

## 2019-09-02 DIAGNOSIS — Z3A16 16 weeks gestation of pregnancy: Secondary | ICD-10-CM

## 2019-09-02 DIAGNOSIS — O4692 Antepartum hemorrhage, unspecified, second trimester: Secondary | ICD-10-CM

## 2019-09-02 NOTE — Progress Notes (Signed)
 TELEHEALTH OBSTETRICS PRENATAL VIRTUAL VIDEO VISIT ENCOUNTER NOTE  Provider location: [facility] [place]   I connected with Dominique Cannon on 09/02/19 at  1:15 PM EDT by MyChart Video Encounter at home and verified that I am speaking with the correct person using two identifiers.   I discussed the limitations, risks, security and privacy concerns of performing an evaluation and management service virtually and the availability of in person appointments. I also discussed with the patient that there may be a patient responsible charge related to this service. The patient expressed understanding and agreed to proceed. Subjective:  Dominique Cannon is a 33 y.o. G4P1021 at [redacted]w[redacted]d being seen today for ongoing prenatal care.  She is currently monitored for the following issues for this high-risk pregnancy and has Family history of breast cancer; Family history of ovarian cancer; Genetic testing; BRCA2 positive; History of recurrent miscarriages; Supervision of other normal pregnancy, antepartum; and Vaginal bleeding in pregnancy, second trimester on their problem list.  Patient reports bleeding which continues. 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   . Vag. Bleeding: Small.   . Denies any leaking of fluid.   The following portions of the patient's history were reviewed and updated as appropriate: allergies, current medications, past family history, past medical history, past social history, past surgical history and problem list.   Objective:   Vitals:   09/02/19 1312  BP: 91/60    Fetal Status:           General:  Alert, oriented and cooperative. Patient is in no acute distress.  Respiratory: Normal respiratory effort, no problems with respiration noted  Mental Status: Normal mood and affect. Normal behavior. Normal judgment and thought content.  Rest of physical exam deferred due to type of encounter  Imaging: Us Ob Comp Less 14 Wks  Result Date: 08/04/2019  CLINICAL DATA:  Approximately 10 week pregnancy, bleeding for 1 day EXAM: OBSTETRIC <14 WK ULTRASOUND TECHNIQUE: Transabdominal ultrasound was performed for evaluation of the gestation as well as the maternal uterus and adnexal regions. COMPARISON:  None for this gestation FINDINGS: Intrauterine gestational sac: Present, single Yolk sac:  Present Embryo:  Present Cardiac Activity: Present Heart Rate: 171 bpm CRL:   55.9 mm   12 w 1 d                  US EDC: 02/15/2020 Subchorionic hemorrhage:  Large subchronic hemorrhage Maternal uterus/adnexae: Developing posterior placenta extending LEFT. Mild thinning of the myometrium especially anteriorly, recommend attention on follow-up imaging. RIGHT ovary normal size and morphology, 3.2 x 1.5 x 2.6 cm. LEFT ovary normal size and morphology 2.8 x 1.3 x 1.4 cm. No free pelvic fluid or adnexal masses. IMPRESSION: Single live intrauterine gestation at 12 weeks 1 day EGA by crown-rump length. Large subchronic hemorrhage. Slight thinning of the myometrium, recommend attention on follow-up imaging. Electronically Signed   By: Mark  Boles M.D.   On: 08/04/2019 17:21    Assessment and Plan:  Pregnancy: G4P1021 at [redacted]w[redacted]d 1. Supervision of other normal pregnancy, antepartum Needs to reduce stress Come by for FHR check. Anatomy u/s scheduled.  2. Vaginal bleeding in pregnancy, second trimester Likely old blood, no pain. Reassurance given  General obstetric precautions including but not limited to vaginal bleeding, contractions, leaking of fluid and fetal movement were reviewed in detail with the patient. I discussed the assessment and treatment plan with the patient. The patient was provided an opportunity to ask questions and   all were answered. The patient agreed with the plan and demonstrated an understanding of the instructions. The patient was advised to call back or seek an in-person office evaluation/[facility] for any urgent or  concerning symptoms. Please refer to After Visit Summary for other counseling recommendations.   I provided 15 minutes of face-to-face time during this encounter.  Return in 4 weeks (on 09/30/2019) for in person with AFP.  Future Appointments  Date Time Provider Department Center  09/03/2019  3:30 PM Magrinat, Gustav C, MD [facility] None  09/23/2019  1:00 PM [facility] NURSE [facility]  09/23/2019  1:00 PM [facility] US 3 [facility]-US  09/30/2019  2:45 PM Dove, Myra C, MD [facility]  01/16/2020  3:00 PM Miller, Mary S, MD [facility] None    Tanya S Pratt, MD [facility], [facility]  

## 2019-09-02 NOTE — Patient Instructions (Signed)

## 2019-09-03 ENCOUNTER — Inpatient Hospital Stay: Payer: BC Managed Care – PPO | Attending: Oncology | Admitting: Oncology

## 2019-09-03 ENCOUNTER — Other Ambulatory Visit: Payer: Self-pay

## 2019-09-03 VITALS — BP 114/72 | HR 78 | Temp 98.3°F | Resp 18 | Ht 68.0 in | Wt 132.2 lb

## 2019-09-03 DIAGNOSIS — Z3A10 10 weeks gestation of pregnancy: Secondary | ICD-10-CM | POA: Diagnosis not present

## 2019-09-03 DIAGNOSIS — Z1501 Genetic susceptibility to malignant neoplasm of breast: Secondary | ICD-10-CM | POA: Insufficient documentation

## 2019-09-03 DIAGNOSIS — Z803 Family history of malignant neoplasm of breast: Secondary | ICD-10-CM | POA: Diagnosis not present

## 2019-09-03 DIAGNOSIS — Z8041 Family history of malignant neoplasm of ovary: Secondary | ICD-10-CM | POA: Diagnosis not present

## 2019-09-03 DIAGNOSIS — Z79899 Other long term (current) drug therapy: Secondary | ICD-10-CM | POA: Diagnosis not present

## 2019-09-03 DIAGNOSIS — O4692 Antepartum hemorrhage, unspecified, second trimester: Secondary | ICD-10-CM | POA: Diagnosis not present

## 2019-09-03 DIAGNOSIS — N96 Recurrent pregnancy loss: Secondary | ICD-10-CM | POA: Diagnosis not present

## 2019-09-03 DIAGNOSIS — Z1509 Genetic susceptibility to other malignant neoplasm: Secondary | ICD-10-CM

## 2019-09-03 NOTE — Progress Notes (Signed)
[place]  Telephone:([phone]) [phone] Fax:(336) [phone]    The following is a corrected copy of the original 10/30/2016 dictation   [name] DOB: 11/25/1985  MR#: [id number]  KDX#:[id number]  Patient Care Team: Patient, No Pcp Per as PCP - General ([place]) Tkeya Stencil, Virgie Dad, MD as Consulting Physician (Oncology) Megan Salon, MD as Consulting Physician (Gynecology) PCP: Patient, No Pcp Per SU:  OTHER MD:  CHIEF COMPLAINT: BRCA 2 positivity  CURRENT TREATMENT: Intensified screening  HISTORY OF PRESENT ILLNESS: From the original intake note:  Dominique Cannon has a significant family history suggesting a hereditary breast cancer syndrome and her gynecologist [name] working with Dr. [name] referred her for genetic evaluation. 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 The other 19 genes tested were unremarkable.  Her subsequent history is as detailed below  INTERVAL HISTORY: [name] returns today for follow-up and treatment of her BRCA positivity. She was last seen here on 10/30/2018.   She was to have started intensified screening, but further MRIs and mammography have been held since the patient was initially pregnant and then breast-feeding.  While still breast-feeding she again became pregnant and is currently in her 16th week.  Since her last visit here, she underwent an obstetric <14 week ultrasound on 08/04/2019 showing: Single live intrauterine gestation at 12 weeks 1 day EGA by crown-rump length. Large subchronic hemorrhage was noted.  Slight thinning of the myometrium, recommend attention on follow-up imaging.   REVIEW OF SYSTEMS: Dominique Cannon is very stressed.  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  She has been advised to  limit her activity so as to avoid a miscarriage.  She loves her job and certainly does not want to lose her job but she still has not heard whether she is going to be able to continue to work from home even after the children start returning to the classroom.  She is still having some bleeding daily.  Recall she has a history of prior miscarriages.   PAST MEDICAL HISTORY: Past Medical History:  Diagnosis Date  . BRCA2 positive   . Family history of breast cancer in mother    mother age 37 died of breast cancer  . Family history of ovarian cancer     PAST SURGICAL HISTORY: Past Surgical History:  Procedure Laterality Date  . DILATION AND EVACUATION N/A 01/16/2017   Procedure: DILATATION AND EVACUATION;  Surgeon: Megan Salon, MD;  Location: [facility];  Service: Gynecology;  Laterality: N/A;  . WISDOM TOOTH EXTRACTION      FAMILY HISTORY Family History  Problem Relation Age of Onset  . Breast cancer Mother 53  . Ovarian cancer Maternal Grandmother        dx in her 28s  . Breast cancer Paternal Grandmother   . Cancer Maternal Uncle        NOS  . Other Other        positive genetic testing for "the gene"  . Breast cancer Other        MGF's sister  . Breast cancer Other        MGF's mother   The patient's mother died at the age of 33 from breast cancer. The patient's mother had one sister, in good health. The patient's mother's mother has a  history of ovarian cancer. One maternal great aunt also had a history of breast cancer. A second has tested positive for a gene mutation, but we do not have those records. On the paternal side there is also a history of breast cancer, likely postmenopausal   GYNECOLOGIC HISTORY:  Patient's last menstrual period was 05/11/2019. Menarche age 62, the patient is GX P1. She used a Mirena IUD for contraception.   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 Her  husband, Dian Situ, is also a Art therapist, and band leader. Mattalynn gave birth to her son, Dominique Cannon, on 07/24/2018.  He just started going to day care.    ADVANCED DIRECTIVES: Not in place   HEALTH MAINTENANCE: Social History   Tobacco Use  . Smoking status: Never Smoker  . Smokeless tobacco: Never Used  Substance Use Topics  . Alcohol use: Not Currently  . Drug use: No     Colonoscopy:  PAP:  Bone density:  Lipid panel:  No Known Allergies  Current Outpatient Medications  Medication Sig Dispense Refill  . Misc. Devices (BREAST PUMP) MISC 1 Units by Does not apply route as needed. (Patient not taking: Reported on 08/05/2019) 1 each 0  . Prenatal Vit-Fe Fumarate-FA (PRENATAL VITAMIN PO) Take 1 tablet by mouth daily.      No current facility-administered medications for this visit.     OBJECTIVE: Young white woman who appears anxious  Vitals:   09/03/19 1526  BP: 114/72  Pulse: 78  Resp: 18  Temp: 98.3 F (36.8 C)  SpO2: 100%   Wt Readings from Last 3 Encounters:  09/03/19 132 lb 3.2 oz (60 kg)  08/05/19 130 lb 3.2 oz (59.1 kg)  08/04/19 135 lb (61.2 kg)   Body mass index is 20.1 kg/m.    ECOG FS:1 - Symptomatic but completely ambulatory  Ocular: Sclerae unicteric, pupils round and equal Ear-nose-throat: Wearing a mask Lymphatic: No cervical or supraclavicular adenopathy Lungs no rales or rhonchi Heart regular rate and rhythm Abd soft, nontender, positive bowel sounds MSK no focal spinal tenderness, no joint edema Neuro: non-focal, well-oriented, appropriate affect Breasts: I do not palpate a suspicious mass in either breast.  There are no skin or nipple changes of concern.  Both axillae are benign.  LAB RESULTS:  CMP     Component Value Date/Time   NA 136 08/04/2019 1444   K 3.8 08/04/2019 1444   CL 104 08/04/2019 1444   CO2 24 08/04/2019 1444   GLUCOSE 105 (H) 08/04/2019 1444   BUN 9 08/04/2019 1444   CREATININE 0.52 08/04/2019 1444   CALCIUM 9.4  08/04/2019 1444   PROT 7.0 08/04/2019 1444   ALBUMIN 4.1 08/04/2019 1444   AST 16 08/04/2019 1444   ALT 12 08/04/2019 1444   ALKPHOS 45 08/04/2019 1444   BILITOT 0.5 08/04/2019 1444   GFRNONAA >60 08/04/2019 1444   GFRAA >60 08/04/2019 1444    INo results found for: SPEP, UPEP  Lab Results  Component Value Date   WBC 8.0 08/05/2019   NEUTROABS 5.9 08/05/2019   HGB 12.3 08/05/2019   HCT 36.8 08/05/2019   MCV 89 08/05/2019   PLT 324 08/05/2019      Chemistry      Component Value Date/Time   NA 136 08/04/2019 1444   K 3.8 08/04/2019 1444   CL 104 08/04/2019 1444   CO2 24 08/04/2019 1444   BUN 9 08/04/2019 1444  CREATININE 0.52 08/04/2019 1444      Component Value Date/Time   CALCIUM 9.4 08/04/2019 1444   ALKPHOS 45 08/04/2019 1444   AST 16 08/04/2019 1444   ALT 12 08/04/2019 1444   BILITOT 0.5 08/04/2019 1444       No results found for: LABCA2  No components found for: LABCA125  No results for input(s): INR in the last 168 hours.  Urinalysis No results found for: COLORURINE, APPEARANCEUR, LABSPEC, PHURINE, GLUCOSEU, HGBUR, BILIRUBINUR, KETONESUR, PROTEINUR, UROBILINOGEN, NITRITE, LEUKOCYTESUR  STUDIES: US Ob Comp Less 14 Wks  Result Date: 08/04/2019 CLINICAL DATA:  Approximately 10 week pregnancy, bleeding for 1 day EXAM: OBSTETRIC <14 WK ULTRASOUND TECHNIQUE: Transabdominal ultrasound was performed for evaluation of the gestation as well as the maternal uterus and adnexal regions. COMPARISON:  None for this gestation FINDINGS: Intrauterine gestational sac: Present, single Yolk sac:  Present Embryo:  Present Cardiac Activity: Present Heart Rate: 171 bpm CRL:   55.9 mm   12 w 1 d                  [place] EDC: 02/15/2020 Subchorionic hemorrhage:  Large subchronic hemorrhage Maternal uterus/adnexae: Developing posterior placenta extending LEFT. Mild thinning of the myometrium especially anteriorly, recommend attention on follow-up imaging. RIGHT ovary normal size and  morphology, 3.2 x 1.5 x 2.6 cm. LEFT ovary normal size and morphology 2.8 x 1.3 x 1.4 cm. No free pelvic fluid or adnexal masses. IMPRESSION: Single live intrauterine gestation at 12 weeks 1 day EGA by crown-rump length. Large subchronic hemorrhage. Slight thinning of the myometrium, recommend attention on follow-up imaging. Electronically Signed   By: Lavonia Dana M.D.   On: 08/04/2019 17:21    ASSESSMENT: 33 y.o. BRCA 2 positive [place],  woman with brest density category D   (1) patient has decided against bilateral mastectomies and BSO, at least until child-bearing and nursing are complete  (2) patient is not planning to start tamoxifen at this point  (3) patient is considering OCPs for birth control and ovarian cancer risk-reduction  (4) breast cancer screening: yearly mammogram and MRI, preferably 6 months apart; biannual exams  (5) ovarian cancer screening: pelvic US every 6 months with CA 125  PLAN: Maezie is in the second trimester of a high risk pregnancy.  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  It would also be safer with regard to the current pandemic.  I will be glad to write a letter stating this if necessary but apparently this has already been done.  She is waiting to hear a definitive determination.  We cannot do mammography or breast MRI with contrast while she is pregnant.  We could do ultrasounds but in the absence of a specific mass to image I do not think that would be helpful.  The exam today was reassuring.  I am going to see her again in 6 months.  Hopefully at that time we will be able to implement an intensified screening strategy as we originally discussed  She knows to call us for any other issue that  may develop before her next visit here.  Esperansa Sarabia, Virgie Dad, MD  09/03/19 4:48 PM Medical Oncology and Hematology [address]  [place], [place] [address] Tel. [phone]    Fax. [phone]  I, Jacqualyn Posey am acting as a Education administrator for Chauncey Cruel, MD.   I, Lurline Del MD, have reviewed the above documentation for accuracy and completeness, and I agree with the above.

## 2019-09-05 ENCOUNTER — Telehealth: Payer: Self-pay | Admitting: Oncology

## 2019-09-05 NOTE — Telephone Encounter (Signed)
I left a message regarding schedule  

## 2019-09-08 IMAGING — US US MFM OB COMP +14 WKS
1 series · 14 of 28 positions shown · non-contrast
Comparison: none

[Series 1: us mfm ob comp +14 wks · 14 of 54 slices shown]
[im 2/54]
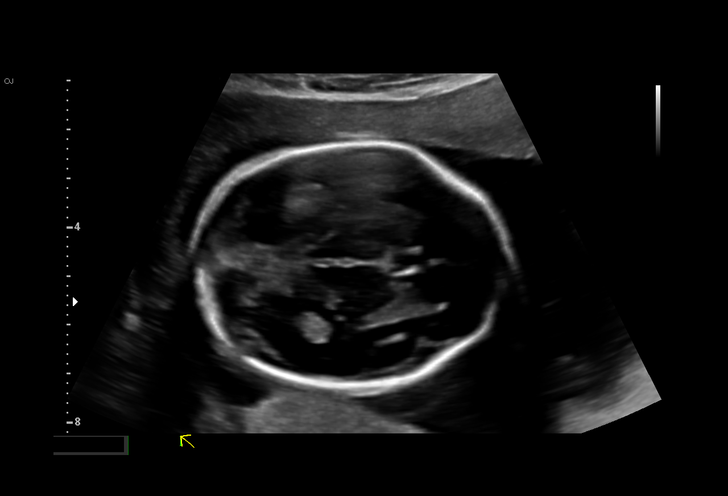
[im 6/54]
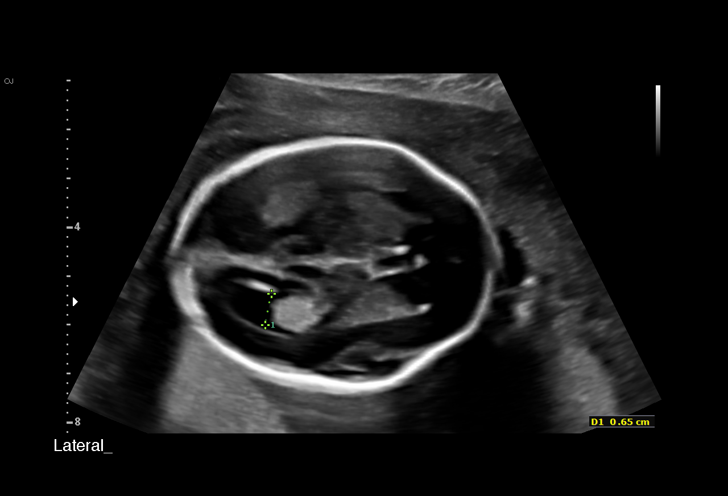
[im 10/54]
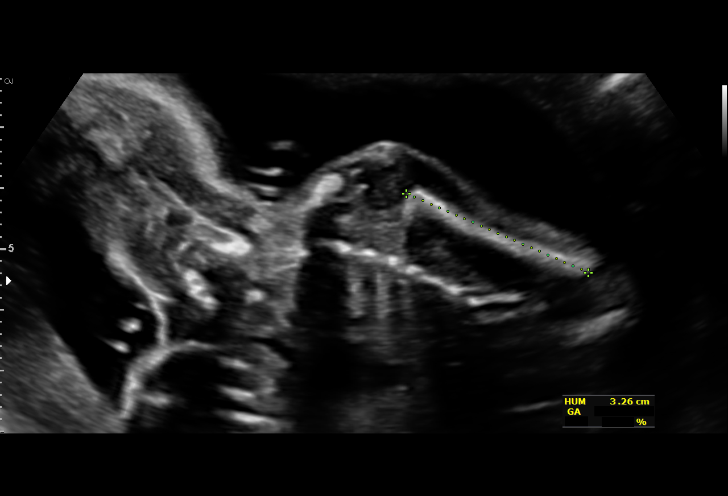
[im 14/54]
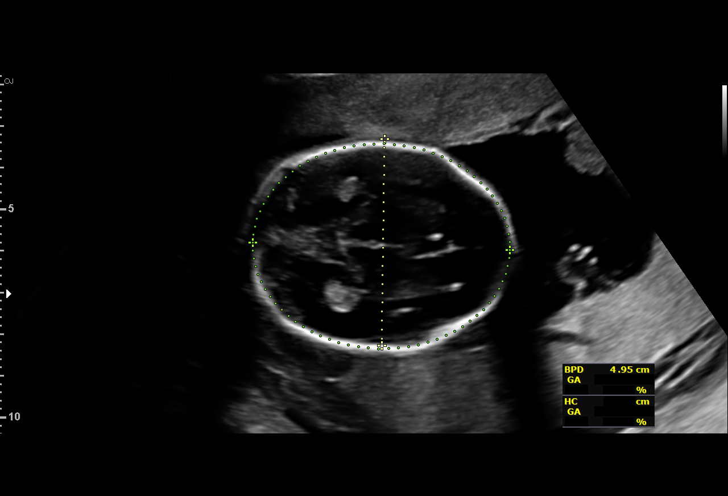
[im 18/54]
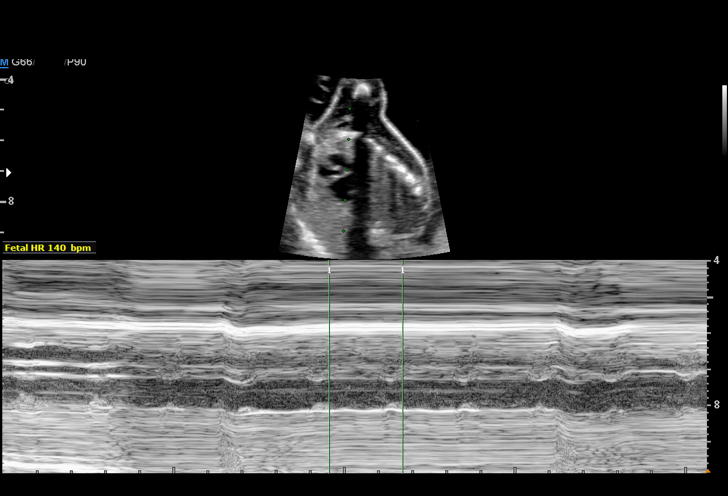
[im 22/54]
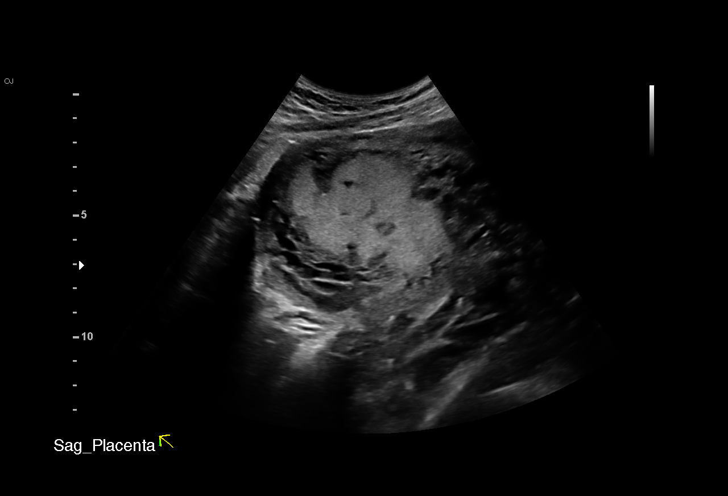
[im 26/54]
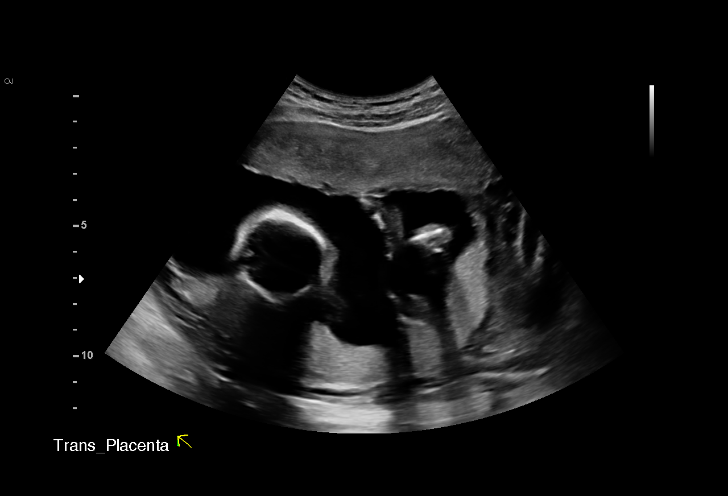
[im 30/54]
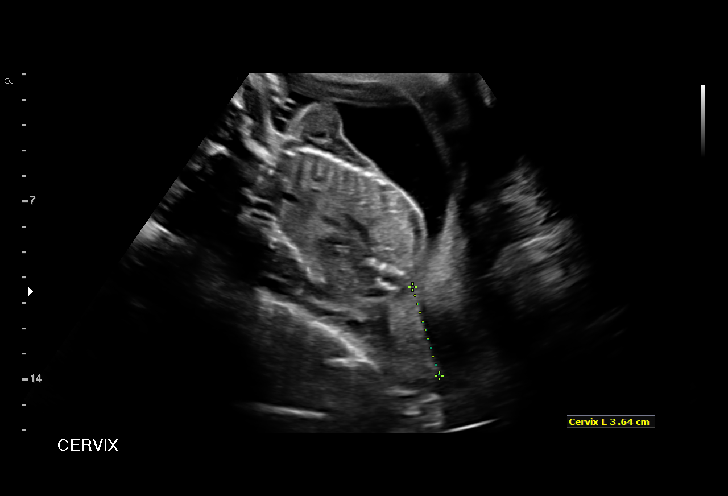
[im 34/54]
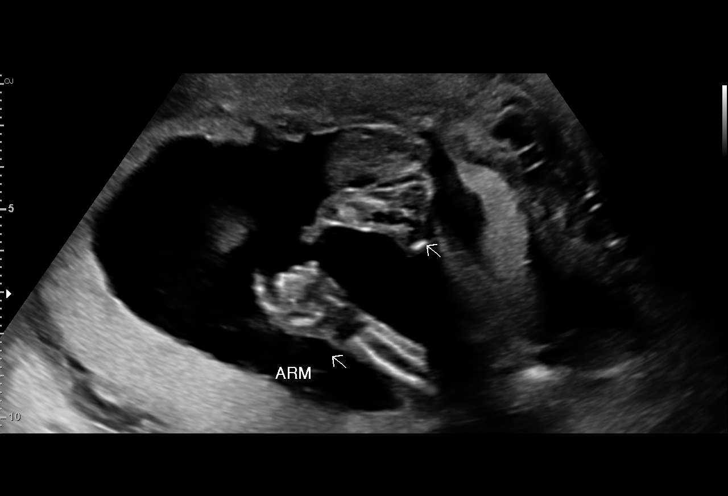
[im 38/54]
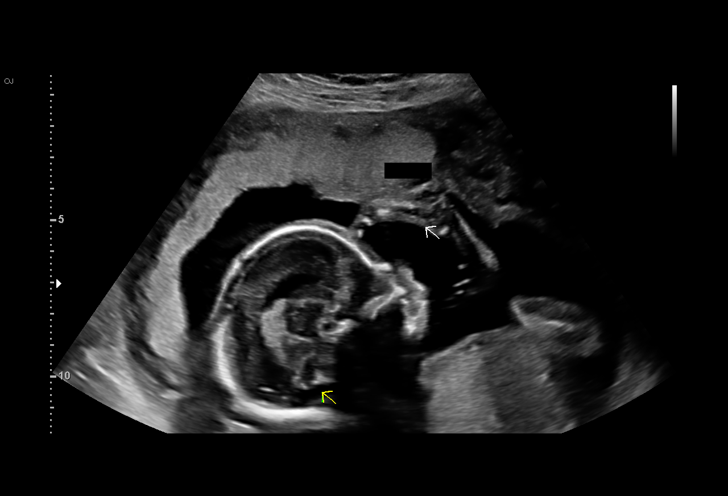
[im 42/54]
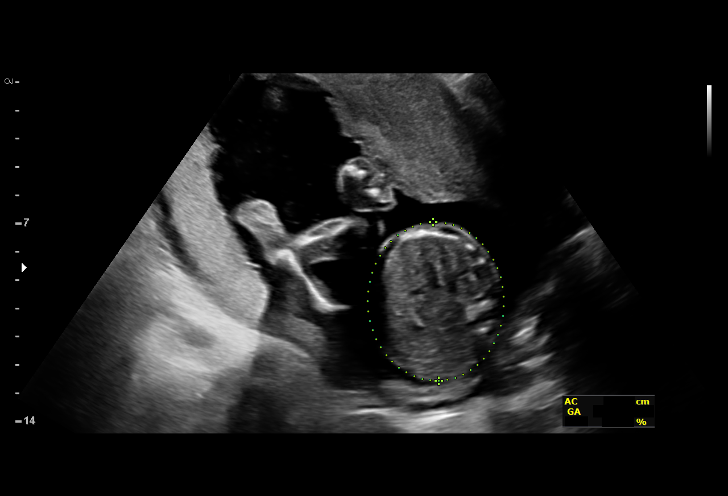
[im 46/54]
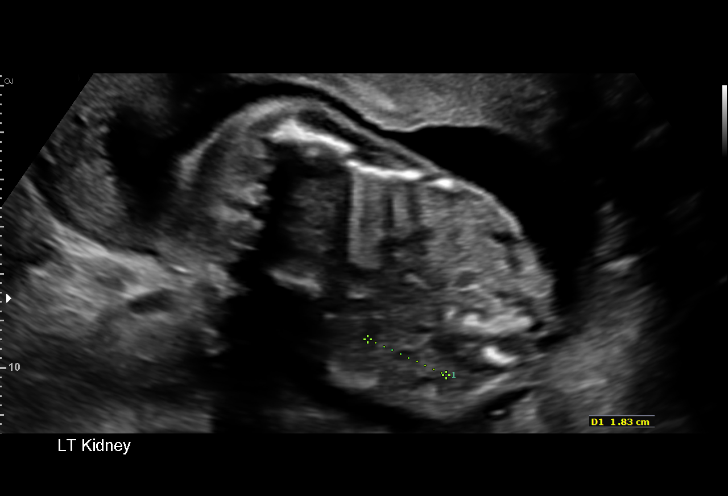
[im 50/54]
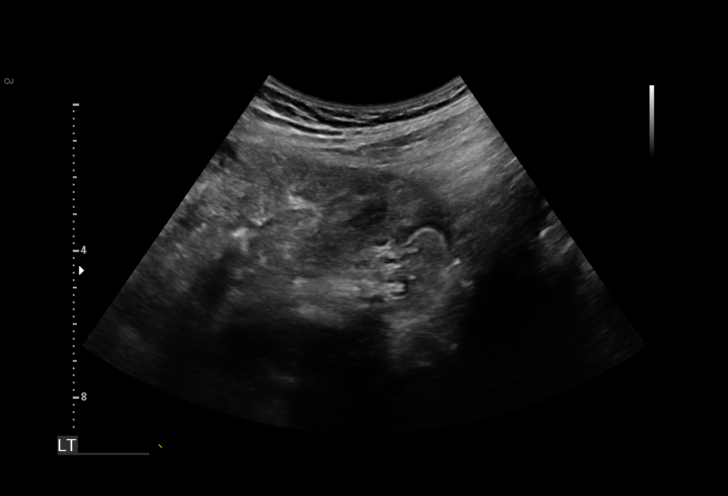
[im 54/54]
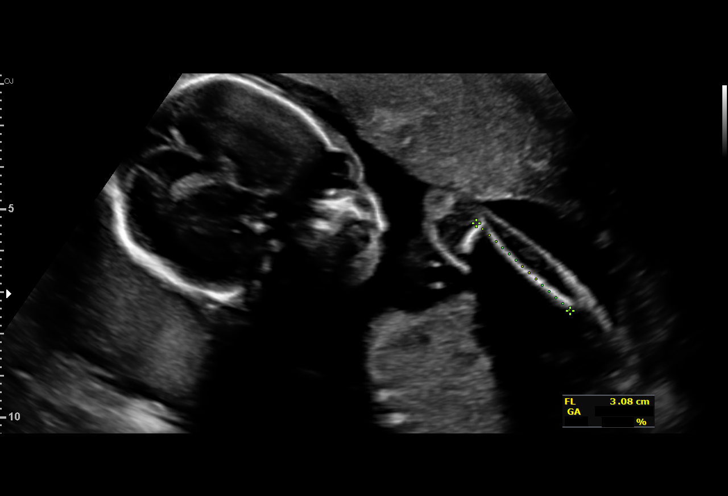

[14 of 28 positions shown; findings below may reference images not displayed]

[facility] [HOSPITAL]

1  CORRIE VOIGT         [id number]      [id number]     [id number]
Indications

20 weeks gestation of pregnancy
Encounter for antenatal screening for
chromosomal anomalies
OB History

Gravidity:    3         Term:   0        Prem:   0        SAB:   2
TOP:          0       Ectopic:  0        Living: 0
Fetal Evaluation

Num Of Fetuses:     1
Fetal Heart         140
Rate(bpm):
Cardiac Activity:   Observed
Presentation:       Transverse, head to maternal right
Placenta:           Posterior, above cervical os
P. Cord Insertion:  Visualized

Amniotic Fluid
AFI FV:      Subjectively within normal limits

Largest Pocket(cm)
5.67
Biometry

BPD:        49  mm     G. Age:  20w 6d         77  %    CI:        77.26   %    70 - 86
FL/HC:      17.5   %    16.8 -
HC:      176.5  mm     G. Age:  20w 1d         42  %    HC/AC:      1.09        1.09 -
AC:      161.9  mm     G. Age:  21w 2d         79  %    FL/BPD:     62.9   %
FL:       30.8  mm     G. Age:  19w 4d         22  %    FL/AC:      19.0   %    20 - 24
HUM:      32.2  mm     G. Age:  20w 5d         70  %
CER:      21.2  mm     G. Age:  20w 1d         51  %
CM:        7.7  mm

Est. FW:     356  gm    0 lb 13 oz      53  %
Gestational Age

LMP:           20w 1d        Date:  10/17/17                 EDD:   07/24/18
U/S Today:     20w 3d                                        EDD:   07/22/18
Best:          20w 1d     Det. By:  LMP  (10/17/17)          EDD:   07/24/18
Anatomy

Cranium:               Appears normal         Aortic Arch:            Appears normal
Cavum:                 Appears normal         Ductal Arch:            Not well visualized
Ventricles:            Appears normal         Diaphragm:              Not well visualized
Choroid Plexus:        Appears normal         Stomach:                Appears normal, left
sided
Cerebellum:            Appears normal         Abdomen:                Appears normal
Posterior Fossa:       Appears normal         Abdominal Wall:         Not well visualized
Nuchal Fold:           Not applicable (>20    Cord Vessels:           Appears normal (3
wks GA)                                        vessel cord)
Face:                  Appears normal         Kidneys:                Appear normal
(orbits and profile)
Lips:                  Appears normal         Bladder:                Appears normal
Thoracic:              Appears normal         Spine:                  Not well visualized
Heart:                 Not well visualized    Upper Extremities:      Appears normal
RVOT:                  Not well visualized    Lower Extremities:      Appears normal
LVOT:                  Not well visualized

Other:  Parents do not wish to know sex of fetus!!( appears to be a male)
Heels visualized. Technically difficult due to fetal position.
Cervix Uterus Adnexa

Cervix
Length:           3.64  cm.
Normal appearance by transabdominal scan.

Uterus
No abnormality visualized.

Left Ovary
Not visualized. No adnexal mass visualized.

Right Ovary
Size(cm)       3.5  x   2.8    x  1.8       Vol(ml):
Within normal limits.

Cul De Sac:   No free fluid seen.

Adnexa:       No abnormality visualized.
Impression

SIUP at 20+1 weeks
Normal detailed fetal anatomy; limited views of heart, CI and
spine
Markers of aneuploidy: none
Normal amniotic fluid volume
Measurements consistent with LMP dating
Recommendations

Follow-up ultrasound in 4-6 weeks to complete anatomy
survey

## 2019-09-23 ENCOUNTER — Other Ambulatory Visit (HOSPITAL_COMMUNITY): Payer: Self-pay | Admitting: *Deleted

## 2019-09-23 ENCOUNTER — Ambulatory Visit (HOSPITAL_COMMUNITY)
Admission: RE | Admit: 2019-09-23 | Discharge: 2019-09-23 | Disposition: A | Discharge status: Home / Self Care | Payer: BC Managed Care – PPO | Source: Ambulatory Visit | Attending: Obstetrics and Gynecology | Admitting: Obstetrics and Gynecology

## 2019-09-23 ENCOUNTER — Ambulatory Visit (HOSPITAL_COMMUNITY): Payer: BC Managed Care – PPO | Admitting: *Deleted

## 2019-09-23 ENCOUNTER — Encounter: Payer: Self-pay | Admitting: Obstetrics & Gynecology

## 2019-09-23 ENCOUNTER — Other Ambulatory Visit: Payer: Self-pay

## 2019-09-23 ENCOUNTER — Encounter (HOSPITAL_COMMUNITY): Payer: Self-pay

## 2019-09-23 DIAGNOSIS — Z3A19 19 weeks gestation of pregnancy: Secondary | ICD-10-CM

## 2019-09-23 DIAGNOSIS — O4592 Premature separation of placenta, unspecified, second trimester: Secondary | ICD-10-CM | POA: Diagnosis not present

## 2019-09-23 DIAGNOSIS — Z3689 Encounter for other specified antenatal screening: Secondary | ICD-10-CM | POA: Diagnosis present

## 2019-09-23 DIAGNOSIS — Z3480 Encounter for supervision of other normal pregnancy, unspecified trimester: Secondary | ICD-10-CM | POA: Insufficient documentation

## 2019-09-23 DIAGNOSIS — O418X10 Other specified disorders of amniotic fluid and membranes, first trimester, not applicable or unspecified: Secondary | ICD-10-CM

## 2019-09-23 DIAGNOSIS — O4692 Antepartum hemorrhage, unspecified, second trimester: Secondary | ICD-10-CM | POA: Diagnosis present

## 2019-09-23 DIAGNOSIS — O468X1 Other antepartum hemorrhage, first trimester: Secondary | ICD-10-CM | POA: Diagnosis present

## 2019-09-23 DIAGNOSIS — Z362 Encounter for other antenatal screening follow-up: Secondary | ICD-10-CM

## 2019-09-23 DIAGNOSIS — O418X90 Other specified disorders of amniotic fluid and membranes, unspecified trimester, not applicable or unspecified: Secondary | ICD-10-CM | POA: Insufficient documentation

## 2019-09-30 ENCOUNTER — Other Ambulatory Visit (HOSPITAL_COMMUNITY): Payer: Self-pay | Admitting: Obstetrics & Gynecology

## 2019-09-30 ENCOUNTER — Other Ambulatory Visit: Payer: Self-pay

## 2019-09-30 ENCOUNTER — Ambulatory Visit (INDEPENDENT_AMBULATORY_CARE_PROVIDER_SITE_OTHER): Payer: BC Managed Care – PPO | Admitting: Obstetrics & Gynecology

## 2019-09-30 VITALS — BP 114/80 | HR 91 | Wt 137.2 lb

## 2019-09-30 DIAGNOSIS — O418X20 Other specified disorders of amniotic fluid and membranes, second trimester, not applicable or unspecified: Secondary | ICD-10-CM

## 2019-09-30 DIAGNOSIS — O418X10 Other specified disorders of amniotic fluid and membranes, first trimester, not applicable or unspecified: Secondary | ICD-10-CM

## 2019-09-30 DIAGNOSIS — Z3A20 20 weeks gestation of pregnancy: Secondary | ICD-10-CM

## 2019-09-30 DIAGNOSIS — Z3480 Encounter for supervision of other normal pregnancy, unspecified trimester: Secondary | ICD-10-CM

## 2019-09-30 DIAGNOSIS — O468X2 Other antepartum hemorrhage, second trimester: Secondary | ICD-10-CM

## 2019-09-30 NOTE — Progress Notes (Signed)
   PRENATAL VISIT NOTE  Subjective:  Dominique Cannon is a 33 y.o. married G57P4211 (80 month old son) at 35w2dbeing seen today for ongoing prenatal care.  She is currently monitored for the following issues for this low-risk pregnancy and has Family history of breast cancer; Family history of ovarian cancer; Genetic testing; BRCA2 positive; History of recurrent miscarriages; Supervision of other normal pregnancy, antepartum; Vaginal bleeding in pregnancy, second trimester; and Subchorionic hematoma on their problem list.  Patient reports no complaints.   .  .  Movement: Present. Denies leaking of fluid.   The following portions of the patient's history were reviewed and updated as appropriate: allergies, current medications, past family history, past medical history, past social history, past surgical history and problem list.   Objective:   Vitals:   09/30/19 1451  BP: 114/80  Pulse: 91  Weight: 137 lb 3.2 oz (62.2 kg)    Fetal Status: Fetal Heart Rate (bpm): 143   Movement: Present     General:  Alert, oriented and cooperative. Patient is in no acute distress.  Skin: Skin is warm and dry. No rash noted.   Cardiovascular: Normal heart rate noted  Respiratory: Normal respiratory effort, no problems with respiration noted  Abdomen: Soft, gravid, appropriate for gestational age.  Pain/Pressure: Absent     Pelvic: Cervical exam deferred        Extremities: Normal range of motion.  Edema: None  Mental Status: Normal mood and affect. Normal behavior. Normal judgment and thought content.   Assessment and Plan:  Pregnancy: G4P1021 at 251w2d. Supervision of other normal pregnancy, antepartum  - AFP, Quad Screen - f/u MFM u/s in 2 weeks  Preterm labor symptoms and general obstetric precautions including but not limited to vaginal bleeding, contractions, leaking of fluid and fetal movement were reviewed in detail with the patient. Please refer to After Visit Summary for other counseling  recommendations.   No follow-ups on file.  Future Appointments  Date Time Provider DeInola12/06/2019  3:00 PM WH[place] WH-MFCUS MFC-US  10/21/2019  3:10 PM WHMelvinaURSE WHBlue EyeFC-US  01/16/2020  3:00 PM MiMegan SalonMD [facility]one  03/04/2020  1:30 PM Magrinat, GuVirgie DadMD [facility]    MyEmily FilbertMD

## 2019-10-02 LAB — AFP TETRA
DIA Mom Value: 0.23
DIA Value (EIA): 47.25 pg/mL
DSR (By Age)    1 IN: 527
DSR (Second Trimester) 1 IN: 8575
Gestational Age: 20.3 WEEKS
MSAFP Mom: 0.85
MSAFP: 53.3 ng/mL
MSHCG Mom: 0.43
MSHCG: 10901 m[IU]/mL
Maternal Age At EDD: 32 yr
Osb Risk: 10000
T18 (By Age): 1:2053 {titer}
Test Results:: NEGATIVE
Weight: 137 [lb_av]
uE3 Mom: 0.66
uE3 Value: 1.67 ng/mL

## 2019-10-21 ENCOUNTER — Encounter (HOSPITAL_COMMUNITY): Payer: Self-pay | Admitting: *Deleted

## 2019-10-21 ENCOUNTER — Other Ambulatory Visit: Payer: Self-pay

## 2019-10-21 ENCOUNTER — Ambulatory Visit (HOSPITAL_COMMUNITY)
Admission: RE | Admit: 2019-10-21 | Discharge: 2019-10-21 | Disposition: A | Discharge status: Home / Self Care | Payer: BC Managed Care – PPO | Source: Ambulatory Visit | Attending: Obstetrics and Gynecology | Admitting: Obstetrics and Gynecology

## 2019-10-21 ENCOUNTER — Ambulatory Visit (HOSPITAL_COMMUNITY): Payer: BC Managed Care – PPO | Admitting: *Deleted

## 2019-10-21 DIAGNOSIS — O4692 Antepartum hemorrhage, unspecified, second trimester: Secondary | ICD-10-CM

## 2019-10-21 DIAGNOSIS — O4592 Premature separation of placenta, unspecified, second trimester: Secondary | ICD-10-CM

## 2019-10-21 DIAGNOSIS — Z3A23 23 weeks gestation of pregnancy: Secondary | ICD-10-CM

## 2019-10-21 DIAGNOSIS — Z362 Encounter for other antenatal screening follow-up: Secondary | ICD-10-CM | POA: Insufficient documentation

## 2019-10-29 ENCOUNTER — Telehealth (INDEPENDENT_AMBULATORY_CARE_PROVIDER_SITE_OTHER): Payer: BC Managed Care – PPO | Admitting: Family Medicine

## 2019-10-29 ENCOUNTER — Encounter: Payer: Self-pay | Admitting: Family Medicine

## 2019-10-29 VITALS — BP 114/64

## 2019-10-29 DIAGNOSIS — O4692 Antepartum hemorrhage, unspecified, second trimester: Secondary | ICD-10-CM

## 2019-10-29 DIAGNOSIS — O418X10 Other specified disorders of amniotic fluid and membranes, first trimester, not applicable or unspecified: Secondary | ICD-10-CM

## 2019-10-29 DIAGNOSIS — Z3480 Encounter for supervision of other normal pregnancy, unspecified trimester: Secondary | ICD-10-CM

## 2019-10-29 DIAGNOSIS — Z1589 Genetic susceptibility to other disease: Secondary | ICD-10-CM

## 2019-10-29 DIAGNOSIS — Z3A24 24 weeks gestation of pregnancy: Secondary | ICD-10-CM

## 2019-10-29 DIAGNOSIS — Z1501 Genetic susceptibility to malignant neoplasm of breast: Secondary | ICD-10-CM

## 2019-10-29 DIAGNOSIS — O468X2 Other antepartum hemorrhage, second trimester: Secondary | ICD-10-CM

## 2019-10-29 DIAGNOSIS — Z1509 Genetic susceptibility to other malignant neoplasm: Secondary | ICD-10-CM

## 2019-10-29 DIAGNOSIS — N96 Recurrent pregnancy loss: Secondary | ICD-10-CM

## 2019-10-29 DIAGNOSIS — O418X20 Other specified disorders of amniotic fluid and membranes, second trimester, not applicable or unspecified: Secondary | ICD-10-CM

## 2019-10-29 NOTE — Progress Notes (Signed)
I connected with  Dominique Cannon on 10/29/19 at  2:15 PM EST by telephone and verified that I am speaking with the correct person using two identifiers.   I discussed the limitations, risks, security and privacy concerns of performing an evaluation and management service by telephone and the availability of in person appointments. I also discussed with the patient that there may be a patient responsible charge related to this service. The patient expressed understanding and agreed to proceed.  [name] [name], [place] 10/29/2019  2:50 PM

## 2019-10-29 NOTE — Progress Notes (Signed)
I connected with@ on 11/01/19 at  2:15 PM EST by: MyChart video and verified that I am speaking with the correct person using two identifiers.  Patient is located at Home and provider is located at [facility].     The purpose of this virtual visit is to provide medical care while limiting exposure to the novel coronavirus. I discussed the limitations, risks, security and privacy concerns of performing an evaluation and management service by Mychart and the availability of in person appointments. I also discussed with the patient that there may be a patient responsible charge related to this service. By engaging in this virtual visit, you consent to the provision of healthcare.  Additionally, you authorize for your insurance to be billed for the services provided during this visit.  The patient expressed understanding and agreed to proceed.  The following staff members participated in the virtual visit:  CMA    PRENATAL VISIT NOTE  Subjective:  Regine Butner is a 33 y.o. G4P1021 at [redacted]w[redacted]d  for phone visit for ongoing prenatal care.  She is currently monitored for the following issues for this low-risk pregnancy and has Family history of breast cancer; Family history of ovarian cancer; Genetic testing; BRCA2 positive; History of recurrent miscarriages; Supervision of other normal pregnancy, antepartum; Vaginal bleeding in pregnancy, second trimester; and Subchorionic hematoma on their problem list.  Patient reports no complaints.  Contractions: Not present.  .  Movement: Present. Denies leaking of fluid.   The following portions of the patient's history were reviewed and updated as appropriate: allergies, current medications, past family history, past medical history, past social history, past surgical history and problem list.   Objective:   Vitals:   10/29/19 1452  BP: 114/64   Self-Obtained  Fetal Status:     Movement: Present     Assessment and Plan:  Pregnancy: G4P1021 at [redacted]w[redacted]d 1. Vaginal  bleeding in pregnancy, second trimester None recently Reviewed US results and reduction of SCH size Length discussion about continued pelvic rest and possible slow return to activity  2. Supervision of other normal pregnancy, antepartum Up to date Recent US, SCH reduced in size.  Continue pelvic rest  3. History of recurrent miscarriages  4. BRCA2 positive Plan to review reproductive life plan and consider salpingectomy after desired family size is reached.  Recommend regular breast cancer screenings   5. Subchorionic hematoma in first trimester, single or unspecified fetus Improving, no bleeding and size reduced considerably  Preterm labor symptoms and general obstetric precautions including but not limited to vaginal bleeding, contractions, leaking of fluid and fetal movement were reviewed in detail with the patient.  Return in about 4 weeks (around 11/26/2019) for Routine prenatal care, in person.  Future Appointments  Date Time Provider Department Center  12/02/2019  8:15 AM Pickens, Charlie, MD [facility]  01/16/2020  3:00 PM Miller, Mary S, MD [facility] None  03/04/2020  1:30 PM Magrinat, Gustav C, MD [facility] None    Time spent on virtual visit: 15 minutes  Kimberly Niles Newton, MD   

## 2019-10-29 NOTE — Patient Instructions (Signed)
Subchorionic Hematoma ° °A subchorionic hematoma is a gathering of blood between the outer wall of the embryo (chorion) and the inner wall of the womb (uterus). °This condition can cause vaginal bleeding. If they cause little or no vaginal bleeding, early small hematomas usually shrink on their own and do not affect your baby or pregnancy. When bleeding starts later in pregnancy, or if the hematoma is larger or occurs in older pregnant women, the condition may be more serious. Larger hematomas may get bigger, which increases the chances of miscarriage. This condition also increases the risk of: °· Premature separation of the placenta from the uterus. °· Premature (preterm) labor. °· Stillbirth. °What are the causes? °The exact cause of this condition is not known. It occurs when blood is trapped between the placenta and the uterine wall because the placenta has separated from the original site of implantation. °What increases the risk? °You are more likely to develop this condition if: °· You were treated with fertility medicines. °· You conceived through in vitro fertilization (IVF). °What are the signs or symptoms? °Symptoms of this condition include: °· Vaginal spotting or bleeding. °· Contractions of the uterus. These cause abdominal pain. °Sometimes you may have no symptoms and the bleeding may only be seen when ultrasound images are taken (transvaginal ultrasound). °How is this diagnosed? °This condition is diagnosed based on a physical exam. This includes a pelvic exam. You may also have other tests, including: °· Blood tests. °· Urine tests. °· Ultrasound of the abdomen. °How is this treated? °Treatment for this condition can vary. Treatment may include: °· Watchful waiting. You will be monitored closely for any changes in bleeding. During this stage: °? The hematoma may be reabsorbed by the body. °? The hematoma may separate the fluid-filled space containing the embryo (gestational sac) from the wall of the  womb (endometrium). °· Medicines. °· Activity restriction. This may be needed until the bleeding stops. °Follow these instructions at home: °· Stay on bed rest if told to do so by your health care provider. °· Do not lift anything that is heavier than 10 lbs. (4.5 kg) or as told by your health care provider. °· Do not use any products that contain nicotine or tobacco, such as cigarettes and e-cigarettes. If you need help quitting, ask your health care provider. °· Track and write down the number of pads you use each day and how soaked (saturated) they are. °· Do not use tampons. °· Keep all follow-up visits as told by your health care provider. This is important. Your health care provider may ask you to have follow-up blood tests or ultrasound tests or both. °Contact a health care provider if: °· You have any vaginal bleeding. °· You have a fever. °Get help right away if: °· You have severe cramps in your stomach, back, abdomen, or pelvis. °· You pass large clots or tissue. Save any tissue for your health care provider to look at. °· You have more vaginal bleeding, and you faint or become lightheaded or weak. °Summary °· A subchorionic hematoma is a gathering of blood between the outer wall of the placenta and the uterus. °· This condition can cause vaginal bleeding. °· Sometimes you may have no symptoms and the bleeding may only be seen when ultrasound images are taken. °· Treatment may include watchful waiting, medicines, or activity restriction. °This information is not intended to replace advice given to you by your health care provider. Make sure you discuss any questions you   have with your health care provider. °Document Released: 02/14/2007 Document Revised: 10/12/2017 Document Reviewed: 12/26/2016 °Elsevier Patient Education © 2020 [employer] ° °

## 2019-11-14 NOTE — L&D Delivery Note (Addendum)
OB/GYN Faculty Practice Delivery Note  Dominique Cannon is a 34 y.o. [id number] s/p VD at [redacted]w[redacted]d. She was admitted for SOL.   ROM: 0h 65m with clear fluid GBS Status:  Negative/-- (03/09 1600)  Labor Progress: . Patient presented to L&D for SOL. Initial SVE: 8/100/0. She then progressed to complete.   Delivery Date/Time: 02/05/20; 1341 Delivery: Called to room and patient was complete and pushing. Head delivered OP. No nuchal cord present. Shoulder and body delivered in usual fashion. Infant with spontaneous cry, placed on mother's abdomen, dried and stimulated. Cord clamped x 2 after 1-minute delay, and cut by FOB. Cord blood drawn. Placenta delivered spontaneously with gentle cord traction. Fundus firm with massage and Pitocin. Labia, perineum, vagina, and cervix inspected inspected with 1st degree perineal laceration which was repaired in standard fashion.   Placenta: Sent to L&D Complications: None Lacerations: 1st degree perineal EBL: Per S. Malori Myers's attestation Analgesia: None  Infant: APGAR (1 MIN): Per S. Kjersten Ormiston's attestation APGAR (5 MINS): Per S. Valen Mascaro's attestation  Jackelyn Poling, DO 02/05/2020, 2:04 PM  I was gloved and present for entire delivery SVD without incident No difficulty with shoulders Direct OP with no restitution 1st degree perineal abrasion, hemostatic but poorly approximated and located in high friction, high tension area. Discussed with patient who was amenable to repair. Repair performed by me.  Newborn Weight: 3246g 1 min APGAR: 8 5 min APGAR: 9 QBL: [address], [place] 02/05/20 3:21 PM

## 2019-11-21 ENCOUNTER — Encounter: Payer: Self-pay | Admitting: Radiology

## 2019-12-02 ENCOUNTER — Encounter: Payer: Self-pay | Admitting: *Deleted

## 2019-12-02 ENCOUNTER — Other Ambulatory Visit: Payer: Self-pay | Admitting: *Deleted

## 2019-12-02 ENCOUNTER — Other Ambulatory Visit: Payer: Self-pay

## 2019-12-02 ENCOUNTER — Ambulatory Visit (INDEPENDENT_AMBULATORY_CARE_PROVIDER_SITE_OTHER): Payer: BC Managed Care – PPO | Admitting: Obstetrics and Gynecology

## 2019-12-02 VITALS — BP 95/66 | HR 92 | Wt 149.0 lb

## 2019-12-02 DIAGNOSIS — Z23 Encounter for immunization: Secondary | ICD-10-CM | POA: Diagnosis not present

## 2019-12-02 DIAGNOSIS — Z3480 Encounter for supervision of other normal pregnancy, unspecified trimester: Secondary | ICD-10-CM

## 2019-12-02 DIAGNOSIS — Z3A29 29 weeks gestation of pregnancy: Secondary | ICD-10-CM

## 2019-12-02 DIAGNOSIS — O0993 Supervision of high risk pregnancy, unspecified, third trimester: Secondary | ICD-10-CM

## 2019-12-02 NOTE — Progress Notes (Signed)
Prenatal Visit Note Date: 12/02/2019 Clinic: [facility]  Subjective:  Dominique Cannon is a 34 y.o. [phone] at 14w2dbeing seen today for ongoing prenatal care.  She is currently monitored for the following issues for this high-risk pregnancy and has Family history of breast cancer; Family history of ovarian cancer; Genetic testing; BRCA2 positive; History of recurrent miscarriages; Supervision of other normal pregnancy, antepartum; Vaginal bleeding in pregnancy, second trimester; and Subchorionic hematoma on their problem list.  Patient reports no complaints.   Contractions: Not present. Vag. Bleeding: None.  Movement: Present. Denies leaking of fluid.   The following portions of the patient's history were reviewed and updated as appropriate: allergies, current medications, past family history, past medical history, past social history, past surgical history and problem list. Problem list updated.  Objective:   Vitals:   12/02/19 0838  BP: 95/66  Pulse: 92  Weight: 149 lb (67.6 kg)    Fetal Status: Fetal Heart Rate (bpm): 134 Fundal Height: 30 cm Movement: Present     General:  Alert, oriented and cooperative. Patient is in no acute distress.  Skin: Skin is warm and dry. No rash noted.   Cardiovascular: Normal heart rate noted  Respiratory: Normal respiratory effort, no problems with respiration noted  Abdomen: Soft, gravid, appropriate for gestational age. Pain/Pressure: Absent     Pelvic:  Cervical exam deferred        Extremities: Normal range of motion.  Edema: None  Mental Status: Normal mood and affect. Normal behavior. Normal judgment and thought content.   Urinalysis:      Assessment and Plan:  Pregnancy: G4P1021 at 237w2d1. 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 D/w her that covid risk is very minimal with either situation if  any changes to let us[place]oreanow - VITAMIN D 25 Hydroxy (Vit-D Deficiency, Fractures) - Glucose Tolerance, 2 Hours w/1 Hour - CBC - RPR - HIV Antibody (routine testing w rflx)  Preterm labor symptoms and general obstetric precautions including but not limited to vaginal bleeding, contractions, leaking of fluid and fetal movement were reviewed in detail with the patient. Please refer to After Visit Summary for other counseling recommendations.  Return in about 2 weeks (around 12/16/2019).   PiAletha HalimMD

## 2019-12-03 LAB — CBC
Hematocrit: 35.8 % (ref 34.0–46.6)
Hemoglobin: 11.7 g/dL (ref 11.1–15.9)
MCH: 28.3 pg (ref 26.6–33.0)
MCHC: 32.7 g/dL (ref 31.5–35.7)
MCV: 87 fL (ref 79–97)
Platelets: 312 10*3/uL (ref 150–450)
RBC: 4.13 x10E6/uL (ref 3.77–5.28)
RDW: 12.7 % (ref 11.7–15.4)
WBC: 10.6 10*3/uL (ref 3.4–10.8)

## 2019-12-03 LAB — HIV ANTIBODY (ROUTINE TESTING W REFLEX): HIV Screen 4th Generation wRfx: NONREACTIVE

## 2019-12-03 LAB — GLUCOSE TOLERANCE, 2 HOURS W/ 1HR
Glucose, 1 hour: 97 mg/dL (ref 65–179)
Glucose, 2 hour: 85 mg/dL (ref 65–152)
Glucose, Fasting: 74 mg/dL (ref 65–91)

## 2019-12-03 LAB — RPR: RPR Ser Ql: NONREACTIVE

## 2019-12-03 LAB — VITAMIN D 25 HYDROXY (VIT D DEFICIENCY, FRACTURES): Vit D, 25-Hydroxy: 36 ng/mL (ref 30.0–100.0)

## 2019-12-16 ENCOUNTER — Other Ambulatory Visit: Payer: Self-pay

## 2019-12-16 ENCOUNTER — Telehealth (INDEPENDENT_AMBULATORY_CARE_PROVIDER_SITE_OTHER): Payer: BC Managed Care – PPO | Admitting: Family Medicine

## 2019-12-16 ENCOUNTER — Encounter: Payer: Self-pay | Admitting: Family Medicine

## 2019-12-16 VITALS — BP 105/62 | Wt 149.0 lb

## 2019-12-16 DIAGNOSIS — O09293 Supervision of pregnancy with other poor reproductive or obstetric history, third trimester: Secondary | ICD-10-CM

## 2019-12-16 DIAGNOSIS — Z3480 Encounter for supervision of other normal pregnancy, unspecified trimester: Secondary | ICD-10-CM

## 2019-12-16 DIAGNOSIS — O09299 Supervision of pregnancy with other poor reproductive or obstetric history, unspecified trimester: Secondary | ICD-10-CM | POA: Insufficient documentation

## 2019-12-16 DIAGNOSIS — O4693 Antepartum hemorrhage, unspecified, third trimester: Secondary | ICD-10-CM

## 2019-12-16 DIAGNOSIS — Z3A31 31 weeks gestation of pregnancy: Secondary | ICD-10-CM

## 2019-12-16 DIAGNOSIS — O4692 Antepartum hemorrhage, unspecified, second trimester: Secondary | ICD-10-CM

## 2019-12-16 NOTE — Progress Notes (Signed)
    TELEHEALTH OBSTETRICS PRENATAL VIRTUAL VIDEO VISIT ENCOUNTER NOTE  Provider location: [facility] [place]   I connected with Dominique Cannon on 12/16/19 at  3:30 PM EST by MyChart Video Encounter at home and verified that I am speaking with the correct person using two identifiers.   I discussed the limitations, risks, security and privacy concerns of performing an evaluation and management service virtually and the availability of in person appointments. I also discussed with the patient that there may be a patient responsible charge related to this service. The patient expressed understanding and agreed to proceed. Subjective:  Dominique Cannon is a 33 y.o. G4P1021 at [redacted]w[redacted]d being seen today for ongoing prenatal care.  She is currently monitored for the following issues for this low-risk pregnancy and has Family history of breast cancer; Family history of ovarian cancer; Genetic testing; BRCA2 positive; History of recurrent miscarriages; Supervision of other normal pregnancy, antepartum; Vaginal bleeding in pregnancy, second trimester; Subchorionic hematoma; and History of shoulder dystocia in prior pregnancy, currently pregnant on their problem list.  Patient reports no complaints.  Contractions: Not present. Vag. Bleeding: None.  Movement: Present. Denies any leaking of fluid.   The following portions of the patient's history were reviewed and updated as appropriate: allergies, current medications, past family history, past medical history, past social history, past surgical history and problem list.   Objective:   Vitals:   12/16/19 1533  BP: 105/62  Weight: 149 lb (67.6 kg)    Fetal Status:     Movement: Present     General:  Alert, oriented and cooperative. Patient is in no acute distress.  Respiratory: Normal respiratory effort, no problems with respiration noted  Mental Status: Normal mood and affect. Normal behavior. Normal judgment and thought content.    Rest of physical exam deferred due to type of encounter  Imaging: No results found.  Assessment and Plan:  Pregnancy: G4P1021 at [redacted]w[redacted]d 1. Vaginal bleeding in pregnancy, second trimester Has SCH--no bleeding  2. Supervision of other normal pregnancy, antepartum Continue routine prenatal care.   3. History of shoulder dystocia in prior pregnancy, currently pregnant Discussed options of IOL vs. PCS at 39 wks. Will assess fetal weight and make na informed decision then. She is risk averse. Unsure about more kids. She understands risk of SD and CS.  Preterm labor symptoms and general obstetric precautions including but not limited to vaginal bleeding, contractions, leaking of fluid and fetal movement were reviewed in detail with the patient. I discussed the assessment and treatment plan with the patient. The patient was provided an opportunity to ask questions and all were answered. The patient agreed with the plan and demonstrated an understanding of the instructions. The patient was advised to call back or seek an in-person office evaluation/[facility] for any urgent or concerning symptoms. Please refer to After Visit Summary for other counseling recommendations.   I provided 14 minutes of face-to-face time during this encounter.  Return in 2 weeks (on 12/30/2019) for virtual.  Future Appointments  Date Time Provider Department Center  12/29/2019  3:00 PM Pratt, Tanya S, MD [facility]  01/16/2020  3:00 PM Miller, Mary S, MD [facility] None  03/04/2020  1:30 PM Magrinat, Gustav C, MD [facility] None    Tanya S Pratt, MD [facility]  

## 2019-12-16 NOTE — Progress Notes (Signed)
I connected with  Dominique Cannon on 12/16/19 at  3:30 PM EST by telephone and verified that I am speaking with the correct person using two identifiers.   I discussed the limitations, risks, security and privacy concerns of performing an evaluation and management service by telephone and the availability of in person appointments. I also discussed with the patient that there may be a patient responsible charge related to this service. The patient expressed understanding and agreed to proceed.  Scheryl Marten, RN 12/16/2019  3:34 PM

## 2019-12-16 NOTE — Patient Instructions (Signed)

## 2019-12-29 ENCOUNTER — Telehealth (INDEPENDENT_AMBULATORY_CARE_PROVIDER_SITE_OTHER): Payer: BC Managed Care – PPO | Admitting: Family Medicine

## 2019-12-29 ENCOUNTER — Other Ambulatory Visit: Payer: Self-pay

## 2019-12-29 ENCOUNTER — Encounter: Payer: Self-pay | Admitting: Family Medicine

## 2019-12-29 VITALS — BP 102/69

## 2019-12-29 DIAGNOSIS — O4693 Antepartum hemorrhage, unspecified, third trimester: Secondary | ICD-10-CM

## 2019-12-29 DIAGNOSIS — O4692 Antepartum hemorrhage, unspecified, second trimester: Secondary | ICD-10-CM

## 2019-12-29 DIAGNOSIS — O09299 Supervision of pregnancy with other poor reproductive or obstetric history, unspecified trimester: Secondary | ICD-10-CM

## 2019-12-29 DIAGNOSIS — O09293 Supervision of pregnancy with other poor reproductive or obstetric history, third trimester: Secondary | ICD-10-CM

## 2019-12-29 DIAGNOSIS — Z3480 Encounter for supervision of other normal pregnancy, unspecified trimester: Secondary | ICD-10-CM

## 2019-12-29 DIAGNOSIS — Z3A33 33 weeks gestation of pregnancy: Secondary | ICD-10-CM

## 2019-12-29 NOTE — Patient Instructions (Signed)

## 2019-12-29 NOTE — Progress Notes (Signed)
    TELEHEALTH OBSTETRICS PRENATAL VIRTUAL VIDEO VISIT ENCOUNTER NOTE  Provider location: [facility] [place] [facility]   I connected with Dominique Cannon on 12/29/19 at  3:00 PM EST by MyChart Video Encounter at home and verified that I am speaking with the correct person using two identifiers.   I discussed the limitations, risks, security and privacy concerns of performing an evaluation and management service virtually and the availability of in person appointments. I also discussed with the patient that there may be a patient responsible charge related to this service. The patient expressed understanding and agreed to proceed. Subjective:  Dominique Cannon is a 34 y.o. G4P1021 at 36w1dbeing seen today for ongoing prenatal care.  She is currently monitored for the following issues for this low-risk pregnancy and has Family history of breast cancer; Family history of ovarian cancer; Genetic testing; BRCA2 positive; History of recurrent miscarriages; Supervision of other normal pregnancy, antepartum; Vaginal bleeding in pregnancy, second trimester; Subchorionic hematoma; and History of shoulder dystocia in prior pregnancy, currently pregnant on their problem list.  Patient reports no complaints.  Contractions: Not present. Vag. Bleeding: None.  Movement: Present. Denies any leaking of fluid.   The following portions of the patient's history were reviewed and updated as appropriate: allergies, current medications, past family history, past medical history, past social history, past surgical history and problem list.   Objective:   Vitals:   12/29/19 1458  BP: 102/69    Fetal Status:     Movement: Present     General:  Alert, oriented and cooperative. Patient is in no acute distress.  Respiratory: Normal respiratory effort, no problems with respiration noted  Mental Status: Normal mood and affect. Normal behavior. Normal judgment and thought content.  Rest of physical exam  deferred due to type of encounter  Imaging: No results found.  Assessment and Plan:  Pregnancy: G4P1021 at 358w1d. Vaginal bleeding in pregnancy, second trimester   2. Supervision of other normal pregnancy, antepartum Continue routine prenatal care.   3. History of shoulder dystocia in prior pregnancy, currently pregnant F/u u/s for growth prior to decision about delivery method. - [place] OB FOLLOW UP; Future  Preterm labor symptoms and general obstetric precautions including but not limited to vaginal bleeding, contractions, leaking of fluid and fetal movement were reviewed in detail with the patient. I discussed the assessment and treatment plan with the patient. The patient was provided an opportunity to ask questions and all were answered. The patient agreed with the plan and demonstrated an understanding of the instructions. The patient was advised to call back or seek an in-person office evaluation/[facility] any [facility] or concerning symptoms. Please refer to After Visit Summary for other counseling recommendations.   I provided 8 minutes of face-to-face time during this encounter.  Return in 3 weeks (on 01/19/2020) for in person.  Future Appointments  Date Time Provider DeLas Flores3/03/2020  3:00 PM MiMegan SalonMD [facility]  01/20/2020  4:00 PM PrDonnamae JudeMD [facility]  01/22/2020  3:00 PM WHRosepineSKorea WH-MFCUS MFC-US  03/04/2020  1:30 PM Magrinat, GuVirgie DadMD [facility] None    TaDonnamae JudeMD [facility]

## 2019-12-29 NOTE — Progress Notes (Signed)
I connected with  Dominique Cannon on 12/29/19 at  3:00 PM EST by telephone and verified that I am speaking with the correct person using two identifiers.   I discussed the limitations, risks, security and privacy concerns of performing an evaluation and management service by telephone and the availability of in person appointments. I also discussed with the patient that there may be a patient responsible charge related to this service. The patient expressed understanding and agreed to proceed.  Scheryl Marten, RN 12/29/2019  2:59 PM

## 2020-01-12 ENCOUNTER — Telehealth: Payer: BC Managed Care – PPO | Admitting: Obstetrics and Gynecology

## 2020-01-16 ENCOUNTER — Ambulatory Visit: Payer: BC Managed Care – PPO | Admitting: Obstetrics & Gynecology

## 2020-01-20 ENCOUNTER — Ambulatory Visit (INDEPENDENT_AMBULATORY_CARE_PROVIDER_SITE_OTHER): Payer: BC Managed Care – PPO | Admitting: Family Medicine

## 2020-01-20 ENCOUNTER — Other Ambulatory Visit: Payer: Self-pay

## 2020-01-20 VITALS — BP 121/79 | HR 74 | Wt 160.0 lb

## 2020-01-20 DIAGNOSIS — Z3480 Encounter for supervision of other normal pregnancy, unspecified trimester: Secondary | ICD-10-CM

## 2020-01-20 DIAGNOSIS — Z3A36 36 weeks gestation of pregnancy: Secondary | ICD-10-CM

## 2020-01-20 DIAGNOSIS — O09299 Supervision of pregnancy with other poor reproductive or obstetric history, unspecified trimester: Secondary | ICD-10-CM

## 2020-01-20 DIAGNOSIS — Z113 Encounter for screening for infections with a predominantly sexual mode of transmission: Secondary | ICD-10-CM

## 2020-01-20 NOTE — Progress Notes (Signed)
   PRENATAL VISIT NOTE  Subjective:  Dominique Cannon is a 34 y.o. [phone] at 67w2dbeing seen today for ongoing prenatal care.  She is currently monitored for the following issues for this low-risk pregnancy and has Family history of breast cancer; Family history of ovarian cancer; Genetic testing; BRCA2 positive; History of recurrent miscarriages; Supervision of other normal pregnancy, antepartum; Vaginal bleeding in pregnancy, second trimester; Subchorionic hematoma; and History of shoulder dystocia in prior pregnancy, currently pregnant on their problem list.  Patient reports no complaints.  Contractions: Not present. Vag. Bleeding: None.  Movement: Present. Denies leaking of fluid.   The following portions of the patient's history were reviewed and updated as appropriate: allergies, current medications, past family history, past medical history, past social history, past surgical history and problem list.   Objective:   Vitals:   01/20/20 1602  BP: 121/79  Pulse: 74  Weight: 160 lb (72.6 kg)    Fetal Status: Fetal Heart Rate (bpm): 132 Fundal Height: 34 cm Movement: Present  Presentation: Vertex  General:  Alert, oriented and cooperative. Patient is in no acute distress.  Skin: Skin is warm and dry. No rash noted.   Cardiovascular: Normal heart rate noted  Respiratory: Normal respiratory effort, no problems with respiration noted  Abdomen: Soft, gravid, appropriate for gestational age.  Pain/Pressure: Absent     Pelvic: Cervical exam performed Dilation: 1.5 Effacement (%): 70 Station: -2  Extremities: Normal range of motion.  Edema: None  Mental Status: Normal mood and affect. Normal behavior. Normal judgment and thought content.   Assessment and Plan:  Pregnancy: G4P1021 at 393w2d. Supervision of other normal pregnancy, antepartum Cultures today - Culture, beta strep (group b only) - GC/Chlamydia probe amp ([place]. History of shoulder dystocia in prior  pregnancy, currently pregnant Has u/s for growth in 2 days. Will decide on method of delivery once that is done.  Preterm labor symptoms and general obstetric precautions including but not limited to vaginal bleeding, contractions, leaking of fluid and fetal movement were reviewed in detail with the patient. Please refer to After Visit Summary for other counseling recommendations.   Return in 1 week (on 01/27/2020).  Future Appointments  Date Time Provider DeEnosburg Falls3/09/2020  3:00 PM WHHamiltonSKorea WH-MFCUS MFC-US  01/28/2020  2:30 PM WeDarlina RumpfCNM [facility]A [facility]  03/04/2020  1:30 PM Magrinat, GuVirgie DadMD [facility] None    TaDonnamae JudeMD

## 2020-01-20 NOTE — Patient Instructions (Signed)

## 2020-01-22 ENCOUNTER — Other Ambulatory Visit: Payer: Self-pay

## 2020-01-22 ENCOUNTER — Ambulatory Visit (HOSPITAL_COMMUNITY)
Admission: RE | Admit: 2020-01-22 | Discharge: 2020-01-22 | Disposition: A | Discharge status: Home / Self Care | Payer: BC Managed Care – PPO | Source: Ambulatory Visit | Attending: Obstetrics and Gynecology | Admitting: Obstetrics and Gynecology

## 2020-01-22 DIAGNOSIS — O09293 Supervision of pregnancy with other poor reproductive or obstetric history, third trimester: Secondary | ICD-10-CM | POA: Diagnosis not present

## 2020-01-22 DIAGNOSIS — Z364 Encounter for antenatal screening for fetal growth retardation: Secondary | ICD-10-CM

## 2020-01-22 DIAGNOSIS — O09299 Supervision of pregnancy with other poor reproductive or obstetric history, unspecified trimester: Secondary | ICD-10-CM | POA: Diagnosis present

## 2020-01-22 DIAGNOSIS — Z3A36 36 weeks gestation of pregnancy: Secondary | ICD-10-CM

## 2020-01-22 LAB — GC/CHLAMYDIA PROBE AMP (~~LOC~~) NOT AT ARMC
Chlamydia: NEGATIVE
Comment: NEGATIVE
Comment: NORMAL
Neisseria Gonorrhea: NEGATIVE

## 2020-01-24 LAB — CULTURE, BETA STREP (GROUP B ONLY): Strep Gp B Culture: NEGATIVE

## 2020-01-26 NOTE — Addendum Note (Signed)
Addended by: Reva Bores on: 01/26/2020 08:23 AM   Modules accepted: Orders, SmartSet

## 2020-01-28 ENCOUNTER — Ambulatory Visit (INDEPENDENT_AMBULATORY_CARE_PROVIDER_SITE_OTHER): Payer: BC Managed Care – PPO | Admitting: Advanced Practice Midwife

## 2020-01-28 ENCOUNTER — Other Ambulatory Visit: Payer: Self-pay

## 2020-01-28 VITALS — BP 130/83 | HR 75 | Wt 164.4 lb

## 2020-01-28 DIAGNOSIS — O09299 Supervision of pregnancy with other poor reproductive or obstetric history, unspecified trimester: Secondary | ICD-10-CM

## 2020-01-28 DIAGNOSIS — Z3480 Encounter for supervision of other normal pregnancy, unspecified trimester: Secondary | ICD-10-CM

## 2020-01-28 DIAGNOSIS — Z3A37 37 weeks gestation of pregnancy: Secondary | ICD-10-CM

## 2020-01-28 NOTE — Progress Notes (Signed)
   PRENATAL VISIT NOTE  Subjective:  Dominique Cannon is a 34 y.o. [phone] at 51w3dbeing seen today for ongoing prenatal care.  She is currently monitored for the following issues for this low-risk pregnancy and has Family history of breast cancer; Family history of ovarian cancer; Genetic testing; BRCA2 positive; History of recurrent miscarriages; Supervision of other normal pregnancy, antepartum; Vaginal bleeding in pregnancy, second trimester; Subchorionic hematoma; and History of shoulder dystocia in prior pregnancy, currently pregnant on their problem list.  Patient reports no complaints.  Contractions: Not present. Vag. Bleeding: None.  Movement: Present. Denies leaking of fluid.   The following portions of the patient's history were reviewed and updated as appropriate: allergies, current medications, past family history, past medical history, past social history, past surgical history and problem list. Problem list updated.  Objective:   Vitals:   01/28/20 1451  BP: 130/83  Pulse: 75  Weight: 164 lb 6.4 oz (74.6 kg)    Fetal Status: Fetal Heart Rate (bpm): 125 Fundal Height: 35 cm Movement: Present  Presentation: Vertex  General:  Alert, oriented and cooperative. Patient is in no acute distress.  Skin: Skin is warm and dry. No rash noted.   Cardiovascular: Normal heart rate noted  Respiratory: Normal respiratory effort, no problems with respiration noted  Abdomen: Soft, gravid, appropriate for gestational age.  Pain/Pressure: Absent     Pelvic: Cervical exam deferred        Extremities: Normal range of motion.  Edema: None  Mental Status: Normal mood and affect. Normal behavior. Normal judgment and thought content.   Assessment and Plan:  Pregnancy: G4P1021 at 34w3d1. Supervision of other normal pregnancy, antepartum - 1.5 on VE last appointment  2. History of shoulder dystocia in prior pregnancy, currently pregnant - 30 seconds, resolved with McRoberts and delivery of  posterior arm - S/p MFM growth scan. EFW 2661g on 03/11 at 3654w4dDesFitzgerald 39.0 Ga. Orders previously placed  Term labor symptoms and general obstetric precautions including but not limited to vaginal bleeding, contractions, leaking of fluid and fetal movement were reviewed in detail with the patient. Please refer to After Visit Summary for other counseling recommendations.    Future Appointments  Date Time Provider DepMora/22/2021  2:15 PM NewCaren MacadamD [facility]  02/08/2020 12:00 AM MC-LD SCH[place]-INDC None  03/04/2020  1:30 PM Magrinat, GusVirgie DadD [facility]    SamDarlina RumpfNM

## 2020-01-28 NOTE — Patient Instructions (Signed)

## 2020-01-29 ENCOUNTER — Telehealth (HOSPITAL_COMMUNITY): Payer: Self-pay | Admitting: *Deleted

## 2020-01-29 NOTE — Telephone Encounter (Signed)
Preadmission screen  

## 2020-01-30 ENCOUNTER — Telehealth (HOSPITAL_COMMUNITY): Payer: Self-pay | Admitting: *Deleted

## 2020-01-30 NOTE — Telephone Encounter (Signed)
Preadmission screen  

## 2020-02-02 ENCOUNTER — Telehealth (INDEPENDENT_AMBULATORY_CARE_PROVIDER_SITE_OTHER): Payer: BC Managed Care – PPO | Admitting: Family Medicine

## 2020-02-02 ENCOUNTER — Other Ambulatory Visit: Payer: Self-pay | Admitting: Advanced Practice Midwife

## 2020-02-02 ENCOUNTER — Encounter (HOSPITAL_COMMUNITY): Payer: Self-pay | Admitting: *Deleted

## 2020-02-02 ENCOUNTER — Other Ambulatory Visit: Payer: Self-pay

## 2020-02-02 ENCOUNTER — Telehealth (HOSPITAL_COMMUNITY): Payer: Self-pay | Admitting: *Deleted

## 2020-02-02 ENCOUNTER — Encounter: Payer: Self-pay | Admitting: Family Medicine

## 2020-02-02 VITALS — BP 109/72

## 2020-02-02 DIAGNOSIS — Z3480 Encounter for supervision of other normal pregnancy, unspecified trimester: Secondary | ICD-10-CM

## 2020-02-02 DIAGNOSIS — Z1501 Genetic susceptibility to malignant neoplasm of breast: Secondary | ICD-10-CM | POA: Diagnosis not present

## 2020-02-02 DIAGNOSIS — O09299 Supervision of pregnancy with other poor reproductive or obstetric history, unspecified trimester: Secondary | ICD-10-CM

## 2020-02-02 DIAGNOSIS — N96 Recurrent pregnancy loss: Secondary | ICD-10-CM

## 2020-02-02 DIAGNOSIS — Z3A38 38 weeks gestation of pregnancy: Secondary | ICD-10-CM | POA: Diagnosis not present

## 2020-02-02 DIAGNOSIS — Z1589 Genetic susceptibility to other disease: Secondary | ICD-10-CM

## 2020-02-02 DIAGNOSIS — O4692 Antepartum hemorrhage, unspecified, second trimester: Secondary | ICD-10-CM

## 2020-02-02 NOTE — Progress Notes (Signed)
I connected with@ on 02/02/20 at  2:15 PM EDT by: Mychart and verified that I am speaking with the correct person using two identifiers.  Patient is located at Home and provider is located at [facility].     The purpose of this virtual visit is to provide medical care while limiting exposure to the novel coronavirus. I discussed the limitations, risks, security and privacy concerns of performing an evaluation and management service by Mychart and the availability of in person appointments. I also discussed with the patient that there may be a patient responsible charge related to this service. By engaging in this virtual visit, you consent to the provision of healthcare.  Additionally, you authorize for your insurance to be billed for the services provided during this visit.  The patient expressed understanding and agreed to proceed.  The following staff members participated in the virtual visit:  Dominique Oyster RN    PRENATAL VISIT NOTE  Subjective:  Dominique Cannon is a 34 y.o. [phone] at [redacted]w[redacted]d for phone visit for ongoing prenatal care.  She is currently monitored for the following issues for this high-risk pregnancy and has Family history of breast cancer; Family history of ovarian cancer; Genetic testing; BRCA2 positive; History of recurrent miscarriages; Supervision of other normal pregnancy, antepartum; Vaginal bleeding in pregnancy, second trimester; Subchorionic hematoma; and History of shoulder dystocia in prior pregnancy, currently pregnant on their problem list.  Patient reports no complaints.  Contractions: Not present. Vag. Bleeding: None.  Movement: Present. Denies leaking of fluid.   The following portions of the patient's history were reviewed and updated as appropriate: allergies, current medications, past family history, past medical history, past social history, past surgical history and problem list.   Objective:   Vitals:   02/02/20 1423  BP: 109/72   Self-Obtained  Fetal  Status:     Movement: Present     Assessment and Plan:  Pregnancy: G4P1021 at 356w1d1. Vaginal bleeding in pregnancy, second trimester Not a currently having  2. History of shoulder dystocia in prior pregnancy, currently pregnant IOl at 39 wks desired  3. Supervision of other normal pregnancy, antepartum Up to date currently Partner is having 2nd dose of COVID vaccine on 3/28 and she needs to reschedule her IOL.  Will work on rescheduling IOL for 3/30  4. History of recurrent miscarriages   5. BRCA2 positive   Term labor symptoms and general obstetric precautions including but not limited to vaginal bleeding, contractions, leaking of fluid and fetal movement were reviewed in detail with the patient.  No follow-ups on file.  Future Appointments  Date Time Provider DeColumb[place]3/26/2021  2:30 PM MC-SCREENING MC-SDSC None  02/08/2020 12:00 AM MC-LD SCHED ROOM MC-INDC None  03/04/2020  1:30 PM Magrinat, [facility]Virgie Dad[facility]     Time spent on virtual visit: 18 minutes  KiCaren MacadamMD

## 2020-02-02 NOTE — Progress Notes (Signed)
I connected with  Dominique Cannon on 02/02/20 at  2:15 PM EDT by telephone and verified that I am speaking with the correct person using two identifiers.   I discussed the limitations, risks, security and privacy concerns of performing an evaluation and management service by telephone and the availability of in person appointments. I also discussed with the patient that there may be a patient responsible charge related to this service. The patient expressed understanding and agreed to proceed.  Scheryl Marten, RN 02/02/2020  2:24 PM

## 2020-02-02 NOTE — Telephone Encounter (Signed)
Preadmission screen  

## 2020-02-04 ENCOUNTER — Other Ambulatory Visit: Payer: Self-pay | Admitting: Advanced Practice Midwife

## 2020-02-05 ENCOUNTER — Other Ambulatory Visit: Payer: Self-pay

## 2020-02-05 ENCOUNTER — Telehealth: Payer: Self-pay | Admitting: *Deleted

## 2020-02-05 ENCOUNTER — Inpatient Hospital Stay (HOSPITAL_COMMUNITY)
Admission: AD | Admit: 2020-02-05 | Discharge: 2020-02-06 | DRG: 807 | Disposition: A | Discharge status: Home / Self Care | Payer: BC Managed Care – PPO | Attending: Obstetrics & Gynecology | Admitting: Obstetrics & Gynecology

## 2020-02-05 ENCOUNTER — Telehealth (HOSPITAL_COMMUNITY): Payer: Self-pay | Admitting: *Deleted

## 2020-02-05 ENCOUNTER — Encounter (HOSPITAL_COMMUNITY): Payer: Self-pay | Admitting: Family Medicine

## 2020-02-05 DIAGNOSIS — Z20822 Contact with and (suspected) exposure to covid-19: Secondary | ICD-10-CM | POA: Diagnosis present

## 2020-02-05 DIAGNOSIS — Z3A38 38 weeks gestation of pregnancy: Secondary | ICD-10-CM

## 2020-02-05 DIAGNOSIS — O26893 Other specified pregnancy related conditions, third trimester: Secondary | ICD-10-CM | POA: Diagnosis present

## 2020-02-05 DIAGNOSIS — O4692 Antepartum hemorrhage, unspecified, second trimester: Secondary | ICD-10-CM

## 2020-02-05 LAB — RESPIRATORY PANEL BY RT PCR (FLU A&B, COVID)
Influenza A by PCR: NEGATIVE
Influenza B by PCR: NEGATIVE
SARS Coronavirus 2 by RT PCR: NEGATIVE

## 2020-02-05 LAB — CBC
HCT: 35.1 % — ABNORMAL LOW (ref 36.0–46.0)
Hemoglobin: 11.1 g/dL — ABNORMAL LOW (ref 12.0–15.0)
MCH: 25.6 pg — ABNORMAL LOW (ref 26.0–34.0)
MCHC: 31.6 g/dL (ref 30.0–36.0)
MCV: 80.9 fL (ref 80.0–100.0)
Platelets: 298 10*3/uL (ref 150–400)
RBC: 4.34 MIL/uL (ref 3.87–5.11)
RDW: 13.3 % (ref 11.5–15.5)
WBC: 10.1 10*3/uL (ref 4.0–10.5)
nRBC: 0 % (ref 0.0–0.2)

## 2020-02-05 LAB — ABO/RH: ABO/RH(D): A POS

## 2020-02-05 LAB — TYPE AND SCREEN
ABO/RH(D): A POS
Antibody Screen: NEGATIVE

## 2020-02-05 MED ORDER — OXYCODONE-ACETAMINOPHEN 5-325 MG PO TABS: 2.0000 | ORAL_TABLET | ORAL | Status: DC | PRN

## 2020-02-05 MED ORDER — OXYCODONE-ACETAMINOPHEN 5-325 MG PO TABS: 1.0000 | ORAL_TABLET | ORAL | Status: DC | PRN

## 2020-02-05 MED ORDER — SOD CITRATE-CITRIC ACID 500-334 MG/5ML PO SOLN: 30.0000 mL | ORAL | Status: DC | PRN

## 2020-02-05 MED ORDER — SENNOSIDES-DOCUSATE SODIUM 8.6-50 MG PO TABS
2.0000 | ORAL_TABLET | ORAL | Status: DC
  Administered 2020-02-05: 2 via ORAL
  Filled 2020-02-05: qty 2

## 2020-02-05 MED ORDER — TETANUS-DIPHTH-ACELL PERTUSSIS 5-2.5-18.5 LF-MCG/0.5 IM SUSP: 0.5000 mL | Freq: Once | INTRAMUSCULAR | Status: DC

## 2020-02-05 MED ORDER — ACETAMINOPHEN 325 MG PO TABS: 650.0000 mg | ORAL_TABLET | ORAL | Status: DC | PRN

## 2020-02-05 MED ORDER — IBUPROFEN 600 MG PO TABS
600.0000 mg | ORAL_TABLET | Freq: Four times a day (QID) | ORAL | Status: DC | PRN
  Administered 2020-02-05: 600 mg via ORAL
  Filled 2020-02-05: qty 1

## 2020-02-05 MED ORDER — OXYTOCIN 40 UNITS IN NORMAL SALINE INFUSION - SIMPLE MED
2.5000 [IU]/h | INTRAVENOUS | Status: DC
  Filled 2020-02-05: qty 1000

## 2020-02-05 MED ORDER — ONDANSETRON HCL 4 MG/2ML IJ SOLN: 4.0000 mg | Freq: Four times a day (QID) | INTRAMUSCULAR | Status: DC | PRN

## 2020-02-05 MED ORDER — DIBUCAINE (PERIANAL) 1 % EX OINT: 1.0000 "application " | TOPICAL_OINTMENT | CUTANEOUS | Status: DC | PRN

## 2020-02-05 MED ORDER — BENZOCAINE-MENTHOL 20-0.5 % EX AERO
1.0000 "application " | INHALATION_SPRAY | CUTANEOUS | Status: DC | PRN
  Administered 2020-02-06: 1 via TOPICAL
  Filled 2020-02-05: qty 56

## 2020-02-05 MED ORDER — WITCH HAZEL-GLYCERIN EX PADS: 1.0000 "application " | MEDICATED_PAD | CUTANEOUS | Status: DC | PRN

## 2020-02-05 MED ORDER — LACTATED RINGERS IV SOLN: INTRAVENOUS | Status: DC

## 2020-02-05 MED ORDER — LIDOCAINE HCL (PF) 1 % IJ SOLN
30.0000 mL | INTRAMUSCULAR | Status: DC | PRN
  Filled 2020-02-05: qty 30

## 2020-02-05 MED ORDER — LIDOCAINE HCL (PF) 1 % IJ SOLN: 30.0000 mL | INTRAMUSCULAR | Status: DC | PRN

## 2020-02-05 MED ORDER — LACTATED RINGERS IV SOLN: 500.0000 mL | INTRAVENOUS | Status: DC | PRN

## 2020-02-05 MED ORDER — OXYTOCIN BOLUS FROM INFUSION: 500.0000 mL | Freq: Once | INTRAVENOUS | Status: DC

## 2020-02-05 MED ORDER — OXYTOCIN 40 UNITS IN NORMAL SALINE INFUSION - SIMPLE MED: 2.5000 [IU]/h | INTRAVENOUS | Status: DC

## 2020-02-05 MED ORDER — IBUPROFEN 600 MG PO TABS
600.0000 mg | ORAL_TABLET | Freq: Four times a day (QID) | ORAL | Status: DC
  Administered 2020-02-05 – 2020-02-06 (×3): 600 mg via ORAL
  Filled 2020-02-05 (×4): qty 1

## 2020-02-05 MED ORDER — COCONUT OIL OIL: 1.0000 "application " | TOPICAL_OIL | Status: DC | PRN

## 2020-02-05 MED ORDER — PRENATAL MULTIVITAMIN CH
1.0000 | ORAL_TABLET | Freq: Every day | ORAL | Status: DC
  Administered 2020-02-06: 1 via ORAL
  Filled 2020-02-05: qty 1

## 2020-02-05 MED ORDER — OXYTOCIN BOLUS FROM INFUSION
500.0000 mL | Freq: Once | INTRAVENOUS | Status: AC
  Administered 2020-02-05: 500 mL via INTRAVENOUS

## 2020-02-05 MED ORDER — LIDOCAINE HCL (PF) 1 % IJ SOLN
INTRAMUSCULAR | Status: AC
  Filled 2020-02-05: qty 30

## 2020-02-05 MED ORDER — ONDANSETRON HCL 4 MG PO TABS: 4.0000 mg | ORAL_TABLET | ORAL | Status: DC | PRN

## 2020-02-05 MED ORDER — ONDANSETRON HCL 4 MG/2ML IJ SOLN: 4.0000 mg | INTRAMUSCULAR | Status: DC | PRN

## 2020-02-05 MED ORDER — DIPHENHYDRAMINE HCL 25 MG PO CAPS: 25.0000 mg | ORAL_CAPSULE | Freq: Four times a day (QID) | ORAL | Status: DC | PRN

## 2020-02-05 MED ORDER — FLEET ENEMA 7-19 GM/118ML RE ENEM: 1.0000 | ENEMA | RECTAL | Status: DC | PRN

## 2020-02-05 MED ORDER — SIMETHICONE 80 MG PO CHEW: 80.0000 mg | CHEWABLE_TABLET | ORAL | Status: DC | PRN

## 2020-02-05 NOTE — Telephone Encounter (Signed)
Pt had husband call to let us know that she has been contracting since 4am and they are getting loser together and more uncomfortable, Denies any LOF, VB, and good fetal movement. Pt did have some nausea and vomiting yesterday that did get better. Informed husband to go ahead and take her to [facility] for a labor check.

## 2020-02-05 NOTE — Telephone Encounter (Signed)
Updated to new covid test date.  Pt coming in to MAU now for uCs.

## 2020-02-05 NOTE — H&P (Addendum)
OBSTETRIC ADMISSION HISTORY AND PHYSICAL  Dominique Cannon is a 34 y.o. female [phone] with IUP at 34w4dby LMP presenting for SOL. She reports +FMs, No LOF, no VB, no blurry vision, headaches or peripheral edema, and RUQ pain.  She plans on breast feeding. She request outpatient IUD for birth control. [facility]   Dating: By LMP c/w 12 week u/s --->  Estimated Date of Delivery: 02/15/20  Sono:    '@[redacted]w[redacted]d'$ , CWD, normal anatomy, cephalic presentation, 27591M 23% EFW   Prenatal History/Complications: Hx shoulder dystocia Hx recurrent miscarriage   Past Medical History: Past Medical History:  Diagnosis Date   BRCA2 positive    Family history of breast cancer in mother    mother age 6969died of breast cancer   Family history of ovarian cancer     Past Surgical History: Past Surgical History:  Procedure Laterality Date   DILATION AND EVACUATION N/A 01/16/2017   Procedure: DILATATION AND EVACUATION;  Surgeon: MMegan Salon MD;  [facility];  Service: Gynecology;  Laterality: N/A;   WISDOM TOOTH EXTRACTION      Obstetrical History: OB History     Gravida  4   Para  1   Term  1   Preterm  0   AB  2   Living  1      SAB  2   TAB  0   Ectopic  0   Multiple  0   Live Births  1           Social History Social History   Socioeconomic History   Marital status: Married    Spouse name: Not on file   Number of children: Not on file   Years of education: Not on file   Highest education level: Not on file  Occupational History   Not on file  Tobacco Use   Smoking status: Never Smoker   Smokeless tobacco: Never Used  Substance and Sexual Activity   Alcohol use: Not Currently   Drug use: No   Sexual activity: Yes    Partners: Male    Birth control/protection: None  Other Topics Concern   Not on file  Social History Narrative   Not on file   Social Determinants of Health   Financial Resource Strain:    Difficulty of Paying  Living Expenses:   Food Insecurity:    Worried About RCharity fundraiserin the Last Year:    RArboriculturistin the Last Year:   Transportation Needs:    LFilm/video editor(Medical):    Lack of Transportation (Non-Medical):   Physical Activity:    Days of Exercise per Week:    Minutes of Exercise per Session:   Stress:    Feeling of Stress :   Social Connections:    Frequency of Communication with Friends and Family:    Frequency of Social Gatherings with Friends and Family:    Attends Religious Services:    Active Member of Clubs or Organizations:    Attends CMusic therapist    Marital Status:     Family History: Family History  Problem Relation Age of Onset   Breast cancer Mother 352  Ovarian cancer Maternal Grandmother        dx in her 682s  Breast cancer Paternal Grandmother    Cancer Maternal Uncle        NOS   Other Other  positive genetic testing for "the gene"   Breast cancer Other        MGF's sister   Breast cancer Other        MGF's mother    Allergies: No Known Allergies  Medications Prior to Admission  Medication Sig Dispense Refill Last Dose   Misc. Devices (BREAST PUMP) MISC 1 Units by Does not apply route as needed. (Patient not taking: Reported on 10/29/2019) 1 each 0    Prenatal Vit-Fe Fumarate-FA (PRENATAL VITAMIN PO) Take 1 tablet by mouth daily.         Review of Systems   All systems reviewed and negative except as stated in HPI  Blood pressure 120/85, pulse 97, temperature (!) 97.5 F (36.4 C), temperature source Oral, resp. rate 18, height '5\' 8"'$  (1.727 m), weight 73.1 kg, last menstrual period 05/11/2019, currently breastfeeding. General appearance: alert, cooperative and no distress Abdomen: soft, non-tender; bowel sounds normal Extremities: Homans sign is negative, no sign of DVT Presentation: cephalic Fetal monitoringBaseline: 150 bpm, Variability: Good {> 6 bpm) and Accelerations: Reactive Uterine  activity Every 3-5 mins Dilation: 8 Effacement (%): 100 Station: 0 Exam by:: TLYTLE RN   Prenatal labs: ABO, Rh: A/Positive/-- (09/22 1353) Antibody: Negative (09/22 1353) Rubella: 3.60 (09/22 1353) RPR: Non Reactive (01/19 0831)  HBsAg: Negative (09/22 1353)  HIV: Non Reactive (01/19 0831)  GBS: Negative/-- (03/09 1600)  2 hr Glucola wnl Genetic screening  Quad screen neg Anatomy US Normal  Prenatal Transfer Tool  Maternal Diabetes: No Genetic Screening: Normal Maternal Ultrasounds/Referrals: Normal Fetal Ultrasounds or other Referrals:  None Maternal Substance Abuse:  No Significant Maternal Medications:  None Significant Maternal Lab Results: Group B Strep negative  No results found for this or any previous visit (from the past 24 hour(s)).  Patient Active Problem List   Diagnosis Date Noted   History of shoulder dystocia in prior pregnancy, currently pregnant 12/16/2019   Subchorionic hematoma 09/23/2019   Vaginal bleeding in pregnancy, second trimester 09/02/2019   History of recurrent miscarriages 12/26/2017   Supervision of other normal pregnancy, antepartum 12/26/2017   Genetic testing 09/27/2015   BRCA2 positive 09/27/2015   Family history of breast cancer    Family history of ovarian cancer     Assessment/Plan:  Dominique Cannon is a 34 y.o. G4P1021 at 19w4dhere for SOL.  #Labor: Expectant management #Pain: Requests no epidural, wants to attempt natural delivery #FWB: Cat I #ID: GBS neg. #MOF: Breast #MOC:Outpt [place] #Circ:  Yes  RLurline Del DO  02/05/2020, 12:29 PM  Attestation of Supervision of Student:  I confirm that I have verified the information documented in the  resident's  note and that I have also personally reperformed the history, physical exam and all medical decision making activities.  I have verified that all services and findings are accurately documented in this note and I agree with management and plan as outlined in the  documentation. I have also made any necessary editorial changes.  --8cm with strong urge to push on arrival to L&D --Plan for AROM when labs collected --Room prepped for shoulder maneuvers due to history of same --Anticipate NSVD  SDarlina Rumpf CNM Attending [place] [facility] CMon[place]3/25/2021 2:21 PM

## 2020-02-05 NOTE — Discharge Summary (Addendum)
Postpartum Discharge Summary     Patient Name: Dominique Cannon DOB: [birth year] MRN: [id number]  Date of admission: 02/05/2020 Delivering Provider: Lurline Del   Date of discharge: 02/06/2020  Admitting diagnosis: Normal labor [O80, Z37.9] History of shoulder dystocia in prior pregnancy, currently pregnant [O09.299] Intrauterine pregnancy: [redacted]w[redacted]d    Secondary diagnosis:  Active Problems:   Spontaneous vaginal delivery  Additional problems: None     Discharge diagnosis: Term Pregnancy Delivered                                                                                                Post partum procedures: N/A  Augmentation: AROM  Complications: None  Hospital course:  Onset of Labor With Vaginal Delivery     34y.o. yo [id number] 32w4das admitted in Active Labor on 02/05/2020. Patient had an uncomplicated labor course as follows:  Membrane Rupture Time/Date: 12:56 PM ,02/05/2020   Intrapartum Procedures: Episiotomy: None [1]                                         Lacerations:  1st degree [2];Perineal [11]  Patient had a delivery of a Viable infant. 02/05/2020  Information for the patient's newborn:  LeVonnie, Spagnolo0[[id number]]Delivery Method: Vaginal, Spontaneous(Filed from Delivery Summary)     Pateint had an uncomplicated postpartum course.  She is ambulating, tolerating a regular diet, passing flatus, and urinating well. Patient is discharged home in stable condition on 02/06/20.  Delivery time: 1:41 PM    Magnesium Sulfate received: No BMZ received: No Rhophylac:N/A MMR:N/A Transfusion:No  Physical exam  Vitals:   02/05/20 1800 02/05/20 2157 02/06/20 0148 02/06/20 0600  BP: 130/84 112/74 104/69 108/74  Pulse: 80 95 78 72  Resp:  _0 Temp: 97.7 F (36.5 C) 98.1 F (36.7 C) 97.8 F (36.6 C) 98.1 F (36.7 C)  TempSrc: Oral     SpO2:  98% 98% 99%  Weight:      Height:       Exam by Dr. FaMarice PotterGeneral: alert, cooperative and no  distress Lochia: appropriate Uterine Fundus: firm Incision: N/A DVT Evaluation: No evidence of DVT seen on physical exam.  Labs: Lab Results  Component Value Date   WBC 12.1 (H) 02/06/2020   HGB 9.3 (L) 02/06/2020   HCT 30.0 (L) 02/06/2020   MCV 82.9 02/06/2020   PLT 287 02/06/2020   CMP Latest Ref Rng & Units 08/04/2019  Glucose 70 - 99 mg/dL 105(H)  BUN 6 - 20 mg/dL 9  Creatinine 0.44 - 1.00 mg/dL 0.52  Sodium 135 - 145 mmol/L 136  Potassium 3.5 - 5.1 mmol/L 3.8  Chloride 98 - 111 mmol/L 104  CO2 22 - 32 mmol/L 24  Calcium 8.9 - 10.3 mg/dL 9.4  Total Protein 6.5 - 8.1 g/dL 7.0  Total Bilirubin 0.3 - 1.2 mg/dL 0.5  Alkaline Phos 38 - 126 U/L 45  AST 15 - 41 U/L 16  ALT 0 - 44  U/L 12   Edinburgh Score: Edinburgh Postnatal Depression Scale Screening Tool 02/06/2020  I have been able to laugh and see the funny side of things. 0  I have looked forward with enjoyment to things. 0  I have blamed myself unnecessarily when things went wrong. 1  I have been anxious or worried for no good reason. 0  I have felt scared or panicky for no good reason. 0  Things have been getting on top of me. 0  I have been so unhappy that I have had difficulty sleeping. 0  I have felt sad or miserable. 0  I have been so unhappy that I have been crying. 0  The thought of harming myself has occurred to me. 0  Edinburgh Postnatal Depression Scale Total 1    Discharge instruction: per After Visit Summary and "Baby and Me Booklet".  After visit meds:  Allergies as of 02/06/2020   No Known Allergies      Medication List     TAKE these medications    acetaminophen 325 MG tablet Commonly known as: Tylenol Take 2 tablets (650 mg total) by mouth every 4 (four) hours as needed for mild pain.   Breast Pump Misc 1 Units by Does not apply route as needed.   ferrous sulfate 325 (65 FE) MG tablet Take 1 tablet (325 mg total) by mouth every other day. Start taking on: February 08, 2020   ibuprofen  600 MG tablet Commonly known as: ADVIL Take 1 tablet (600 mg total) by mouth every 6 (six) hours.   PRENATAL VITAMIN PO Take 1 tablet by mouth daily.        Diet: routine diet  Activity: Advance as tolerated. Pelvic rest for 6 weeks.   Outpatient follow up:4 weeks Follow up Appt: Future Appointments  Date Time Provider [place]  02/09/2020  9:20 AM MC-SCREENING MC-SDSC None  03/04/2020  1:30 PM Magrinat, Virgie Dad, MD [facility]MEDONC None  03/10/2020 10:00 AM Darlina Rumpf, CNM [facility]WSCA [facility]   Follow up Visit:     Please schedule this patient for Postpartum visit in: 4 weeks with the following provider: Any provider In-Person For C/S patients schedule nurse incision check in weeks 2 weeks: no Low risk pregnancy complicated by:  N/A Delivery mode:  SVD Anticipated Birth Control:  IUD PP Procedures needed:  IUD placement   Schedule Integrated [place] visit: no     Newborn Data: Live born female  Birth Weight: 3246 g  APGAR: 7, 9   Newborn Delivery   Birth date/time: 02/05/2020 13:41:00 Delivery type: Vaginal, Spontaneous      Baby Feeding: Bottle and Breast Disposition:home with mother   02/06/2020 Lurline Del, DO  GME ATTESTATION:  I saw and evaluated the patient. I agree with the findings and the plan of care as documented in the resident's note.  Merilyn Baba, DO OB Fellow, [place] [facility] [place] 02/06/2020 3:46 PM

## 2020-02-05 NOTE — Lactation Note (Signed)
This note was copied from a baby's chart. Lactation Consultation Note  Patient Name: Dominique Cannon [id number] Date: 02/05/2020 Reason for consult: Initial assessment;Early term 11-38.6wks  P2 mother whose infant is now 26 hours old.  This is an ETI at 38+4 weeks.  Mother breast fed her first child (now 82 months old) for one year.  She hopes to breast feed this baby for a year also.  Baby was asleep STS on mother's chest when I arrived.  Mother informed me that he has latched and fed well one time since delivery.  Encouraged her to feed 8-12 times/24 hours or sooner if he shows feeding cues.  Reviewed cues.  Mother is familiar with hand expression.  Colostrum container provided and milk storage times discussed.  Finger feeding demonstrated.  Due to baby being an ETI I suggested mother awaken him every three hours if he does not self awaken.  Encouraged her to call for latch assistance as needed.  Mom made aware of O/P services, breastfeeding support groups, community resources, and our phone # for post-discharge questions.  Mother has a DEBP for home use.  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  No support person here at this time.     Maternal Data Formula Feeding for Exclusion: No Has patient been taught Hand Expression?: Yes Does the patient have breastfeeding experience prior to this delivery?: Yes  Feeding    LATCH Score                   Interventions    Lactation Tools Discussed/Used WIC Program: No   Consult Status Consult Status: Follow-up Date: 02/06/20 Follow-up type: In-patient    Hayslee Casebolt R Halo Laski 02/05/2020, 7:07 PM

## 2020-02-05 NOTE — Discharge Instructions (Signed)

## 2020-02-05 NOTE — MAU Note (Signed)
PT has been contracting since 0400. 3-5 min apart. Denies LOF or VB. +FM

## 2020-02-06 ENCOUNTER — Other Ambulatory Visit (HOSPITAL_COMMUNITY): Admission: RE | Admit: 2020-02-06 | Payer: BC Managed Care – PPO | Source: Ambulatory Visit

## 2020-02-06 ENCOUNTER — Other Ambulatory Visit (HOSPITAL_COMMUNITY): Payer: BC Managed Care – PPO

## 2020-02-06 LAB — CBC
HCT: 30 % — ABNORMAL LOW (ref 36.0–46.0)
Hemoglobin: 9.3 g/dL — ABNORMAL LOW (ref 12.0–15.0)
MCH: 25.7 pg — ABNORMAL LOW (ref 26.0–34.0)
MCHC: 31 g/dL (ref 30.0–36.0)
MCV: 82.9 fL (ref 80.0–100.0)
Platelets: 287 10*3/uL (ref 150–400)
RBC: 3.62 MIL/uL — ABNORMAL LOW (ref 3.87–5.11)
RDW: 13.5 % (ref 11.5–15.5)
WBC: 12.1 10*3/uL — ABNORMAL HIGH (ref 4.0–10.5)
nRBC: 0 % (ref 0.0–0.2)

## 2020-02-06 LAB — RPR: RPR Ser Ql: NONREACTIVE

## 2020-02-06 MED ORDER — IBUPROFEN 600 MG PO TABS: 600.0000 mg | ORAL_TABLET | Freq: Four times a day (QID) | ORAL | 0 refills | Status: DC

## 2020-02-06 MED ORDER — FERROUS SULFATE 325 (65 FE) MG PO TABS
325.0000 mg | ORAL_TABLET | ORAL | Status: DC
  Administered 2020-02-06: 325 mg via ORAL
  Filled 2020-02-06: qty 1

## 2020-02-06 MED ORDER — FERROUS SULFATE 325 (65 FE) MG PO TABS: 325.0000 mg | ORAL_TABLET | ORAL | 2 refills | Status: DC

## 2020-02-06 MED ORDER — ACETAMINOPHEN 325 MG PO TABS: 650.0000 mg | ORAL_TABLET | ORAL | 0 refills | Status: DC | PRN

## 2020-02-06 NOTE — Progress Notes (Addendum)
POSTPARTUM PROGRESS NOTE  Post Partum Day 1  Subjective:  Dominique Cannon is a 34 y.o. [id number] s/p SVD at [redacted]w[redacted]d.  She reports she is doing well. No acute events overnight. She denies any problems with ambulating, voiding or po intake. Denies nausea or vomiting.  Pain is well controlled.  Lochia is appropriate and improving.  Objective: Blood pressure 108/74, pulse 72, temperature 98.1 F (36.7 C), resp. rate 18, height 5\' 8"  (1.727 m), weight 73.1 kg, last menstrual period 05/11/2019, SpO2 99 %, unknown if currently breastfeeding.  Physical Exam:  General: alert, cooperative and no distress Chest: no respiratory distress Heart:regular rate, distal pulses intact Abdomen: soft, nontender Uterine Fundus: firm, appropriately tender DVT Evaluation: No calf swelling or tenderness Extremities: no edema Skin: warm, dry  Recent Labs    02/05/20 1254 02/06/20 0446  HGB 11.1* 9.3*  HCT 35.1* 30.0*    Assessment/Plan: Dominique Cannon is a 34 y.o. 34 s/p SVD at [redacted]w[redacted]d   PPD#1 - Doing well. Continue routine postpartum care.  Contraception: outpatient IUD Feeding: breast Dispo: Plan for discharge this afternoon as long as baby is discharged.   LOS: 1 day    [redacted]w[redacted]d MD, PGY-1 OBGYN Faculty Teaching Service  02/06/2020, 6:04 AM   I saw and evaluated the patient. I agree with the findings and the plan of care as documented in the resident's note. Starting PO iron. Okay to discharge today if baby can; RN to page team for orders. Circ done this AM.   02/08/2020, MD [facility] Family Medicine Fellow, [facility] for [facility], [facility]

## 2020-02-08 ENCOUNTER — Inpatient Hospital Stay (HOSPITAL_COMMUNITY)
Admission: RE | Admit: 2020-02-08 | Payer: BC Managed Care – PPO | Source: Ambulatory Visit | Admitting: Obstetrics & Gynecology

## 2020-02-09 ENCOUNTER — Other Ambulatory Visit (HOSPITAL_COMMUNITY): Admission: RE | Admit: 2020-02-09 | Payer: BC Managed Care – PPO | Source: Ambulatory Visit

## 2020-02-10 ENCOUNTER — Inpatient Hospital Stay (HOSPITAL_COMMUNITY): Payer: BC Managed Care – PPO

## 2020-02-10 ENCOUNTER — Inpatient Hospital Stay (HOSPITAL_COMMUNITY)
Admission: RE | Admit: 2020-02-10 | Payer: BC Managed Care – PPO | Source: Home / Self Care | Admitting: Obstetrics & Gynecology

## 2020-02-19 ENCOUNTER — Ambulatory Visit: Payer: BC Managed Care – PPO

## 2020-02-19 ENCOUNTER — Ambulatory Visit: Payer: BC Managed Care – PPO | Attending: Internal Medicine

## 2020-02-19 DIAGNOSIS — Z23 Encounter for immunization: Secondary | ICD-10-CM

## 2020-02-19 NOTE — Progress Notes (Signed)
   Covid-19 Vaccination Clinic  Name:  Dominique Cannon    MRN: [id number] DOB: 13-Sep-1986  02/19/2020  [name] was observed post Covid-19 immunization for 15 minutes without incident. She was provided with Vaccine Information Sheet and instruction to access the V-Safe system.   [name] was instructed to call 911 with any severe reactions post vaccine: Marland Kitchen Difficulty breathing  . Swelling of face and throat  . A fast heartbeat  . A bad rash all over body  . Dizziness and weakness   Immunizations Administered    Name Date Dose VIS Date Route   Pfizer COVID-19 Vaccine 02/19/2020 10:53 AM 0.3 mL 10/24/2019 Intramuscular   Manufacturer: [employer], [employer]   Lot: [id number]   NDC: [address]-8

## 2020-03-02 NOTE — Progress Notes (Signed)
[place]  [phone] [phone]    The following is a corrected copy of the original 10/30/2016 dictation   [name] DOB: 10-06-1986  MR#: [id number]  YQI#:[id number]  Patient Care Team: Earlie Raveling, NP as PCP - General (Nurse Practitioner) Brennyn Haisley, Virgie Dad, MD as Consulting Physician (Oncology) Megan Salon, MD as Consulting Physician (Gynecology) Fair, Marin Shutter, MD as Consulting Physician (Family Medicine)  OTHER MD:  CHIEF COMPLAINT: BRCA 2 positivity  CURRENT TREATMENT: Intensified screening   INTERVAL HISTORY: Aissata returns today for follow-up of her BRCA positivity.   She gave birth to a healthy baby boy on 02/05/2020. Marjory Lies came at 38 weeks and weighed 7-1/4 pounds.  He is nursing successfully of course not yet sleeping through the night  She was to have started intensified screening, but further MRIs and mammography have been held due to pregnancy and breast feeding.   She received her first Covid-19 vaccine dose on 02/19/2020.  She is due for the second [place] dose on 03/14/2020   REVIEW OF SYSTEMS: Ferrin did well with her pregnancy, without unusual pain bleeding fever or other complications.  She is on maternity leave at present and this will carry her through the end of the semester so she will not be back to work until the fall.  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  For exercise she is walking with a stroller about 20 minutes twice a day on good days.  Her periods have not yet resumed.  She will have a copper IUD placed next week by her OB MD.  She will be following with Dr. Edwinna Areola within the next 2 months.   HISTORY OF PRESENT ILLNESS: From the original intake note:  Tameya has a significant family history suggesting a hereditary breast cancer syndrome and her gynecologist [name] working with Dr. [name] referred her for genetic evaluation. 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 The other 19 genes tested were unremarkable.  Her subsequent history is as detailed below   PAST MEDICAL HISTORY: Past Medical History:  Diagnosis Date  . BRCA2 positive   . Family history of breast cancer in mother    mother age 67 died of breast cancer  . Family history of ovarian cancer     PAST SURGICAL HISTORY: Past Surgical History:  Procedure Laterality Date  . DILATION AND EVACUATION N/A 01/16/2017   Procedure: DILATATION AND EVACUATION;  Surgeon: Megan Salon, MD;  Location: [facility];  Service: Gynecology;  Laterality: N/A;  . WISDOM TOOTH EXTRACTION      FAMILY HISTORY Family History  Problem Relation Age of Onset  . Breast cancer Mother 9  . Ovarian cancer Maternal Grandmother        dx in her 51s  . Breast cancer Paternal Grandmother   . Cancer Maternal Uncle        NOS  . Other Other        positive genetic testing for "the gene"  . Breast cancer Other        MGF's sister  . Breast cancer Other        MGF's mother   The patient's mother died at the age of 14 from breast cancer. The patient's mother had one sister, in good health. The patient's mother's mother has a history of ovarian cancer. One maternal great aunt also had a history  of breast cancer. A second has tested positive for a gene mutation, but we do not have those records. On the paternal side there is also a history of breast cancer, likely postmenopausal   GYNECOLOGIC HISTORY:  No LMP recorded. Menarche age 55, the patient is [place] P2. She used a Mirena IUD for contraception.    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 Her husband, Dian Situ, is also a Art therapist, and band leader. Silvanna gave birth to her son, [name], on 07/24/2018 and her second son Marjory Lies on 02/05/2020.  They have not made any definite  decisions regarding further family growth.    ADVANCED DIRECTIVES: In the absence of any documentation to the contrary, the patient's spouse is their HCPOA.    HEALTH MAINTENANCE: Social History   Tobacco Use  . Smoking status: Never Smoker  . Smokeless tobacco: Never Used  Substance Use Topics  . Alcohol use: Not Currently  . Drug use: No     Colonoscopy: n/a (age)  PAP:  Bone density: n/a (age)  No Known Allergies  Current Outpatient Medications  Medication Sig Dispense Refill  . acetaminophen (TYLENOL) 325 MG tablet Take 2 tablets (650 mg total) by mouth every 4 (four) hours as needed for mild pain. 30 tablet 0  . ferrous sulfate 325 (65 FE) MG tablet Take 1 tablet (325 mg total) by mouth every other day. 15 tablet 2  . ibuprofen (ADVIL) 600 MG tablet Take 1 tablet (600 mg total) by mouth every 6 (six) hours. 30 tablet 0  . Misc. Devices (BREAST PUMP) MISC 1 Units by Does not apply route as needed. (Patient not taking: Reported on 10/29/2019) 1 each 0  . Prenatal Vit-Fe Fumarate-FA (PRENATAL VITAMIN PO) Take 1 tablet by mouth daily.      No current facility-administered medications for this visit.    OBJECTIVE: white woman in no acute distress  Vitals:   03/03/20 0850  BP: 114/75  Pulse: 79  Resp: 20  Temp: 98.2 F (36.8 C)  SpO2: 100%   Wt Readings from Last 3 Encounters:  03/03/20 146 lb 11.2 oz (66.5 kg)  02/05/20 161 lb 1.6 oz (73.1 kg)  01/28/20 164 lb 6.4 oz (74.6 kg)   Body mass index is 22.31 kg/m.    ECOG FS:1 - Symptomatic but completely ambulatory  Sclerae unicteric, EOMs intact Wearing a mask No cervical or supraclavicular adenopathy Lungs no rales or rhonchi Heart regular rate and rhythm Abd soft, nontender, positive bowel sounds MSK no focal spinal tenderness, no upper extremity lymphedema Neuro: nonfocal, well oriented, appropriate affect Breasts: Breasts show no palpable masses, no skin or nipple changes of concern.  Both axillae are  benign  LAB RESULTS:  CMP     Component Value Date/Time   NA 136 08/04/2019 1444   K 3.8 08/04/2019 1444   CL 104 08/04/2019 1444   CO2 24 08/04/2019 1444   GLUCOSE 105 (H) 08/04/2019 1444   BUN 9 08/04/2019 1444   CREATININE 0.52 08/04/2019 1444   CALCIUM 9.4 08/04/2019 1444   PROT 7.0 08/04/2019 1444   ALBUMIN 4.1 08/04/2019 1444   AST 16 08/04/2019 1444   ALT 12 08/04/2019 1444   ALKPHOS 45 08/04/2019 1444   BILITOT 0.5 08/04/2019 1444   GFRNONAA >60 08/04/2019 1444   GFRAA >60 08/04/2019 1444    INo results found for: SPEP, UPEP  Lab Results  Component Value Date   WBC  12.1 (H) 02/06/2020   NEUTROABS 5.9 08/05/2019   HGB 9.3 (L) 02/06/2020   HCT 30.0 (L) 02/06/2020   MCV 82.9 02/06/2020   PLT 287 02/06/2020      Chemistry      Component Value Date/Time   NA 136 08/04/2019 1444   K 3.8 08/04/2019 1444   CL 104 08/04/2019 1444   CO2 24 08/04/2019 1444   BUN 9 08/04/2019 1444   CREATININE 0.52 08/04/2019 1444      Component Value Date/Time   CALCIUM 9.4 08/04/2019 1444   ALKPHOS 45 08/04/2019 1444   AST 16 08/04/2019 1444   ALT 12 08/04/2019 1444   BILITOT 0.5 08/04/2019 1444       No results found for: LABCA2  No components found for: LABCA125  No results for input(s): INR in the last 168 hours.  Urinalysis No results found for: COLORURINE, APPEARANCEUR, LABSPEC, PHURINE, GLUCOSEU, HGBUR, BILIRUBINUR, KETONESUR, PROTEINUR, UROBILINOGEN, NITRITE, LEUKOCYTESUR  STUDIES: No results found.   ASSESSMENT: 34 y.o. BRCA 2 positive [place], [place] woman with brest density category D   (1) patient has decided against bilateral mastectomies and BSO, at least until child-bearing and nursing are complete  (2) patient opted against tamoxifen for risk reduction  (3) breast cancer screening: yearly mammogram and MRI, preferably 6 months apart to start at the end of breast-feeding, April 2022  (a) biannual breast exam   (4) ovarian cancer screening: pelvic  US every 6 months with CA 125   PLAN: Torianna did terrific with her pregnancy and she is nursing successfully.  Her plan is to nurse for a yearly as she did with her first pregnancy.  Her periods have not resumed as frequently they developed while nursing and she is scheduled for a copper IUD placement next week for contraception.  Mammography and nursing patient is really not very informative.  We could try to finesse it by having her nurse shortly before the mammogram but I really think in the absence of any change in either breast we are just as well off waiting until she completes her nursing.  Of course we could do an MRI but that is likely to show very nonspecific changes which we would then have to consider biopsying.  A better plan is for her to be closely followed by Dr. Sabra Heck her gynecologist.  She is being screened for ovarian cancer in any case and will have a breast exam every 6 months as a result.  Then when [place] stops nursing, about a year from now she will have her next mammography.  She will see me shortly after that and we will wait on the MRI until October of next year  She is comfortable with this plan as a my and she will let me know if any changes occur.  Total encounter time 25 minutes.*  Bernabe Dorce, Virgie Dad, MD  03/03/20 9:12 AM Medical Oncology and Hematology [facility] [place], [place] [address] Tel. [phone]    Fax. [phone]   I, Wilburn Mylar, am acting as scribe for Dr. Virgie Dad. Draxton Luu.  I, Lurline Del MD, have reviewed the above documentation for accuracy and completeness, and I agree with the above.    *Total Encounter Time as defined by the Centers for Medicare and Medicaid Services includes, in addition to the face-to-face time of a patient visit (documented in the note above) non-face-to-face time: obtaining and reviewing outside history, ordering and reviewing medications, tests or procedures, care  coordination (communications with other health care professionals or caregivers) and documentation in the medical record.

## 2020-03-03 ENCOUNTER — Inpatient Hospital Stay: Payer: BC Managed Care – PPO | Attending: Oncology | Admitting: Oncology

## 2020-03-03 ENCOUNTER — Other Ambulatory Visit: Payer: Self-pay

## 2020-03-03 VITALS — BP 114/75 | HR 79 | Temp 98.2°F | Resp 20 | Ht 68.0 in | Wt 146.7 lb

## 2020-03-03 DIAGNOSIS — Z1509 Genetic susceptibility to other malignant neoplasm: Secondary | ICD-10-CM | POA: Diagnosis not present

## 2020-03-03 DIAGNOSIS — Z791 Long term (current) use of non-steroidal anti-inflammatories (NSAID): Secondary | ICD-10-CM | POA: Diagnosis not present

## 2020-03-03 DIAGNOSIS — Z1239 Encounter for other screening for malignant neoplasm of breast: Secondary | ICD-10-CM

## 2020-03-03 DIAGNOSIS — Z803 Family history of malignant neoplasm of breast: Secondary | ICD-10-CM | POA: Diagnosis not present

## 2020-03-03 DIAGNOSIS — Z8041 Family history of malignant neoplasm of ovary: Secondary | ICD-10-CM | POA: Insufficient documentation

## 2020-03-03 DIAGNOSIS — Z1501 Genetic susceptibility to malignant neoplasm of breast: Secondary | ICD-10-CM | POA: Diagnosis present

## 2020-03-03 DIAGNOSIS — Z9189 Other specified personal risk factors, not elsewhere classified: Secondary | ICD-10-CM

## 2020-03-04 ENCOUNTER — Inpatient Hospital Stay: Payer: BC Managed Care – PPO | Admitting: Oncology

## 2020-03-04 ENCOUNTER — Telehealth: Payer: Self-pay | Admitting: Oncology

## 2020-03-04 NOTE — Telephone Encounter (Signed)
Scheduled appts per 4/21 los. Pt confirmed appt date and time.  

## 2020-03-10 ENCOUNTER — Ambulatory Visit (INDEPENDENT_AMBULATORY_CARE_PROVIDER_SITE_OTHER): Payer: BC Managed Care – PPO | Admitting: Advanced Practice Midwife

## 2020-03-10 ENCOUNTER — Other Ambulatory Visit: Payer: Self-pay

## 2020-03-10 ENCOUNTER — Encounter: Payer: Self-pay | Admitting: Advanced Practice Midwife

## 2020-03-10 VITALS — BP 89/51 | HR 87 | Ht 68.0 in | Wt 145.0 lb

## 2020-03-10 DIAGNOSIS — Z3202 Encounter for pregnancy test, result negative: Secondary | ICD-10-CM

## 2020-03-10 DIAGNOSIS — Z30430 Encounter for insertion of intrauterine contraceptive device: Secondary | ICD-10-CM | POA: Diagnosis not present

## 2020-03-10 LAB — POCT URINE PREGNANCY: Preg Test, Ur: NEGATIVE

## 2020-03-10 MED ORDER — PARAGARD INTRAUTERINE COPPER IU IUD: INTRAUTERINE_SYSTEM | Freq: Once | INTRAUTERINE | Status: AC

## 2020-03-10 MED ORDER — LEVONORGESTREL 20 MCG/24HR IU IUD: INTRAUTERINE_SYSTEM | Freq: Once | INTRAUTERINE | Status: DC

## 2020-03-10 NOTE — Progress Notes (Signed)
    Post Partum Visit Note  Dominique Cannon is a 34 y.o. [phone] female who presents for a postpartum visit. She is 5 weeks postpartum following a normal spontaneous vaginal delivery.  I have fully reviewed the prenatal and intrapartum course. The delivery was at 38.4 gestational weeks.  Anesthesia: none. Postpartum course has been unremarkable. Baby is doing wellunremarkable. Baby is feeding by breast. Bleeding staining only. Bowel function is normal. Bladder function is normal. Patient is not sexually active. Contraception method is IUD. Postpartum depression screening: negative.  The following portions of the patient's history were reviewed and updated as appropriate: allergies, current medications, past family history, past medical history, past social history, past surgical history and problem list.  Review of Systems Pertinent items noted in HPI and remainder of comprehensive ROS otherwise negative.    Objective:  unknown if currently breastfeeding.  General:  alert, cooperative, appears stated age and no distress   Breasts:  negative  Lungs: clear to auscultation bilaterally  Heart:  regular rate and rhythm, S1, S2 normal, no murmur, click, rub or gallop  Abdomen: soft, non-tender; bowel sounds normal; no masses,  no organomegaly   Vulva:  not evaluated  Vagina: not evaluated  Rectal Exam: Not performed.        Assessment:    Normal postpartum exam. Pap smear not done at today's visit.   Plan:   Essential components of care per ACOG recommendations:  1.  Mood and well being: Patient with negative depression screening today. Reviewed local resources for support.  - Patient does not use tobacco.  - hx of drug use? No   2. Infant care and feeding:  -Patient currently breastmilk feeding?   3. Sexuality, contraception and birth spacing - Patient does not want a pregnancy in the next year.   - Reviewed forms of contraception in tiered fashion. Patient desired IUD today.   -  Discussed birth spacing of 18 months  4. Sleep and fatigue -Encouraged family/partner/community support of 4 hrs of uninterrupted sleep to help with mood and fatigue  5. Physical Recovery  - Discussed patients delivery and complications - Patient had a 1st degree laceration, perineal healing reviewed. Patient expressed understanding - Patient has urinary incontinence? No - Patient is safe to resume physical and sexual activity  6.  Health Maintenance - Last pap smear done 09/2018 and was normal with negative HPV.   Clayton Bibles, MSN, CNM Certified Nurse Midwife, [employer] for [employer], [facility] 03/10/20 1:49 PM

## 2020-03-10 NOTE — Progress Notes (Signed)
    GYNECOLOGY OFFICE PROCEDURE NOTE  Dominique Cannon is a 34 y.o. [phone] here for Paragard IUD insertion. No GYN concerns.  Last pap smear was on 09/20/2018 and was normal.  IUD Insertion Procedure Note Patient identified, informed consent performed, consent signed.   Discussed risks of irregular bleeding, cramping, infection, malpositioning or misplacement of the IUD outside the uterus which may require further procedure such as laparoscopy. Also discussed >99% contraception efficacy, increased risk of ectopic pregnancy with failure of method.  Time out was performed.  Urine pregnancy test negative.  Speculum placed in the vagina.  Cervix visualized.  Cleaned with Betadine x 2.  Grasped anteriorly with a single tooth tenaculum.  Uterus sounded to 7 cm.  Paragard IUD placed per manufacturer's recommendations.  Strings trimmed to 3 cm. Tenaculum was removed, good hemostasis noted.  Patient tolerated procedure well.   Patient was given post-procedure instructions.  She was advised to have backup contraception for one week.  Patient was also asked to check IUD strings periodically and follow up in 4 weeks for IUD check.  Clayton Bibles, MSN, CNM Certified Nurse Midwife, [facility] 03/10/20 10:16 AM

## 2020-03-10 NOTE — Patient Instructions (Signed)

## 2020-03-15 ENCOUNTER — Ambulatory Visit: Payer: BC Managed Care – PPO | Attending: Internal Medicine

## 2020-03-15 DIAGNOSIS — Z23 Encounter for immunization: Secondary | ICD-10-CM

## 2020-03-15 NOTE — Progress Notes (Signed)
   Covid-19 Vaccination Clinic  Name:  Dominique Cannon    MRN: [id number] DOB: 01-03-1986  03/15/2020  Dominique Cannon was observed post Covid-19 immunization for 15 minutes without incident. She was provided with Vaccine Information Sheet and instruction to access the V-Safe system.   Dominique Cannon was instructed to call 911 with any severe reactions post vaccine: Marland Kitchen Difficulty breathing  . Swelling of face and throat  . A fast heartbeat  . A bad rash all over body  . Dizziness and weakness   Immunizations Administered    Name Date Dose VIS Date Route   Pfizer COVID-19 Vaccine 03/15/2020  4:36 PM 0.3 mL 01/07/2019 Intramuscular   Manufacturer: [employer], [employer]   Lot: [id number]   NDC: [address]-1

## 2020-04-07 ENCOUNTER — Encounter: Payer: Self-pay | Admitting: Advanced Practice Midwife

## 2020-04-07 ENCOUNTER — Ambulatory Visit (INDEPENDENT_AMBULATORY_CARE_PROVIDER_SITE_OTHER): Payer: BC Managed Care – PPO | Admitting: Advanced Practice Midwife

## 2020-04-07 ENCOUNTER — Other Ambulatory Visit: Payer: Self-pay

## 2020-04-07 VITALS — BP 131/87 | HR 106

## 2020-04-07 DIAGNOSIS — Z975 Presence of (intrauterine) contraceptive device: Secondary | ICD-10-CM | POA: Diagnosis not present

## 2020-04-07 NOTE — Progress Notes (Signed)
    GYNECOLOGY OFFICE ENCOUNTER NOTE  History:  34 y.o. [id number] here today for today for IUD string check; Paragard  IUD was placed 03/10/2020. No complaints about the IUD, no concerning side effects.  The following portions of the patient's history were reviewed and updated as appropriate: allergies, current medications, past family history, past medical history, past social history, past surgical history and problem list. Last pap smear on 09/2018 was normal, negative HRHPV.  Review of Systems:  Pertinent items are noted in HPI.   Objective:  Physical Exam Blood pressure 131/87, pulse (!) 106, currently breastfeeding. CONSTITUTIONAL: Well-developed, well-nourished female in no acute distress.  HENT:  Normocephalic, atraumatic. External right and left ear normal. Oropharynx is clear and moist EYES: Conjunctivae and EOM are normal. Pupils are equal, round, and reactive to light. No scleral icterus.  NECK: Normal range of motion, supple, no masses CARDIOVASCULAR: Normal heart rate noted RESPIRATORY: Effort and breath sounds normal, no problems with respiration noted ABDOMEN: Soft, no distention noted.   PELVIC: Normal appearing external genitalia; normal appearing vaginal mucosa and cervix.  IUD strings visualized, about 3 cm in length outside cervix.   Assessment & Plan:  Patient to keep IUD in place for up to ten years; can come in for removal if she desires pregnancy earlier or for any concerning side effects.   Clayton Bibles, MSN, CNM Certified Nurse Midwife, [employer] for [employer], [facility] 04/07/20 3:07 PM

## 2020-04-07 NOTE — Patient Instructions (Signed)

## 2020-06-10 NOTE — Progress Notes (Signed)
34 y.o. [id number] Married White or Caucasian female here for annual exam.  Doing well.  Had post partum paragard IUD placed after birth of second son.  Not sure what they want to do with future pregnancies at this point.  Oldest is having trouble with ear infections.  He now has RSV.    She is going to stay at home this fall with her sons.  She's really excited about this.    Nursing so hasn't really had a cycle.    Saw Dr. Jana Hakim 02/2020.  Planning every 6 months MMG/MRI after stops nursing.        Sexually active: Yes.    The current method of family planning is IUD.   Paragard IUD Exercising: No.  exercise Smoker:  no  Health Maintenance: Pap:  08-02-16 neg, 09-20-18 neg HPV HR neg History of abnormal Pap:  no MMG:  Breast MRI 11/18 & 10/18 category c density birads 1:neg Colonoscopy:  none BMD:   none TDaP:  2021 Screening Labs: not indicated   reports that she has never smoked. She has never used smokeless tobacco. She reports previous alcohol use. She reports that she does not use drugs.  Past Medical History:  Diagnosis Date  . BRCA2 positive   . Family history of breast cancer in mother    mother age 33 died of breast cancer  . Family history of ovarian cancer     Past Surgical History:  Procedure Laterality Date  . DILATION AND EVACUATION N/A 01/16/2017   Procedure: DILATATION AND EVACUATION;  Surgeon: Megan Salon, MD;  Location: [facility];  Service: Gynecology;  Laterality: N/A;  . WISDOM TOOTH EXTRACTION      Current Outpatient Medications  Medication Sig Dispense Refill  . PARAGARD INTRAUTERINE COPPER IU by Intrauterine route.    . Prenatal Vit-Fe Fumarate-FA (PRENATAL VITAMIN PO) Take 1 tablet by mouth daily.      No current facility-administered medications for this visit.    Family History  Problem Relation Age of Onset  . Breast cancer Mother 66  . Ovarian cancer Maternal Grandmother        dx in her 21s  . Breast cancer Paternal Grandmother   . Cancer  Maternal Uncle        NOS  . Other Other        positive genetic testing for "the gene"  . Breast cancer Other        MGF's sister  . Breast cancer Other        MGF's mother    Review of Systems  Constitutional: Negative.   HENT: Negative.   Eyes: Negative.   Respiratory: Negative.   Cardiovascular: Negative.   Gastrointestinal: Negative.   Endocrine: Negative.   Genitourinary: Negative.   Musculoskeletal: Negative.   Skin: Negative.   Allergic/Immunologic: Negative.   Neurological: Negative.   Hematological: Negative.   Psychiatric/Behavioral: Negative.     Exam:   BP 116/76   Pulse 68   Resp 16   Ht 5' 8.25" (1.734 m)   Wt 131 lb (59.4 kg)   BMI 19.77 kg/m   Height: 5' 8.25" (173.4 cm)  General appearance: alert, cooperative and appears stated age Head: Normocephalic, without obvious abnormality, atraumatic Neck: no adenopathy, supple, symmetrical, trachea midline and thyroid normal to inspection and palpation Lungs: clear to auscultation bilaterally Breasts: normal appearance, no masses or tenderness Heart: regular rate and rhythm Abdomen: soft, non-tender; bowel sounds normal; no masses,  no organomegaly Extremities: extremities  normal, atraumatic, no cyanosis or edema Skin: Skin color, texture, turgor normal. No rashes or lesions Lymph nodes: Cervical, supraclavicular, and axillary nodes normal. No abnormal inguinal nodes palpated Neurologic: Grossly normal   Pelvic: External genitalia:  no lesions              Urethra:  normal appearing urethra with no masses, tenderness or lesions              Bartholins and Skenes: normal                 Vagina: normal appearing vagina with normal color and discharge, no lesions              Cervix: no lesions              Pap taken: No. Bimanual Exam:  Uterus:  normal size, contour, position, consistency, mobility, non-tender              Adnexa: normal adnexa and no mass, fullness, tenderness                Rectovaginal: Confirms               Anus:  normal sphincter tone, no lesions  Chaperone, Terence Lux, CMA, was present for exam.  A:  Well Woman with normal exam H/o BRCA 2 genet mutation, [id number] Family hx of breast and ovarian cancer Paragard IUD H/o two miscarriages, two NSVDs  P:   Mammogram and MRI will be started again (alternating every 6 months) after she is done nursing pap smear with HR HPV 07/2018.  Not indicated today. PUS and ca-125 will be scheduled.  She desires to do both the same day Return annually or prn

## 2020-06-14 ENCOUNTER — Other Ambulatory Visit: Payer: Self-pay

## 2020-06-14 ENCOUNTER — Encounter: Payer: Self-pay | Admitting: Obstetrics & Gynecology

## 2020-06-14 ENCOUNTER — Ambulatory Visit: Payer: BC Managed Care – PPO | Admitting: Obstetrics & Gynecology

## 2020-06-14 VITALS — BP 116/76 | HR 68 | Resp 16 | Ht 68.25 in | Wt 131.0 lb

## 2020-06-14 DIAGNOSIS — Z1509 Genetic susceptibility to other malignant neoplasm: Secondary | ICD-10-CM | POA: Diagnosis not present

## 2020-06-14 DIAGNOSIS — Z1501 Genetic susceptibility to malignant neoplasm of breast: Secondary | ICD-10-CM

## 2020-06-14 DIAGNOSIS — Z1502 Genetic susceptibility to malignant neoplasm of ovary: Secondary | ICD-10-CM | POA: Diagnosis not present

## 2020-06-14 DIAGNOSIS — Z01419 Encounter for gynecological examination (general) (routine) without abnormal findings: Secondary | ICD-10-CM | POA: Diagnosis not present

## 2020-06-15 ENCOUNTER — Telehealth: Payer: Self-pay | Admitting: Obstetrics & Gynecology

## 2020-06-15 NOTE — Telephone Encounter (Signed)
Call to patient. Per DPR, OK to leave message on voicemail.  Left voicemail requesting a return call to [facility] to review benefits and schedule recommended Pelvic ultrasound with M. Suzanne Miller, MD 

## 2020-06-17 NOTE — Telephone Encounter (Signed)
Spoke with patient regarding benefits for recommended ultrasound. Patient is aware that ultrasound is transvaginal. Patient acknowledges understanding of information presented. Patient is aware of cancellation policy. Patient scheduled appointment for 07/01/2020 at 0800AM with M. Leda Quail, MD. Encounter closed.

## 2020-07-01 ENCOUNTER — Ambulatory Visit (INDEPENDENT_AMBULATORY_CARE_PROVIDER_SITE_OTHER): Payer: BC Managed Care – PPO

## 2020-07-01 ENCOUNTER — Other Ambulatory Visit: Payer: Self-pay

## 2020-07-01 ENCOUNTER — Ambulatory Visit: Payer: BC Managed Care – PPO | Admitting: Obstetrics & Gynecology

## 2020-07-01 ENCOUNTER — Encounter: Payer: Self-pay | Admitting: Obstetrics & Gynecology

## 2020-07-01 VITALS — BP 110/68 | HR 64 | Resp 16 | Wt 131.0 lb

## 2020-07-01 DIAGNOSIS — Z1509 Genetic susceptibility to other malignant neoplasm: Secondary | ICD-10-CM

## 2020-07-01 DIAGNOSIS — Z1502 Genetic susceptibility to malignant neoplasm of ovary: Secondary | ICD-10-CM | POA: Diagnosis not present

## 2020-07-01 DIAGNOSIS — Z1501 Genetic susceptibility to malignant neoplasm of breast: Secondary | ICD-10-CM

## 2020-07-01 NOTE — Progress Notes (Signed)
35 y.o. [id number] Married White or Caucasian female here for pelvic ultrasound and ca-125 due to hx of BRCA 2 gene mutation.  Has cooper IUD for contraception.  Not sure about future family planning but not ready to make any permanent decisions about child bearing in the future.  Aware of risk reduction oophorectomy recommendations when child bearing complete once between ages 9-40.  Still breast feeding.    No LMP recorded. (Menstrual status: IUD).  Contraception: paragard IUD  Findings:  UTERUS: 7.1 x 5.2 x 3.4cm EMS: 3.43mm ADNEXA: Left ovary:  3.2 x 2.2 x 1.2cm       Right ovary: 2.9 x 1.8 x 1.5cm.  Bilateral ovaries with normal blood flow and follicular patterns. CUL DE SAC: no free fluid  Discussion:  Findings reviewed.  Ultrasound images reviewed.  Ultrasonographer supervised.  Ca-125 needs to be obtained today.  Reviewed follow up recommendations and will have repeat testing in six months.  Assessment:  BRCA 2 gene mutation with increased risks for ovarian and breast cancer  Plan:  Ca-125 obtained today Has appt with Dr. Jana Hakim in April Follow up ultrasound and ca-125 in 6 months

## 2020-07-02 LAB — CA 125: Cancer Antigen (CA) 125: 15 U/mL (ref 0.0–38.1)

## 2020-09-02 ENCOUNTER — Ambulatory Visit: Payer: BC Managed Care – PPO | Admitting: Dermatology

## 2020-10-19 ENCOUNTER — Other Ambulatory Visit: Payer: Self-pay

## 2020-10-19 ENCOUNTER — Ambulatory Visit: Payer: 59 | Admitting: Family Medicine

## 2020-10-19 ENCOUNTER — Encounter: Payer: Self-pay | Admitting: Family Medicine

## 2020-10-19 VITALS — BP 110/80 | HR 83 | Temp 97.9°F | Ht 68.0 in | Wt 120.8 lb

## 2020-10-19 DIAGNOSIS — Z23 Encounter for immunization: Secondary | ICD-10-CM | POA: Diagnosis not present

## 2020-10-19 DIAGNOSIS — Z1501 Genetic susceptibility to malignant neoplasm of breast: Secondary | ICD-10-CM

## 2020-10-19 DIAGNOSIS — Z0000 Encounter for general adult medical examination without abnormal findings: Secondary | ICD-10-CM

## 2020-10-19 DIAGNOSIS — Z1509 Genetic susceptibility to other malignant neoplasm: Secondary | ICD-10-CM

## 2020-10-19 NOTE — Progress Notes (Signed)
Annual Exam   Chief Complaint:  Chief Complaint  Patient presents with  . Establish Care  . Form Completion    History of Present Illness:  Ms. Dominique Cannon is a 34 y.o. [phone] who LMP was No LMP recorded. (Menstrual status: IUD)., presents today for her annual examination.      Nutrition/Lifestyle Exercise: walks the dog, active with the kids Diet: not great, breast feeding and having a hardtime She does get adequate calcium and Vitamin D in her diet.  Social History   Tobacco Use  Smoking Status Never Smoker  Smokeless Tobacco Never Used   Social History   Substance and Sexual Activity  Alcohol Use Not Currently   Social History   Substance and Sexual Activity  Drug Use No     Safety The patient wears seatbelts: yes.     The patient feels safe at home and in their relationships: yes.  General Health Dentist in the last year: Yes Eye doctor: yes  Menstrual Currently breast feeding  No cycle since IUD placed post-partum  GYN She is single partner, contraception - IUD.     Cervical Cancer Screening (Age 44-65) Last Pap:  November 2019 Results were: no abnormalities with HPV negative  Family History of Breast Cancer: yes Family History of Ovarian Cancer: yes    Weight Wt Readings from Last 3 Encounters:  10/19/20 120 lb 12 oz (54.8 kg)  07/01/20 131 lb (59.4 kg)  06/14/20 131 lb (59.4 kg)   Patient has low BMI  BMI Readings from Last 1 Encounters:  10/19/20 18.36 kg/m     Chronic disease screening Blood pressure monitoring:  BP Readings from Last 3 Encounters:  10/19/20 110/80  07/01/20 110/68  06/14/20 116/76     Lipid Monitoring: Indication for screening: age >11, obesity, diabetes, family hx, CV risk factors.  Lipid screening: no  No results found for: CHOL, HDL, LDLCALC, LDLDIRECT, TRIG, CHOLHDL   Diabetes Screening: age >47, overweight, family hx, PCOS, hx of gestational diabetes, at risk ethnicity, elevated blood pressure  >135/80.  Diabetes Screening screening: No  No results found for: HGBA1C    Past Medical History:  Diagnosis Date  . BRCA2 positive   . Family history of breast cancer in mother    mother age 45 died of breast cancer  . Family history of ovarian cancer     Past Surgical History:  Procedure Laterality Date  . DILATION AND EVACUATION N/A 01/16/2017   Procedure: DILATATION AND EVACUATION;  Surgeon: Megan Salon, MD;  Location: [facility];  Service: Gynecology;  Laterality: N/A;  . WISDOM TOOTH EXTRACTION      Prior to Admission medications   Medication Sig Start Date End Date Taking? Authorizing Provider  PARAGARD INTRAUTERINE COPPER IU by Intrauterine route.   Yes [provider]  Prenatal Vit-Fe Fumarate-FA (PRENATAL VITAMIN PO) Take 1 tablet by mouth daily.    Yes [provider]    No Known Allergies  Gynecologic History: No LMP recorded. (Menstrual status: IUD).  Obstetric History: [id number]  Social History   Socioeconomic History  . Marital status: Married    Spouse name: Dian Situ  . Number of children: 2  . Years of education: Bachelors  . Highest education level: Not on file  Occupational History  . Not on file  Tobacco Use  . Smoking status: Never Smoker  . Smokeless tobacco: Never Used  Vaping Use  . Vaping Use: Never used  Substance and Sexual Activity  . Alcohol use:  Not Currently  . Drug use: No  . Sexual activity: Yes    Partners: Male    Birth control/protection: I.U.D.  Other Topics Concern  . 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 Difficulty of Paying Living Expenses: Not on file  Food Insecurity:   . Worried About Charity fundraiser in the Last Year: Not on file  . Ran Out of Food in the Last Year: Not on file  Transportation Needs:   . Lack of Transportation (Medical): Not on file  . Lack of Transportation (Non-Medical): Not on file  Physical Activity:   . Days of Exercise per Week: Not on file  . Minutes of Exercise per Session: Not on file  Stress:   . Feeling of Stress : Not on file  Social Connections:   . Frequency of Communication with Friends and Family: Not on file  . Frequency of Social Gatherings with Friends and Family: Not on file  . Attends Religious Services: Not on file  . Active Member of Clubs or Organizations: Not on file  . Attends Archivist Meetings: Not on file  . Marital Status: Not on file  Intimate Partner Violence:   . Fear of Current or Ex-Partner: Not on file  . Emotionally Abused: Not on file  . Physically Abused: Not on file  . Sexually Abused: Not on file    Family History  Problem Relation Age of Onset  . Breast cancer Mother 44  . Ovarian cancer Maternal Grandmother        dx in her 38s  . Breast cancer Paternal Grandmother   . Cancer Maternal Uncle        not sure what type  . Other Other        positive genetic testing for "the gene"  . Breast cancer Other        MGF's sister  . Breast cancer Other        MGF's mother    Review of Systems  Constitutional: Negative for chills and fever.  HENT: Negative for congestion and sore throat.   Eyes: Negative for blurred vision and double vision.  Respiratory: Negative for shortness of breath.   Cardiovascular: Negative for chest pain.  Gastrointestinal: Negative for heartburn, nausea and vomiting.  Genitourinary: Negative.   Musculoskeletal: Negative.  Negative for myalgias.  Skin: Negative for rash.   Neurological: Negative for dizziness and headaches.  Endo/Heme/Allergies: Does not bruise/bleed easily.  Psychiatric/Behavioral: Negative for depression. The patient is not nervous/anxious.      Physical Exam BP 110/80   Pulse 83   Temp 97.9 F (36.6 C) (Temporal)   Ht $R'5\' 8"'nR$  (1.727 m)   Wt 120 lb 12 oz (54.8 kg)   SpO2 99%   Breastfeeding Yes   BMI 18.36 kg/m  BP Readings from Last 3 Encounters:  10/19/20 110/80  07/01/20 110/68  06/14/20 116/76    Wt Readings from Last 3 Encounters:  10/19/20 120 lb 12 oz (54.8 kg)  07/01/20 131 lb (59.4 kg)  06/14/20 131 lb (59.4 kg)     Physical Exam Constitutional:      General: She is not in acute distress.    Appearance: She is well-developed. She is not diaphoretic.  HENT:     Head: Normocephalic and atraumatic.     Right Ear: External ear normal.     Left Ear: External ear normal.     Nose: Nose normal.  Eyes:     General: No scleral icterus.    Conjunctiva/sclera: Conjunctivae normal.  Cardiovascular:     Rate and Rhythm: Normal rate and regular rhythm.     Heart sounds: No murmur heard.   Pulmonary:     Effort: Pulmonary effort is normal. No respiratory distress.     Breath sounds: Normal breath sounds. No wheezing.  Abdominal:     General: Bowel sounds are normal. There is no distension.     Palpations: Abdomen is soft. There is no mass.     Tenderness: There is no abdominal tenderness. There is no guarding or rebound.  Musculoskeletal:        General: Normal range of motion.     Cervical back: Neck supple.  Lymphadenopathy:     Cervical: No cervical adenopathy.  Skin:    General: Skin is warm and dry.     Capillary Refill: Capillary refill takes less than 2 seconds.  Neurological:     Mental Status: She is alert and oriented to person, place, and time.     Deep Tendon Reflexes: Reflexes normal.  Psychiatric:        Behavior: Behavior normal.       Results: Depression screen PHQ 2/9 10/19/2020   Decreased Interest 0  Down, Depressed, Hopeless 0  PHQ - 2 Score 0      Assessment: 34 y.o. [id number] female here for routine annual examination.  Plan: Problem List Items Addressed This Visit      Other   BRCA2 positive    Following with oncology and OB/GYN for screening.        Other Visit Diagnoses    Annual physical exam    -  Primary   Need for influenza vaccination       Relevant Orders   Flu Vaccine QUAD 36+ mos IM (Completed)       Screening: -- Blood pressure screen normal -- cholesterol screening: not due for screening -- Weight screening: normal -- Diabetes Screening: not due for screening -- Nutrition: encouraged healthy diet   Psych -- Depression screening (PHQ-9):    Safety -- tobacco screening: not using -- alcohol screening:  low-risk usage. -- no evidence of domestic violence or intimate partner violence.   Cancer Screening -- pap smear not collected per ASCCP guidelines -- family history of breast cancer screening: done. not at high risk.   Immunizations Immunization History  Administered Date(s) Administered  . Influenza Inj Mdck Quad Pf 08/09/2017  . Influenza,inj,Quad PF,6+ Mos 07/01/2018, 08/05/2019, 10/19/2020  . Influenza-Unspecified 09/22/2015, 09/12/2016  . PFIZER SARS-COV-2 Vaccination 02/19/2020, 03/15/2020  . Tdap 05/01/2018, 12/02/2019    -- flu vaccine up to date -- TDAP q10 years up to date -- Covid-19 Vaccine up to date  Encouraged regular vision and dental screening. Encouraged healthy exercise and diet.   Lesleigh Noe

## 2020-10-19 NOTE — Assessment & Plan Note (Signed)
Following with oncology and OB/GYN for screening.

## 2020-10-19 NOTE — Patient Instructions (Signed)
Preventive Care 21-32 Years Old, Female Preventive care refers to visits with your health care provider and lifestyle choices that can promote health and wellness. This includes:  A yearly physical exam. This may also be called an annual well check.  Regular dental visits and eye exams.  Immunizations.  Screening for certain conditions.  Healthy lifestyle choices, such as eating a healthy diet, getting regular exercise, not using drugs or products that contain nicotine and tobacco, and limiting alcohol use. What can I expect for my preventive care visit? Physical exam Your health care provider will check your:  Height and weight. This may be used to calculate body mass index (BMI), which tells if you are at a healthy weight.  Heart rate and blood pressure.  Skin for abnormal spots. Counseling Your health care provider may ask you questions about your:  Alcohol, tobacco, and drug use.  Emotional well-being.  Home and relationship well-being.  Sexual activity.  Eating habits.  Work and work environment.  Method of birth control.  Menstrual cycle.  Pregnancy history. What immunizations do I need?  Influenza (flu) vaccine  This is recommended every year. Tetanus, diphtheria, and pertussis (Tdap) vaccine  You may need a Td booster every 10 years. Varicella (chickenpox) vaccine  You may need this if you have not been vaccinated. Human papillomavirus (HPV) vaccine  If recommended by your health care provider, you may need three doses over 6 months. Measles, mumps, and rubella (MMR) vaccine  You may need at least one dose of MMR. You may also need a second dose. Meningococcal conjugate (MenACWY) vaccine  One dose is recommended if you are age 19-21 years and a first-year college student living in a residence hall, or if you have one of several medical conditions. You may also need additional booster doses. Pneumococcal conjugate (PCV13) vaccine  You may need  this if you have certain conditions and were not previously vaccinated. Pneumococcal polysaccharide (PPSV23) vaccine  You may need one or two doses if you smoke cigarettes or if you have certain conditions. Hepatitis A vaccine  You may need this if you have certain conditions or if you travel or work in places where you may be exposed to hepatitis A. Hepatitis B vaccine  You may need this if you have certain conditions or if you travel or work in places where you may be exposed to hepatitis B. Haemophilus influenzae type b (Hib) vaccine  You may need this if you have certain conditions. You may receive vaccines as individual doses or as more than one vaccine together in one shot (combination vaccines). Talk with your health care provider about the risks and benefits of combination vaccines. What tests do I need?  Blood tests  Lipid and cholesterol levels. These may be checked every 5 years starting at age 20.  Hepatitis C test.  Hepatitis B test. Screening  Diabetes screening. This is done by checking your blood sugar (glucose) after you have not eaten for a while (fasting).  Sexually transmitted disease (STD) testing.  BRCA-related cancer screening. This may be done if you have a family history of breast, ovarian, tubal, or peritoneal cancers.  Pelvic exam and Pap test. This may be done every 3 years starting at age 21. Starting at age 30, this may be done every 5 years if you have a Pap test in combination with an HPV test. Talk with your health care provider about your test results, treatment options, and if necessary, the need for more tests.   Follow these instructions at home: Eating and drinking   Eat a diet that includes fresh fruits and vegetables, whole grains, lean protein, and low-fat dairy.  Take vitamin and mineral supplements as recommended by your health care provider.  Do not drink alcohol if: ? Your health care provider tells you not to drink. ? You are  pregnant, may be pregnant, or are planning to become pregnant.  If you drink alcohol: ? Limit how much you have to 0-1 drink a day. ? Be aware of how much alcohol is in your drink. In the [place] one drink equals one 12 oz bottle of beer (355 mL), one 5 oz glass of wine (148 mL), or one 1 oz glass of hard liquor (44 mL). Lifestyle  Take daily care of your teeth and gums.  Stay active. Exercise for at least 30 minutes on 5 or more days each week.  Do not use any products that contain nicotine or tobacco, such as cigarettes, e-cigarettes, and chewing tobacco. If you need help quitting, ask your health care provider.  If you are sexually active, practice safe sex. Use a condom or other form of birth control (contraception) in order to prevent pregnancy and STIs (sexually transmitted infections). If you plan to become pregnant, see your health care provider for a preconception visit. What's next?  Visit your health care provider once a year for a well check visit.  Ask your health care provider how often you should have your eyes and teeth checked.  Stay up to date on all vaccines. This information is not intended to replace advice given to you by your health care provider. Make sure you discuss any questions you have with your health care provider. Document Revised: 07/11/2018 Document Reviewed: 07/11/2018 Elsevier Patient Education  2020 [employer].

## 2021-02-15 ENCOUNTER — Ambulatory Visit (HOSPITAL_BASED_OUTPATIENT_CLINIC_OR_DEPARTMENT_OTHER)
Admission: RE | Admit: 2021-02-15 | Discharge: 2021-02-15 | Disposition: A | Discharge status: Home / Self Care | Payer: 59 | Source: Ambulatory Visit | Attending: Obstetrics & Gynecology | Admitting: Obstetrics & Gynecology

## 2021-02-15 ENCOUNTER — Other Ambulatory Visit: Payer: Self-pay

## 2021-02-15 ENCOUNTER — Other Ambulatory Visit (HOSPITAL_BASED_OUTPATIENT_CLINIC_OR_DEPARTMENT_OTHER)
Admission: RE | Admit: 2021-02-15 | Discharge: 2021-02-15 | Disposition: A | Discharge status: Home / Self Care | Payer: 59 | Source: Ambulatory Visit | Attending: Obstetrics & Gynecology | Admitting: Obstetrics & Gynecology

## 2021-02-15 ENCOUNTER — Encounter (HOSPITAL_BASED_OUTPATIENT_CLINIC_OR_DEPARTMENT_OTHER): Payer: Self-pay | Admitting: Obstetrics & Gynecology

## 2021-02-15 ENCOUNTER — Ambulatory Visit (INDEPENDENT_AMBULATORY_CARE_PROVIDER_SITE_OTHER): Payer: 59 | Admitting: Obstetrics & Gynecology

## 2021-02-15 VITALS — BP 127/80 | HR 62 | Ht 68.0 in | Wt 123.0 lb

## 2021-02-15 DIAGNOSIS — Z9189 Other specified personal risk factors, not elsewhere classified: Secondary | ICD-10-CM

## 2021-02-15 DIAGNOSIS — N83201 Unspecified ovarian cyst, right side: Secondary | ICD-10-CM | POA: Diagnosis not present

## 2021-02-15 DIAGNOSIS — Z1501 Genetic susceptibility to malignant neoplasm of breast: Secondary | ICD-10-CM

## 2021-02-15 DIAGNOSIS — Z975 Presence of (intrauterine) contraceptive device: Secondary | ICD-10-CM | POA: Insufficient documentation

## 2021-02-15 DIAGNOSIS — Z1509 Genetic susceptibility to other malignant neoplasm: Secondary | ICD-10-CM | POA: Insufficient documentation

## 2021-02-15 NOTE — Progress Notes (Signed)
GYNECOLOGY  VISIT  CC:   H/o BRCA 2 positive status  HPI: 35 y.o. G4P2022 Married White or Caucasian female here for reestablishing care after birth of second son.  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  So happy with this.  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  Has been really good.  Pt is still nursing a little bit at night.  Last MMG and MRI 2018.  Youngest has a milk allergy that is still being investigated and this is why she is still nursing some.  She knows will need to stop breast feeding for 6 months before MMG.    Had a paragard IUD placed 04/07/2020.  She has only had two cycles since delivery.  These were the last two months.  Flow lasted a week with heavier flow for 4-5 days.  She did pass some clots.    She is not sure about future pregnancy.  May want one more child but "not quite yet".    Has appt in about 3 weeks with Dr. Chrissie Noa.  He really wanted her appointments on a six month alternating schedule with me so this may need to be adjusted.  Last Pap smear:  09/2018.  Neg with neg HR HPV.  Not due yet.  GYNECOLOGIC HISTORY: Patient's last menstrual period was 02/09/2021. Contraception: copper IUD  Patient Active Problem List   Diagnosis Date Noted  . Breast cancer screening, high risk patient 03/03/2020  . At high risk for breast cancer 03/03/2020  . High risk of ovarian cancer 03/03/2020  . History of shoulder dystocia in prior pregnancy, currently pregnant 12/16/2019  . 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 Family history of breast cancer     Past Medical History:  Diagnosis Date  . BRCA2 positive   . Family history of breast cancer in mother    mother age 71 died of breast cancer  . Family history of ovarian cancer     Past Surgical History:  Procedure Laterality Date  . DILATION AND EVACUATION N/A 01/16/2017   Procedure: DILATATION AND  EVACUATION;  Surgeon: Jerene Bears, MD;  Location: [facility];  Service: Gynecology;  Laterality: N/A;  . WISDOM TOOTH EXTRACTION      MEDS:   Current Outpatient Medications on File Prior to Visit  Medication Sig Dispense Refill  . PARAGARD INTRAUTERINE COPPER IU by Intrauterine route.    . Prenatal Vit-Fe Fumarate-FA (PRENATAL VITAMIN PO) Take 1 tablet by mouth daily.      No current facility-administered medications on file prior to visit.    ALLERGIES: Patient has no known allergies.  Family History  Problem Relation Age of Onset  . Breast cancer Mother 33  . Ovarian cancer Maternal Grandmother        dx in her 57s  . Breast cancer Paternal Grandmother   . Cancer Maternal Uncle        not sure what type  . Other Other        positive genetic testing for "the gene"  . Breast cancer Other        MGF's sister  . Breast cancer Other        MGF's mother    SH:  Married, non smoker  Review of Systems  All other systems reviewed and are negative.   PHYSICAL EXAMINATION:    BP 127/80   Pulse  62   Ht $R'5\' 8"'KJ$  (1.727 m)   Wt 123 lb (55.8 kg)   LMP 02/09/2021   BMI 18.70 kg/m     General appearance: alert, cooperative and appears stated age Neck: no adenopathy, supple, symmetrical, trachea midline and thyroid normal to inspection and palpation Breasts: normal appearance, no masses or tenderness Lymph:  no axillary or clavicular LAD palpated  Chaperone, Prince Rome, CMA, was present for exam.  Assessment/Plan: 1. BRCA2 positive - CA 125; Future - US PELVIC COMPLETE WITH TRANSVAGINAL; Future - will need follow up with me after November for AEX/pap smear.  If Dr. Jana Hakim is ok with follow up in 6 months, then I will see her in about a year and get back on alternating 6 month schedule. - will plan MMG and breast MRI after nursing completed  2. High risk of ovarian cancer - risk reducing BSO after child bearing, typically recommended between 35-40 reviewed again today with  pt.  She is not ready for this right now.

## 2021-02-16 LAB — CA 125: Cancer Antigen (CA) 125: 34.5 U/mL (ref 0.0–38.1)

## 2021-02-17 ENCOUNTER — Other Ambulatory Visit: Payer: Self-pay | Admitting: Oncology

## 2021-02-22 ENCOUNTER — Encounter (HOSPITAL_BASED_OUTPATIENT_CLINIC_OR_DEPARTMENT_OTHER): Payer: Self-pay

## 2021-02-22 ENCOUNTER — Telehealth: Payer: Self-pay | Admitting: Oncology

## 2021-02-22 ENCOUNTER — Telehealth: Payer: Self-pay

## 2021-02-22 NOTE — Telephone Encounter (Signed)
Called pt to r/s appt per 4/7 sch msg. Pt asked to speak to nurse before r/s. Transferred call to desk nurse per pt request

## 2021-02-22 NOTE — Telephone Encounter (Signed)
Pt called to inform us she has completed breastfeeding and asks to be scheduled for mammo before seeing Dr Thea Silversmith 03/03/2021. This LPN attempted to call pt to ask if OK to send to [facility] as [facility] is behind. LVM for pt to return call to office.

## 2021-02-23 ENCOUNTER — Telehealth: Payer: Self-pay

## 2021-02-23 NOTE — Telephone Encounter (Signed)
Pt returned call. Attempted to call pt back. LVM for her to return call. This was second attempt of getting in touch with pt.

## 2021-03-01 ENCOUNTER — Telehealth: Payer: Self-pay | Admitting: Oncology

## 2021-03-01 ENCOUNTER — Other Ambulatory Visit: Payer: Self-pay | Admitting: Oncology

## 2021-03-01 DIAGNOSIS — Z9189 Other specified personal risk factors, not elsewhere classified: Secondary | ICD-10-CM

## 2021-03-01 DIAGNOSIS — Z1501 Genetic susceptibility to malignant neoplasm of breast: Secondary | ICD-10-CM

## 2021-03-01 DIAGNOSIS — Z1239 Encounter for other screening for malignant neoplasm of breast: Secondary | ICD-10-CM

## 2021-03-01 NOTE — Telephone Encounter (Signed)
Scheduled appt per 4/19 sch msg. Called pt, no answer. Left msg with appt date and time.  

## 2021-03-03 ENCOUNTER — Ambulatory Visit: Payer: BC Managed Care – PPO | Admitting: Oncology

## 2021-03-17 ENCOUNTER — Other Ambulatory Visit (HOSPITAL_BASED_OUTPATIENT_CLINIC_OR_DEPARTMENT_OTHER): Payer: 59

## 2021-03-18 NOTE — Telephone Encounter (Signed)
Patient states she has stopped breastfeeding. Do you want to order a 6 month mammogram per your note?

## 2021-04-01 NOTE — Telephone Encounter (Signed)
MMG is scheduled 04/20/2021 and then she is seeing NP with Dr. Darnelle Catalan.  Will await recommendations from oncology.  Ok to close communication.

## 2021-04-20 ENCOUNTER — Other Ambulatory Visit: Payer: Self-pay

## 2021-04-20 ENCOUNTER — Ambulatory Visit
Admission: RE | Admit: 2021-04-20 | Discharge: 2021-04-20 | Disposition: A | Discharge status: Home / Self Care | Payer: 59 | Source: Ambulatory Visit | Attending: Oncology | Admitting: Oncology

## 2021-04-20 DIAGNOSIS — Z9189 Other specified personal risk factors, not elsewhere classified: Secondary | ICD-10-CM

## 2021-04-20 DIAGNOSIS — Z1501 Genetic susceptibility to malignant neoplasm of breast: Secondary | ICD-10-CM

## 2021-04-20 DIAGNOSIS — Z1589 Genetic susceptibility to other disease: Secondary | ICD-10-CM

## 2021-04-20 DIAGNOSIS — Z1239 Encounter for other screening for malignant neoplasm of breast: Secondary | ICD-10-CM

## 2021-04-20 NOTE — Progress Notes (Signed)
[place]  [phone] [phone]    The following is a corrected copy of the original 10/30/2016 dictation   ID: Dominique Cannon DOB: November 27, 1985  MR#: [id number]  TWS#:[id number]  Patient Care Team: Dominique Noe, MD as PCP - General (Family Medicine) Dominique Cannon, Dominique Dad, MD as Consulting Physician (Oncology) Dominique Salon, MD as Consulting Physician (Gynecology) Fair, Marin Shutter, MD (Inactive) as Consulting Physician (Family Medicine)  OTHER MD:  CHIEF COMPLAINT: BRCA 2 positivity  CURRENT TREATMENT: Intensified screening   INTERVAL HISTORY: Dominique Cannon returns today for follow-up of her BRCA positivity.   Patient underwent bilateral breast mammogram yesterday, 04/20/2021 that has not yet been read.  She notes that she stopped breast feeding two months ago.  She has a 33 year old and 34 year old son and she says that for the past two months was the first time that she has not been either pregnant or breast feeding.  She denies any breast changes, but says that her breasts feel very different to her at this time period since everything that she has gone through.    REVIEW OF SYSTEMS: Review of Systems  Constitutional:  Negative for appetite change, chills, fatigue, fever and unexpected weight change.  HENT:   Negative for hearing loss, lump/mass and trouble swallowing.   Eyes:  Negative for eye problems and icterus.  Respiratory:  Negative for chest tightness, cough and shortness of breath.   Cardiovascular:  Negative for chest pain, leg swelling and palpitations.  Gastrointestinal:  Negative for abdominal distention, abdominal pain, constipation, diarrhea, nausea and vomiting.  Endocrine: Negative for hot flashes.  Genitourinary:  Negative for difficulty urinating.   Musculoskeletal:  Negative for arthralgias.  Skin:  Negative for itching and rash.  Neurological:  Negative for dizziness, extremity weakness, headaches and numbness.  Hematological:   Negative for adenopathy. Does not bruise/bleed easily.  Psychiatric/Behavioral:  Negative for depression. The patient is not nervous/anxious.      HISTORY OF PRESENT ILLNESS: From the original intake note:  Dominique Cannon has a significant family history suggesting a hereditary breast cancer syndrome and her gynecologist Dominique Cannon working with Dr. Quincy Cannon referred her for genetic evaluation. 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 The other 19 genes tested were unremarkable.  Her subsequent history is as detailed below   PAST MEDICAL HISTORY: Past Medical History:  Diagnosis Date   BRCA2 positive    Family history of breast cancer in mother    mother age 19 died of breast cancer   Family history of ovarian cancer     PAST SURGICAL HISTORY: Past Surgical History:  Procedure Laterality Date   DILATION AND EVACUATION N/A 01/16/2017   Procedure: DILATATION AND EVACUATION;  Surgeon: Dominique Salon, MD;  Location: [facility];  Service: Gynecology;  Laterality: N/A;   WISDOM TOOTH EXTRACTION      FAMILY HISTORY Family History  Problem Relation Age of Onset   Breast cancer Mother 59   Ovarian cancer Maternal Grandmother        dx in her 28s   Breast cancer Paternal Grandmother    Cancer Maternal Uncle        not sure what type   Other Other        positive genetic testing for "the gene"   Breast cancer Other        MGF's sister   Breast cancer Other  MGF's mother   The patient's mother died at the age of 31 from breast cancer. The patient's mother had one sister, in good health. The patient's mother's mother has a history of ovarian cancer. One maternal great aunt also had a history of breast cancer. A second has tested positive for a gene mutation, but we do not have those records. On the paternal side there is also a history of breast cancer, likely  postmenopausal   GYNECOLOGIC HISTORY:  Patient's last menstrual period was 04/09/2021. Menarche age 23, the patient is Dominique Cannon. She used a Mirena IUD for contraception.    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 Her husband, Dominique Cannon, is also a Art therapist, and band leader. Nydia gave birth to her son, Dominique Cannon, on 07/24/2018 and her second son Dominique Cannon on 02/05/2020.  They have not made any definite decisions regarding further family growth.    ADVANCED DIRECTIVES: In the absence of any documentation to the contrary, the patient's spouse is their HCPOA.    HEALTH MAINTENANCE: Social History   Tobacco Use   Smoking status: Never   Smokeless tobacco: Never  Vaping Use   Vaping Use: Never used  Substance Use Topics   Alcohol use: Not Currently    Comment: socially barely    Drug use: No     Colonoscopy: n/a (age)  PAP:  Bone density: n/a (age)  No Known Allergies  Current Outpatient Medications  Medication Sig Dispense Refill   PARAGARD INTRAUTERINE COPPER IU by Intrauterine route.     Prenatal Vit-Fe Fumarate-FA (PRENATAL VITAMIN PO) Take 1 tablet by mouth daily.      No current facility-administered medications for this visit.    OBJECTIVE: white woman in no acute distress  Vitals:   04/21/21 1319  BP: 118/71  Pulse: 85  Resp: 18  Temp: 97.7 F (36.5 C)  SpO2: 100%   Wt Readings from Last 3 Encounters:  04/21/21 124 lb 4.8 oz (56.4 kg)  02/15/21 123 lb (55.8 kg)  10/19/20 120 lb 12 oz (54.8 kg)   Body mass index is 18.9 kg/m.    ECOG FS:1 - Symptomatic but completely ambulatory GENERAL: Patient is a well appearing female in no acute distress HEENT:  Sclerae anicteric.  Oropharynx clear and moist. No ulcerations or evidence of oropharyngeal candidiasis. Neck is supple.  NODES:  No cervical, supraclavicular, or axillary lymphadenopathy palpated.  BREAST EXAM:  bilateral  breasts without nodules, masses or lesions, no sign of breast cancer on physical exam LUNGS:  Clear to auscultation bilaterally.  No wheezes or rhonchi. HEART:  Regular rate and rhythm. No murmur appreciated. ABDOMEN:  Soft, nontender.  Positive, normoactive bowel sounds. No organomegaly palpated. MSK:  No focal spinal tenderness to palpation. Full range of motion bilaterally in the upper extremities. EXTREMITIES:  No peripheral edema.   SKIN:  Clear with no obvious rashes or skin changes. No nail dyscrasia. NEURO:  Nonfocal. Well oriented.  Appropriate affect.    LAB RESULTS:  CMP     Component Value Date/Time   NA 136 08/04/2019 1444   K 3.8 08/04/2019 1444   CL 104 08/04/2019 1444   CO2 24 08/04/2019 1444   GLUCOSE 105 (H) 08/04/2019 1444   BUN 9 08/04/2019 1444   CREATININE 0.52 08/04/2019 1444   CALCIUM 9.4 08/04/2019 1444   PROT 7.0 08/04/2019 1444   ALBUMIN 4.1 08/04/2019 1444   AST 16 08/04/2019 1444  ALT 12 08/04/2019 1444   ALKPHOS 45 08/04/2019 1444   BILITOT 0.5 08/04/2019 1444   GFRNONAA >60 08/04/2019 1444   GFRAA >60 08/04/2019 1444    INo results found for: SPEP, UPEP  Lab Results  Component Value Date   WBC 12.1 (H) 02/06/2020   NEUTROABS 5.9 08/05/2019   HGB 9.3 (L) 02/06/2020   HCT 30.0 (L) 02/06/2020   MCV 82.9 02/06/2020   PLT 287 02/06/2020      Chemistry      Component Value Date/Time   NA 136 08/04/2019 1444   K 3.8 08/04/2019 1444   CL 104 08/04/2019 1444   CO2 24 08/04/2019 1444   BUN 9 08/04/2019 1444   CREATININE 0.52 08/04/2019 1444      Component Value Date/Time   CALCIUM 9.4 08/04/2019 1444   ALKPHOS 45 08/04/2019 1444   AST 16 08/04/2019 1444   ALT 12 08/04/2019 1444   BILITOT 0.5 08/04/2019 1444       No results found for: LABCA2  No components found for: LABCA125  No results for input(s): INR in the last 168 hours.  Urinalysis No results found for: COLORURINE, APPEARANCEUR, LABSPEC, PHURINE, GLUCOSEU, HGBUR,  BILIRUBINUR, KETONESUR, PROTEINUR, UROBILINOGEN, NITRITE, LEUKOCYTESUR  STUDIES: MM 3D SCREEN BREAST BILATERAL  Result Date: 04/21/2021 CLINICAL DATA:  High risk screening. Patient is BRCA2 positive and has a strong family history of breast cancer, including her mother who was diagnosed at age 70. EXAM: DIGITAL SCREENING BILATERAL MAMMOGRAM WITH TOMOSYNTHESIS AND CAD TECHNIQUE: Bilateral screening digital craniocaudal and mediolateral oblique mammograms were obtained. Bilateral screening digital breast tomosynthesis was performed. The images were evaluated with computer-aided detection. COMPARISON:  Previous exam(s). ACR Breast Density Category c: The breast tissue is heterogeneously dense, which may obscure small masses. FINDINGS: There are no findings suspicious for malignancy. The images were evaluated with computer-aided detection. IMPRESSION: No mammographic evidence of malignancy. A result letter of this screening mammogram will be mailed directly to the patient. RECOMMENDATION: 1.  Screening mammogram in one year.(Code:SM-B-01Y) 2. Given the patient's BRCA2 gene positivity, supplemental high risk screening MRI is also recommended. BI-RADS CATEGORY  1: Negative. Electronically Signed   By: Evangeline Dakin M.D.   On: 04/21/2021 13:51     ASSESSMENT: 35 y.o. BRCA 2 positive [place], [place] woman with brest density category D   (1) patient has decided against bilateral mastectomies and BSO, at least until child-bearing and nursing are complete  (2) patient opted against tamoxifen for risk reduction  (3) breast cancer screening: yearly mammogram and MRI, preferably 6 months apart to start at the end of breast-feeding, April 2022  (a) biannual breast exam   (4) ovarian cancer screening: pelvic US every 6 months with CA 125 with Dr. Sabra Heck   (A) every April and October   PLAN: Dominique Cannon is here today for f/u of her brca2 positivity undergoing every 6 month screening with mammogram and bilateral  breast MRI along with physcial exam.  She has no signs of breast cancer today.  She and I discussed the options for risk reduction surgery or tamoxifen, and she understands that if at any point she wants to proceed with any risk reduction she can let us know.    She is seeing Dr. Sabra Heck every 6 months for transvaginal ultrasound and CA 125.  She is following up with Dr. Sabra Heck on those results.    Dominique Cannon will return in 6 months for f/u with Dr. Jana Hakim.  She knows to call for any  questions that may arise between now and her next appointment.  We are happy to see her sooner if needed.   Total encounter time 20 minutes.* in face to face visit time, chart review, and documentation of the encounter.  Wilber Bihari, NP 04/21/21 11:15 PM Medical Oncology and Hematology [facility] [place], [place] [address] Tel. [phone]    Fax. [phone]   *Total Encounter Time as defined by the Centers for Medicare and Medicaid Services includes, in addition to the face-to-face time of a patient visit (documented in the note above) non-face-to-face time: obtaining and reviewing outside history, ordering and reviewing medications, tests or procedures, care coordination (communications with other health care professionals or caregivers) and documentation in the medical record.

## 2021-04-21 ENCOUNTER — Inpatient Hospital Stay: Payer: 59 | Attending: Adult Health | Admitting: Adult Health

## 2021-04-21 ENCOUNTER — Encounter: Payer: Self-pay | Admitting: Adult Health

## 2021-04-21 VITALS — BP 118/71 | HR 85 | Temp 97.7°F | Resp 18 | Ht 68.0 in | Wt 124.3 lb

## 2021-04-21 DIAGNOSIS — Z1509 Genetic susceptibility to other malignant neoplasm: Secondary | ICD-10-CM | POA: Insufficient documentation

## 2021-04-21 DIAGNOSIS — Z1371 Encounter for nonprocreative screening for genetic disease carrier status: Secondary | ICD-10-CM | POA: Diagnosis not present

## 2021-04-21 DIAGNOSIS — Z809 Family history of malignant neoplasm, unspecified: Secondary | ICD-10-CM | POA: Diagnosis not present

## 2021-04-21 DIAGNOSIS — Z803 Family history of malignant neoplasm of breast: Secondary | ICD-10-CM | POA: Diagnosis not present

## 2021-04-21 DIAGNOSIS — Z1501 Genetic susceptibility to malignant neoplasm of breast: Secondary | ICD-10-CM | POA: Diagnosis not present

## 2021-04-21 DIAGNOSIS — Z1231 Encounter for screening mammogram for malignant neoplasm of breast: Secondary | ICD-10-CM | POA: Insufficient documentation

## 2021-04-21 DIAGNOSIS — Z8041 Family history of malignant neoplasm of ovary: Secondary | ICD-10-CM | POA: Diagnosis not present

## 2021-04-21 DIAGNOSIS — Z1502 Genetic susceptibility to malignant neoplasm of ovary: Secondary | ICD-10-CM | POA: Insufficient documentation

## 2021-04-23 IMAGING — US US MFM OB FOLLOW-UP
1 series · 13 of 28 positions shown · non-contrast
Comparison: none

[Series 1: us mfm ob follow-up · 75 acquisitions, 13 frames shown]
[im 3/75]
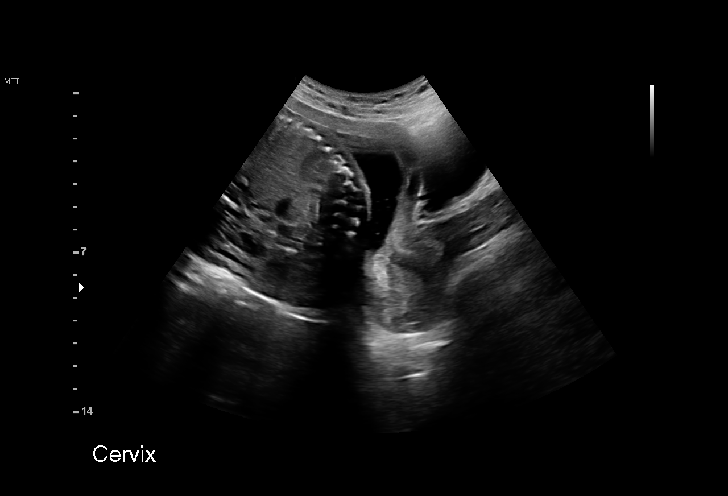
[im 9/75]
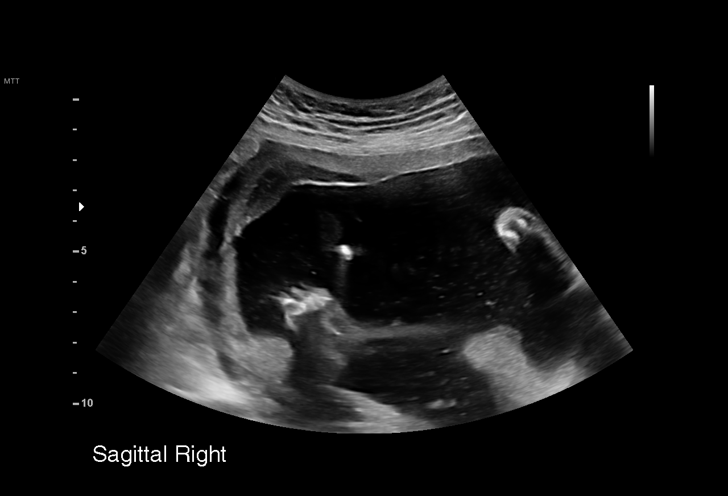
[im 14/75]
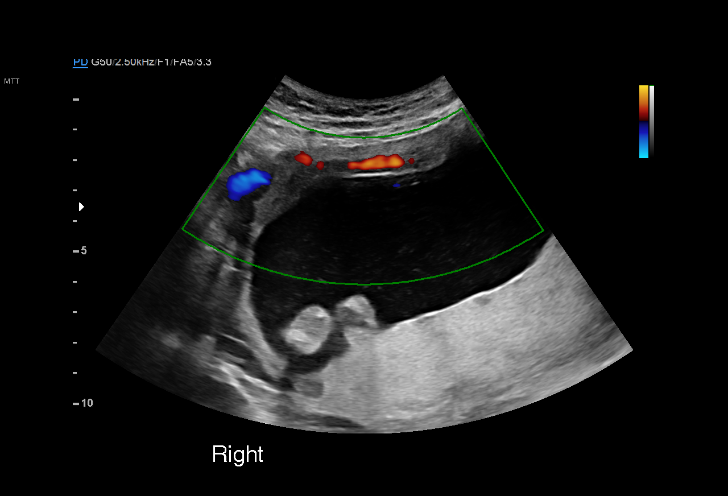
[im 20/75]
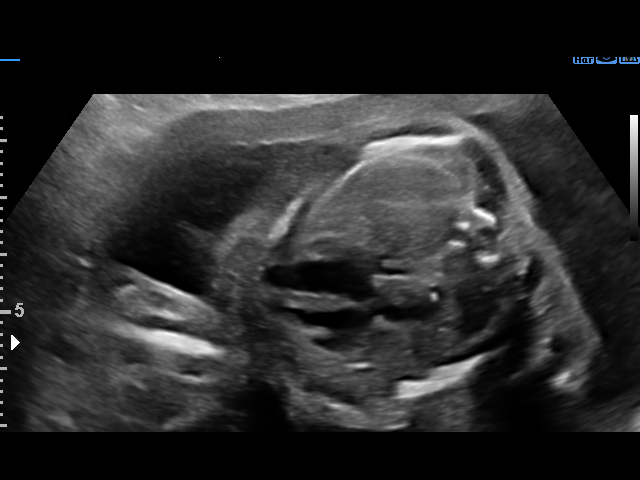
[im 25/75]
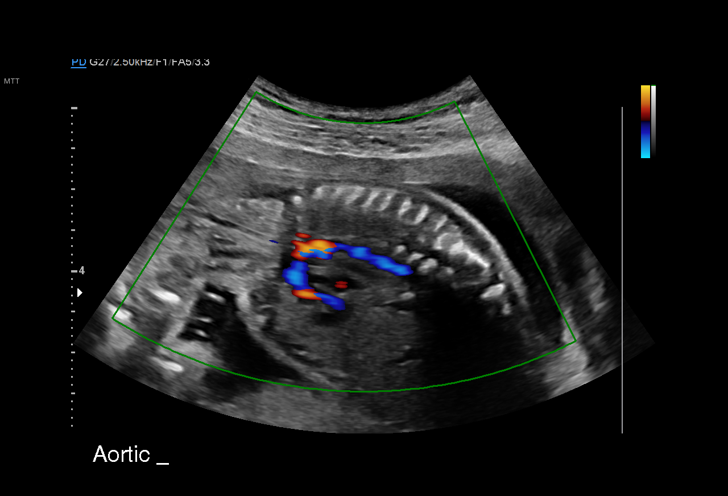
[im 31/75]
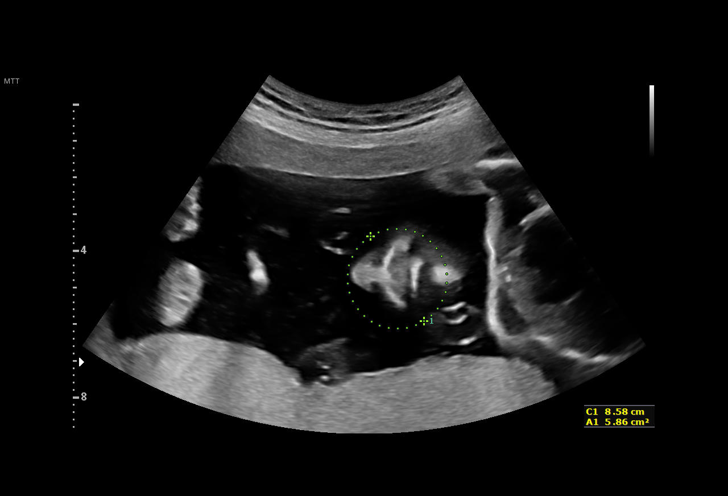
[im 39/75]
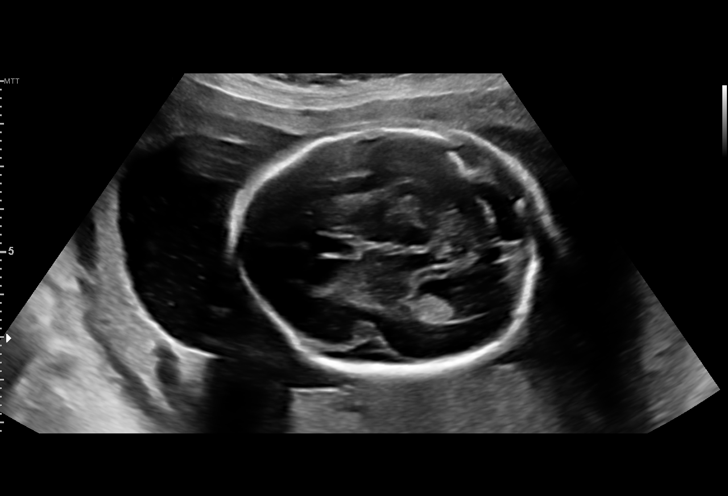
[im 44/75]
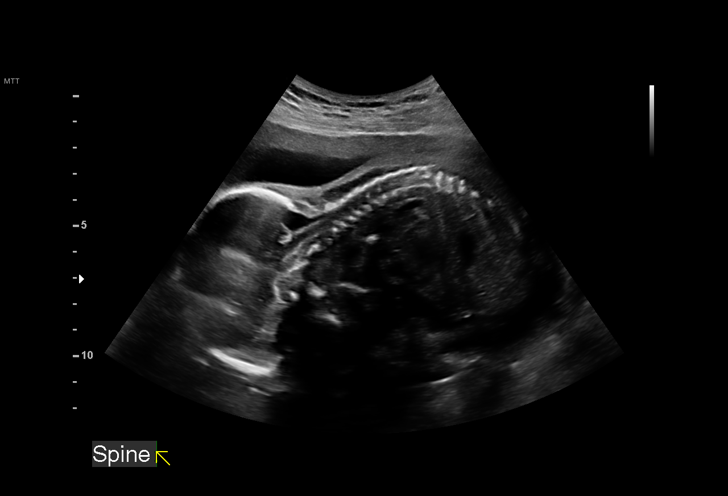
[im 50/75]
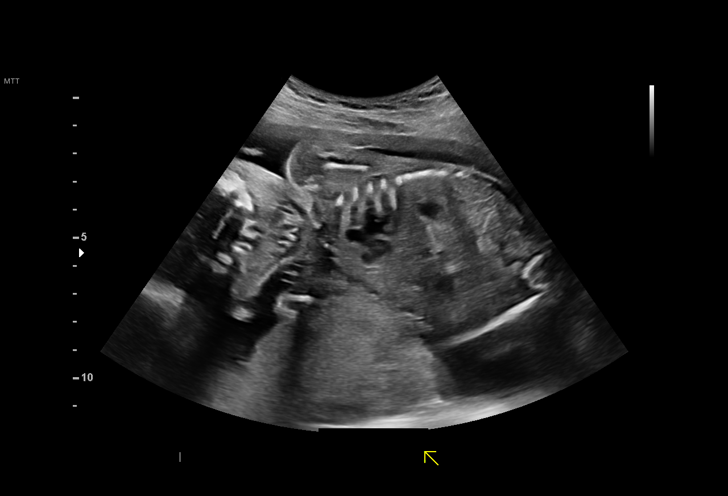
[im 55/75]
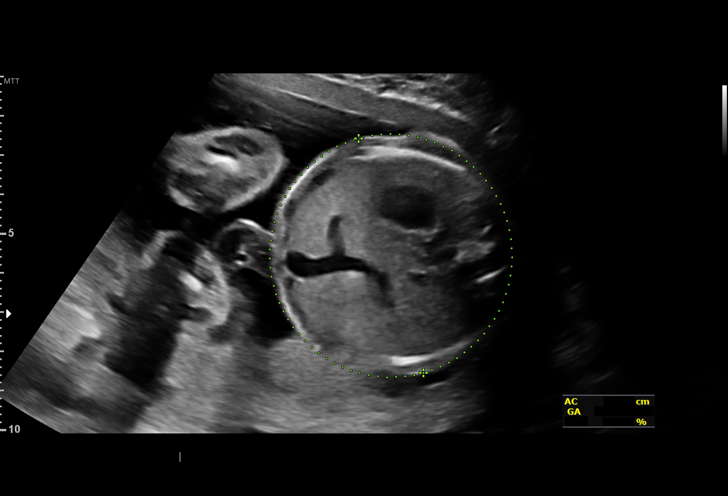
[im 61/75]
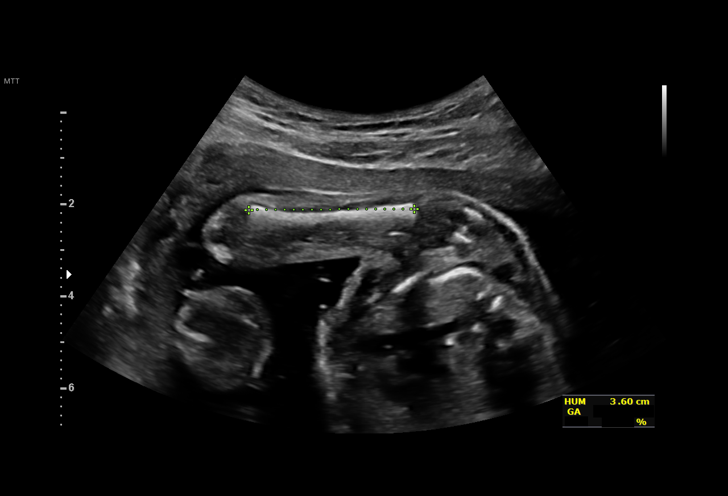
[im 66/75]
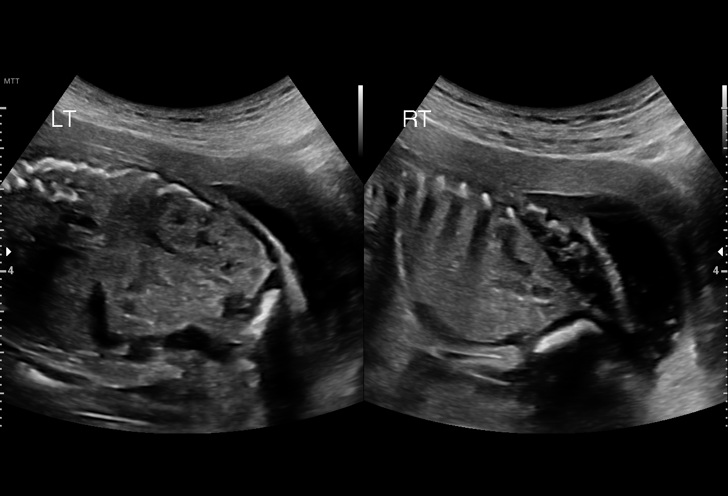
[im 72/75]
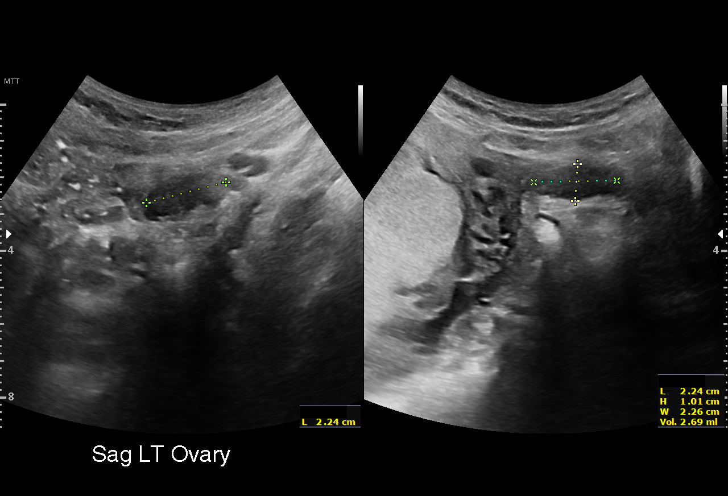

[13 of 28 positions shown; findings below may reference images not displayed]

----------------------------------------------------------------------

 ----------------------------------------------------------------------
Indications

  23 weeks gestation of pregnancy
  Antenatal screening for malformations
  Family history of genetic disorder
  Subchorionic hemorrhage, antepartum
 ----------------------------------------------------------------------
Fetal Evaluation

 Num Of Fetuses:         1
 Fetal Heart Rate(bpm):  145
 Cardiac Activity:       Observed
 Presentation:           Breech
 Placenta:               Posterior Fundal
 P. Cord Insertion:      Previously Visualized

 Amniotic Fluid
 AFI FV:      Within normal limits

                             Largest Pocket(cm)


 Comment:    Small subchorionic hemorrhage noted at Right fundus 5.0 x 1.1 x
             5.4 cm.
Biometry

 BPD:      55.5  mm     G. Age:  22w 6d         31  %    CI:        72.45   %    70 - 86
                                                         FL/HC:      18.7   %    19.2 -
 HC:      207.4  mm     G. Age:  22w 6d         19  %    HC/AC:      1.08        1.05 -
 AC:      191.7  mm     G. Age:  23w 6d         61  %    FL/BPD:     69.9   %    71 - 87
 FL:       38.8  mm     G. Age:  22w 3d         15  %    FL/AC:      20.2   %    20 - 24
 HUM:      36.2  mm     G. Age:  22w 5d         27  %
 CER:      25.6  mm     G. Age:  23w 4d         54  %
 LV:        7.3  mm

 Est. FW:     573  gm      1 lb 4 oz     38  %
OB History

 Gravidity:    4         Term:   1        Prem:   0        SAB:   2
 TOP:          0       Ectopic:  0        Living: 1
Gestational Age

 U/S Today:     23w 0d                                        EDD:   02/17/20
 Best:          23w 2d     Det. By:  Previous Ultrasound      EDD:   02/15/20
                                     (08/04/19)
Anatomy

 Cranium:               Appears normal         LVOT:                   Appears normal
 Cavum:                 Appears normal         Aortic Arch:            Appears normal
 Ventricles:            Appears normal         Ductal Arch:            Appears normal
 Choroid Plexus:        Previously seen        Diaphragm:              Appears normal
 Cerebellum:            Appears normal         Stomach:                Appears normal, left
                                                                       sided
 Posterior Fossa:       Previously seen        Abdomen:                Previously seen
 Nuchal Fold:           Previously seen        Abdominal Wall:         Previously seen
 Face:                  Orbits and profile     Cord Vessels:           Previously seen
                        previously seen
 Lips:                  Appears normal         Kidneys:                Appear normal
 Palate:                Previously seen        Bladder:                Appears normal
 Thoracic:              Appears normal         Spine:                  Previously seen
 Heart:                 Appears normal         Upper Extremities:      Previously seen
                        (4CH, axis, and
                        situs)
 RVOT:                  Appears normal         Lower Extremities:      Previously seen

 Other:  Male gender previously seen. Heels and 5th digit previously seen.
         Technically difficult due to fetal position.
Cervix Uterus Adnexa

 Cervix
 Length:              3  cm.
 Normal appearance by transabdominal scan.

 Uterus
 No abnormality visualized.

 Left Ovary
 Within normal limits. No adnexal mass visualized.

 Right Ovary
 Within normal limits. No adnexal mass visualized.

 Cul De Sac
 No free fluid seen.

 Adnexa
 No abnormality visualized.
Impression

 Normal interval growth.
 Subchorionic hemorrhage again seen appears decreased in
 sized
 Good fetal movement and amniotic fluid.
 Ms Lishon has no symptoms.
Recommendations

 Follow up as clinically indicated.

## 2021-04-26 ENCOUNTER — Telehealth: Payer: Self-pay | Admitting: Oncology

## 2021-04-26 NOTE — Telephone Encounter (Signed)
Scheduled per 6/9 los. Called pt and left a msg  

## 2021-08-17 ENCOUNTER — Telehealth (HOSPITAL_BASED_OUTPATIENT_CLINIC_OR_DEPARTMENT_OTHER): Payer: Self-pay | Admitting: Obstetrics & Gynecology

## 2021-08-17 NOTE — Telephone Encounter (Signed)
Per visit note with Dr. Hyacinth Meeker in April 2022, pt is to follow up in November or after. Scheduler to call and make appt for pt

## 2021-08-17 NOTE — Telephone Encounter (Signed)
Patient called and said she is suppose see you twice a year ?Patient was last seen on 02/15/2021 for her annual visit .

## 2021-08-19 NOTE — Telephone Encounter (Signed)
Patient has an appointment scheduled for 09/06/2021 @8 :15. tbw

## 2021-09-06 ENCOUNTER — Encounter (HOSPITAL_BASED_OUTPATIENT_CLINIC_OR_DEPARTMENT_OTHER): Payer: Self-pay | Admitting: Obstetrics & Gynecology

## 2021-09-06 ENCOUNTER — Other Ambulatory Visit: Payer: Self-pay

## 2021-09-06 ENCOUNTER — Ambulatory Visit (INDEPENDENT_AMBULATORY_CARE_PROVIDER_SITE_OTHER): Payer: 59 | Admitting: Obstetrics & Gynecology

## 2021-09-06 VITALS — BP 116/82 | HR 71 | Ht 68.0 in | Wt 131.2 lb

## 2021-09-06 DIAGNOSIS — Z23 Encounter for immunization: Secondary | ICD-10-CM

## 2021-09-06 DIAGNOSIS — Z9189 Other specified personal risk factors, not elsewhere classified: Secondary | ICD-10-CM

## 2021-09-06 DIAGNOSIS — Z1501 Genetic susceptibility to malignant neoplasm of breast: Secondary | ICD-10-CM

## 2021-09-06 DIAGNOSIS — Z01419 Encounter for gynecological examination (general) (routine) without abnormal findings: Secondary | ICD-10-CM | POA: Diagnosis not present

## 2021-09-06 DIAGNOSIS — Z1509 Genetic susceptibility to other malignant neoplasm: Secondary | ICD-10-CM

## 2021-09-06 DIAGNOSIS — Z124 Encounter for screening for malignant neoplasm of cervix: Secondary | ICD-10-CM | POA: Diagnosis not present

## 2021-09-06 NOTE — Progress Notes (Signed)
35 y.o. [id number] Married White or Caucasian female here for annual exam.  Cycles are still heavier and longer with current IUD.  This is a copper IUD.  Cramping has actually improved.  Paragard placed 03/10/2020 at post partum appointment.    No LMP recorded. (Menstrual status: IUD).          Sexually active: Yes.    The current method of family planning is IUD.    Exercising: Yes.     Walking with stroller Smoker:  no  Health Maintenance: Pap:  09/20/2018 Negative History of abnormal Pap:  no MMG:  04/20/2021 Recommended MRI Colonoscopy:  guidelines reviewed Screening Labs: not indicated   reports that she has never smoked. She has never used smokeless tobacco. She reports that she does not currently use alcohol. She reports that she does not use drugs.  Past Medical History:  Diagnosis Date   BRCA2 positive    Family history of breast cancer in mother    mother age 33 died of breast cancer   Family history of ovarian cancer     Past Surgical History:  Procedure Laterality Date   DILATION AND EVACUATION N/A 01/16/2017   Procedure: DILATATION AND EVACUATION;  Surgeon: Megan Salon, MD;  Location: [facility];  Service: Gynecology;  Laterality: N/A;   WISDOM TOOTH EXTRACTION      Current Outpatient Medications  Medication Sig Dispense Refill   PARAGARD INTRAUTERINE COPPER IU by Intrauterine route.     Prenatal Vit-Fe Fumarate-FA (PRENATAL VITAMIN PO) Take 1 tablet by mouth daily.  (Patient not taking: Reported on 09/06/2021)     No current facility-administered medications for this visit.    Family History  Problem Relation Age of Onset   Breast cancer Mother 66   Ovarian cancer Maternal Grandmother        dx in her 42s   Breast cancer Paternal Grandmother    Cancer Maternal Uncle        not sure what type   Other Other        positive genetic testing for "the gene"   Breast cancer Other        MGF's sister   Breast cancer Other        MGF's mother    Review of Systems   All other systems reviewed and are negative.  Exam:   BP 116/82 (BP Location: Right Arm, Patient Position: Sitting, Cuff Size: Normal)   Pulse 71   Ht $R'5\' 8"'UE$  (1.727 m)   Wt 131 lb 3.2 oz (59.5 kg)   BMI 19.95 kg/m   Height: $Remove'5\' 8"'GwZaLMe$  (172.7 cm)  General appearance: alert, cooperative and appears stated age Head: Normocephalic, without obvious abnormality, atraumatic Neck: no adenopathy, supple, symmetrical, trachea midline and thyroid normal to inspection and palpation Lungs: clear to auscultation bilaterally Breasts: normal appearance, no masses or tenderness Heart: regular rate and rhythm Abdomen: soft, non-tender; bowel sounds normal; no masses,  no organomegaly Extremities: extremities normal, atraumatic, no cyanosis or edema Skin: Skin color, texture, turgor normal. No rashes or lesions Lymph nodes: Cervical, supraclavicular, and axillary nodes normal. No abnormal inguinal nodes palpated Neurologic: Grossly normal   Pelvic: External genitalia:  no lesions              Urethra:  normal appearing urethra with no masses, tenderness or lesions              Bartholins and Skenes: normal  Vagina: normal appearing vagina with normal color and no discharge, no lesions              Cervix: no lesions              Pap taken: Yes.   Bimanual Exam:  Uterus:  normal size, contour, position, consistency, mobility, non-tender              Adnexa: normal adnexa and no mass, fullness, tenderness               Rectovaginal: Confirms               Anus:  normal sphincter tone, no lesions  Chaperone, Octaviano Batty, CMA, was present for exam.  Assessment/Plan: 1. Well woman exam with routine gynecological exam - pap and HR HPV obtained today - MMG and breast MRI on every 6 months.  Followed by Dr. Jana Hakim. - colon cancer screening guidelines reviewed - care gaps updated/reviewed   2. Cervical cancer screening - Cytology - PAP( [facility])  3. BRCA2 gene mutation  positive - doing ca-125 and ultrasounds on every 6 months interval - US PELVIS TRANSVAGINAL NON-OB (TV ONLY); Future - CA 125  4. At high risk for breast cancer  5. High risk of ovarian cancer

## 2021-09-07 LAB — CA 125: Cancer Antigen (CA) 125: 25.1 U/mL (ref 0.0–38.1)

## 2021-09-09 LAB — CYTOLOGY - PAP
Comment: NEGATIVE
High risk HPV: NEGATIVE

## 2021-09-21 ENCOUNTER — Ambulatory Visit (INDEPENDENT_AMBULATORY_CARE_PROVIDER_SITE_OTHER): Payer: 59 | Admitting: Obstetrics & Gynecology

## 2021-09-21 ENCOUNTER — Ambulatory Visit (INDEPENDENT_AMBULATORY_CARE_PROVIDER_SITE_OTHER): Payer: 59

## 2021-09-21 ENCOUNTER — Other Ambulatory Visit: Payer: Self-pay

## 2021-09-21 DIAGNOSIS — Z8041 Family history of malignant neoplasm of ovary: Secondary | ICD-10-CM | POA: Diagnosis not present

## 2021-09-21 DIAGNOSIS — Z1509 Genetic susceptibility to other malignant neoplasm: Secondary | ICD-10-CM

## 2021-09-21 DIAGNOSIS — Z9189 Other specified personal risk factors, not elsewhere classified: Secondary | ICD-10-CM

## 2021-09-21 DIAGNOSIS — Z1501 Genetic susceptibility to malignant neoplasm of breast: Secondary | ICD-10-CM

## 2021-09-21 DIAGNOSIS — Z803 Family history of malignant neoplasm of breast: Secondary | ICD-10-CM | POA: Diagnosis not present

## 2021-09-21 DIAGNOSIS — R87619 Unspecified abnormal cytological findings in specimens from cervix uteri: Secondary | ICD-10-CM

## 2021-09-24 ENCOUNTER — Encounter (HOSPITAL_BASED_OUTPATIENT_CLINIC_OR_DEPARTMENT_OTHER): Payer: Self-pay | Admitting: Obstetrics & Gynecology

## 2021-09-24 NOTE — Progress Notes (Signed)
GYNECOLOGY  VISIT  CC:   discuss recent pap smear and ultrasound  HPI: 35 y.o. G4P2022 Married White or Caucasian female here for discussion of ultrasound results.  Pt has hx of BRCA 2 gene mutation.  Uterus and ovaries normal.  IUD was in correct location.  Ca-125 on 09/06/2021 was normal at 25.  Pap obtained 09/06/2021 showed atypical glandular cells, NOS.  HR HPV testing was negative.  Comment with pap states atypical cells are likely reactive due to presence of IUD.  However, recommend ECC for additional evaluation.  Pt cannot stay for this today and desires to return.  Asks for office to call to schedule.  GYNECOLOGIC HISTORY: No LMP recorded. (Menstrual status: IUD). Contraception: Paragaurd IUD  Patient Active Problem List   Diagnosis Date Noted  . Breast cancer screening, high risk patient 03/03/2020  . At high risk for breast cancer 03/03/2020  . High risk of ovarian cancer 03/03/2020  . History of shoulder dystocia in prior pregnancy, currently pregnant 12/16/2019  . 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 Family history of breast cancer     Past Medical History:  Diagnosis Date  . BRCA2 positive   . Family history of breast cancer in mother    mother age 63 died of breast cancer  . Family history of ovarian cancer     Past Surgical History:  Procedure Laterality Date  . DILATION AND EVACUATION N/A 01/16/2017   Procedure: DILATATION AND EVACUATION;  Surgeon: Megan Salon, MD;  Location: [facility];  Service: Gynecology;  Laterality: N/A;  . WISDOM TOOTH EXTRACTION      MEDS:   Current Outpatient Medications on File Prior to Visit  Medication Sig Dispense Refill  . PARAGARD INTRAUTERINE COPPER IU by Intrauterine route.     No current facility-administered medications on file prior to visit.    ALLERGIES: Patient has no known allergies.  Family History  Problem Relation Age of Onset  . Breast cancer Mother 28  . Ovarian  cancer Maternal Grandmother        dx in her 27s  . Breast cancer Paternal Grandmother   . Cancer Maternal Uncle        not sure what type  . Other Other        positive genetic testing for "the gene"  . Breast cancer Other        MGF's sister  . Breast cancer Other        MGF's mother    SH:  married, non smoker  Review of Systems  All other systems reviewed and are negative.  PHYSICAL EXAMINATION:    General appearance: alert, cooperative and appears stated age No other exam performed   Assessment/Plan: 1. BRCA2 gene mutation positive - normal u/s today.  Repeat 6 months.    2. High risk of ovarian cancer  3. At high risk for breast cancer  4. Atypical glandular cells of undetermined significance (AGUS) on cervical Pap smear - will return for additional evaluation

## 2021-10-04 ENCOUNTER — Encounter (HOSPITAL_BASED_OUTPATIENT_CLINIC_OR_DEPARTMENT_OTHER): Payer: Self-pay | Admitting: Obstetrics & Gynecology

## 2021-10-04 ENCOUNTER — Ambulatory Visit (INDEPENDENT_AMBULATORY_CARE_PROVIDER_SITE_OTHER): Payer: 59 | Admitting: Obstetrics & Gynecology

## 2021-10-04 ENCOUNTER — Other Ambulatory Visit: Payer: Self-pay

## 2021-10-04 ENCOUNTER — Other Ambulatory Visit (HOSPITAL_COMMUNITY)
Admission: RE | Admit: 2021-10-04 | Discharge: 2021-10-04 | Disposition: A | Discharge status: Home / Self Care | Payer: 59 | Source: Ambulatory Visit | Attending: Obstetrics & Gynecology | Admitting: Obstetrics & Gynecology

## 2021-10-04 VITALS — BP 126/85 | HR 82 | Ht 68.0 in | Wt 129.0 lb

## 2021-10-04 DIAGNOSIS — R87619 Unspecified abnormal cytological findings in specimens from cervix uteri: Secondary | ICD-10-CM

## 2021-10-05 NOTE — Progress Notes (Signed)
35 y.o. [id number] Married female here for colposcopy with possible biopsies and/or ECC due to atypical glandular cells, NOS, with neg HR HPV noted on pap smear obtained 09/06/2021.  Pap smear notation states this is likely reactive in nature given IUD.  With current IUD, does have longer and heavier cycles with some irregular bleeding.     Prior evaluation/treatment:  none.  No LMP recorded. (Menstrual status: IUD).          Sexually active: Yes.    The current method of family planning is IUD.     Patient has been counseled about results and procedure.  Risks and benefits have bene reviewed including immediate and/or delayed bleeding, infection, cervical scaring from procedure, possibility of needing additional follow up as well as treatment.  Rare risks of missing a lesion discussed as well.  All questions answered.  Pt ready to proceed.  Consent obtained.  BP 126/85 (BP Location: Left Arm, Patient Position: Sitting, Cuff Size: Normal)   Pulse 82   Ht 5\' 8"  (1.727 m)   Wt 129 lb (58.5 kg)   BMI 19.61 kg/m   General appearance: alert, cooperative and appears stated age Lymph nodes: No abnormal inguinal nodes palpated Neurologic: Grossly normal  Pelvic: External genitalia:  no lesions              Urethra:  normal appearing urethra with no masses, tenderness or lesions              Bartholins and Skenes: normal                 Vagina: normal appearing vagina with normal color and no discharge, no lesions               Physical Exam  Speculum placed.  3% acetic acid applied to cervix for >45 seconds.  Cervix visualized with both 7.5X and 15X magnification.  Green filter also used.  Lugols solution was used.  Findings:  no abnormal AWE noted and no abnormal staining with lugols solution.  Biopsy:  not obtained.  ECC:  was performed.  Monsel's was not needed.  Then cervix was cleansed with betadine x 3.  Anterior lip of cervix was grasped with single toothed tenaculum.  Endometrial pipelle  passed to fundus.  Suction applied and sample obtained.  Sample was small so this was obtained twice.  Pipelle removed.  Tenaculum removed.  Minimal bleeding not.  Pt tolerated procedures well.  Chaperone, , CMA, was present during procedure.  Assessment/Plan: 1. Atypical glandular cells of undetermined significance of cervix - Surgical pathology( [facility]/ [facility])  - Pathology results will be called to patient and follow-up planned pending results.

## 2021-10-07 LAB — SURGICAL PATHOLOGY

## 2021-10-12 ENCOUNTER — Encounter (HOSPITAL_BASED_OUTPATIENT_CLINIC_OR_DEPARTMENT_OTHER): Payer: Self-pay | Admitting: *Deleted

## 2021-10-17 ENCOUNTER — Ambulatory Visit (HOSPITAL_COMMUNITY): Admission: RE | Admit: 2021-10-17 | Payer: 59 | Source: Ambulatory Visit

## 2021-10-19 ENCOUNTER — Other Ambulatory Visit: Payer: Self-pay

## 2021-10-19 ENCOUNTER — Ambulatory Visit (HOSPITAL_COMMUNITY)
Admission: RE | Admit: 2021-10-19 | Discharge: 2021-10-19 | Disposition: A | Discharge status: Home / Self Care | Payer: 59 | Source: Ambulatory Visit | Attending: Adult Health | Admitting: Adult Health

## 2021-10-19 DIAGNOSIS — Z1501 Genetic susceptibility to malignant neoplasm of breast: Secondary | ICD-10-CM | POA: Insufficient documentation

## 2021-10-19 DIAGNOSIS — Z1509 Genetic susceptibility to other malignant neoplasm: Secondary | ICD-10-CM | POA: Insufficient documentation

## 2021-10-19 MED ORDER — GADOBUTROL 1 MMOL/ML IV SOLN
6.0000 mL | Freq: Once | INTRAVENOUS | Status: AC | PRN
  Administered 2021-10-19: 6 mL via INTRAVENOUS

## 2021-10-19 NOTE — Progress Notes (Signed)
[facility]  Telephone:([phone] Fax:([phone]    ID: Dominique Cannon DOB: 04-May-1986  MR#: [id number]  DHW#:[id number]  Patient Care Team: Lesleigh Noe, MD as PCP - General (Family Medicine) Eliyas Suddreth, Virgie Dad, MD as Consulting Physician (Oncology) Megan Salon, MD as Consulting Physician (Gynecology) Fair, Marin Shutter, MD (Inactive) as Consulting Physician (Family Medicine)  OTHER MD:  CHIEF COMPLAINT: BRCA 2 positivity  CURRENT TREATMENT: Intensified screening   INTERVAL HISTORY: Dominique Cannon returns today for follow-up of her BRCA positivity.   She underwent breast MRI yesterday, 10/19/2021, showing: breast composition D; no evidence of malignancy in either breast.  She had her most recent mammogram 04/21/2021.  At that time breast density was read as category C.  Of note, she also underwent colposcopy with endometrial biopsy on 10/04/2021 with Dr. Sabra Heck. Pathology from the procedure ([id number]) was benign.   REVIEW OF SYSTEMS: Dominique Cannon is now working from home as is her husband.  They are mutually taking care of the children were 28 and 36-year-old.  She exercises by pushing the stroller outside as much as possible and also does some yoga.  A detailed review of systems was otherwise noncontributory   COVID 19 VACCINATION STATUS: [place] x3, last 08/2021   HISTORY OF PRESENT ILLNESS: From the original intake note:  Dominique Cannon has a significant family history suggesting a hereditary breast cancer syndrome and her gynecologist [name] working with Dr. [name] referred her for genetic evaluation. 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 The other 19 genes tested were unremarkable.  Her subsequent history is as detailed below   PAST MEDICAL HISTORY: Past Medical History:  Diagnosis Date   BRCA2 positive    Family history of breast cancer in mother     mother age 20 died of breast cancer   Family history of ovarian cancer     PAST SURGICAL HISTORY: Past Surgical History:  Procedure Laterality Date   DILATION AND EVACUATION N/A 01/16/2017   Procedure: DILATATION AND EVACUATION;  Surgeon: Megan Salon, MD;  Location: [facility];  Service: Gynecology;  Laterality: N/A;   WISDOM TOOTH EXTRACTION      FAMILY HISTORY Family History  Problem Relation Age of Onset   Breast cancer Mother 78   Ovarian cancer Maternal Grandmother        dx in her 42s   Breast cancer Paternal Grandmother    Cancer Maternal Uncle        not sure what type   Other Other        positive genetic testing for "the gene"   Breast cancer Other        MGF's sister   Breast cancer Other        MGF's mother  The patient's mother died at the age of 57 from breast cancer. The patient's mother had one sister, in good health. The patient's mother's mother has a history of ovarian cancer. One maternal great aunt also had a history of breast cancer. A second has tested positive for a gene mutation, but we do not have those records. On the paternal side there is also a history of breast cancer, likely postmenopausal   GYNECOLOGIC HISTORY:  No LMP recorded. (Menstrual status: IUD). Menarche age 29, the patient is Dominique Cannon. She uses anIUD for contraception.    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  Was. Her husband, Dian Situ, is also a Art therapist, and band leader but is currently working for [employer] from home.  Dominique Cannon gave birth to her son, Dominique Cannon, on 07/24/2018 and her second son Dominique Cannon on 02/05/2020.  They have not made any definite decisions regarding further family growth    ADVANCED DIRECTIVES: In the absence of any documentation to the contrary, the patient's spouse is their HCPOA.    HEALTH MAINTENANCE: Social History   Tobacco Use   Smoking  status: Never   Smokeless tobacco: Never  Vaping Use   Vaping Use: Never used  Substance Use Topics   Alcohol use: Not Currently    Comment: socially barely    Drug use: No     Colonoscopy: n/a (age)  PAP:  Bone density: n/a (age)  No Known Allergies  Current Outpatient Medications  Medication Sig Dispense Refill   PARAGARD INTRAUTERINE COPPER IU by Intrauterine route.     No current facility-administered medications for this visit.    OBJECTIVE: white woman who appears well  Vitals:   10/20/21 1408  BP: 121/75  Pulse: 80  Resp: 16  Temp: 98.1 F (36.7 C)  SpO2: 100%   Wt Readings from Last 3 Encounters:  10/20/21 132 lb 12.8 oz (60.2 kg)  10/04/21 129 lb (58.5 kg)  09/06/21 131 lb 3.2 oz (59.5 kg)   Body mass index is 20.19 kg/m.    ECOG FS:1 - Symptomatic but completely ambulatory  Sclerae unicteric, EOMs intact Wearing a mask No cervical or supraclavicular adenopathy Lungs no rales or rhonchi Heart regular rate and rhythm Abd soft, nontender, positive bowel sounds MSK no focal spinal tenderness, no upper extremity lymphedema Neuro: nonfocal, well oriented, appropriate affect Breasts: No suspicious masses palpated, no skin or nipple changes of concern, both axillae are benign   LAB RESULTS:  CMP     Component Value Date/Time   NA 136 08/04/2019 1444   K 3.8 08/04/2019 1444   CL 104 08/04/2019 1444   CO2 24 08/04/2019 1444   GLUCOSE 105 (H) 08/04/2019 1444   BUN 9 08/04/2019 1444   CREATININE 0.52 08/04/2019 1444   CALCIUM 9.4 08/04/2019 1444   PROT 7.0 08/04/2019 1444   ALBUMIN 4.1 08/04/2019 1444   AST 16 08/04/2019 1444   ALT 12 08/04/2019 1444   ALKPHOS 45 08/04/2019 1444   BILITOT 0.5 08/04/2019 1444   GFRNONAA >60 08/04/2019 1444   GFRAA >60 08/04/2019 1444    INo results found for: SPEP, UPEP  Lab Results  Component Value Date   WBC 12.1 (H) 02/06/2020   NEUTROABS 5.9 08/05/2019   HGB 9.3 (L) 02/06/2020   HCT 30.0 (L)  02/06/2020   MCV 82.9 02/06/2020   PLT 287 02/06/2020      Chemistry      Component Value Date/Time   NA 136 08/04/2019 1444   K 3.8 08/04/2019 1444   CL 104 08/04/2019 1444   CO2 24 08/04/2019 1444   BUN 9 08/04/2019 1444   CREATININE 0.52 08/04/2019 1444      Component Value Date/Time   CALCIUM 9.4 08/04/2019 1444   ALKPHOS 45 08/04/2019 1444   AST 16 08/04/2019 1444   ALT 12 08/04/2019 1444   BILITOT 0.5 08/04/2019 1444       No results found for: LABCA2  No components found for: LABCA125  No results for input(s): INR in the last 168 hours.  Urinalysis  No results found for: COLORURINE, APPEARANCEUR, LABSPEC, [place], GLUCOSEU, HGBUR, BILIRUBINUR, KETONESUR, PROTEINUR, UROBILINOGEN, NITRITE, LEUKOCYTESUR   STUDIES: US PELVIS TRANSVAGINAL NON-OB (TV ONLY)  Result Date: 09/22/2021 CLINICAL DATA:  BRCA 2 gene positive, family history of breast cancer  EXAM: TRANSVAGINAL ULTRASOUND OF PELVIS  TECHNIQUE: Transvaginal ultrasound examination of the pelvis was performed.  FINDINGS: Uterus: 7.11 x 4.31 x 5.96cm.  Volume: 95.67ml.  Anteflexed without abnormalities.  Endometrial thickness:  8.75mm, trilaminar with IUD in correct location.  Right ovary:  2.81 x 1.5 x 1.57cm.  Volume:  3.28ml. Left ovary:  2.87 x 1.33 x 2.65cm.  Volume:  5.56ml.  Both ovaries and adnexal appear normal.  Other findings:  No free fluid in PCDS.  MR BREAST BILATERAL W WO CONTRAST INC CAD  Result Date: 10/19/2021 CLINICAL DATA:  35 year old who carries the BRCA 2 gene mutation, family history of breast cancer in her mother diagnosed at age 50, family history of ovarian cancer. Supplemental high-risk screening MRI. EXAM: BILATERAL BREAST MRI WITH AND WITHOUT CONTRAST TECHNIQUE: Multiplanar, multisequence MR images of both breasts were obtained prior to and following the intravenous administration of 6 ml of Gadavist. Three-dimensional MR images were rendered by post-processing of the original MR data on an  independent workstation. The three-dimensional MR images were interpreted, and findings are reported in the following complete MRI report for this study. Three dimensional images were evaluated at the independent interpreting workstation using the DynaCAD thin client. COMPARISON:  Previous exam(s). FINDINGS: Breast composition: d. Extreme fibroglandular tissue. Background parenchymal enhancement: Moderate. Right breast: No suspicious mass or abnormal enhancement. Left breast: No suspicious mass or abnormal enhancement. Lymph nodes: No pathologic lymphadenopathy. Ancillary findings:  None. IMPRESSION: No MRI evidence of malignancy involving either breast. RECOMMENDATION: 1. Annual screening mammography which is due in June, 2023. 2. Supplemental high-risk screening MRI in 1 year. BI-RADS CATEGORY  1: Negative. Electronically Signed   By: Evangeline Dakin M.D.   On: 10/19/2021 11:48    ASSESSMENT: 35 y.o. BRCA 2 positive Dominique Cannon, [place] woman with brest density category D   (1) patient has decided against bilateral mastectomies and BSO, at least until child-bearing and nursing are complete  (2) patient opted against tamoxifen for risk reduction  (3) breast cancer screening: yearly mammogram and MRI, preferably 6 months apart to start at the end of breast-feeding, April 2022  (a) biannual breast exam   (4) ovarian cancer screening: pelvic US every 6 months with CA 125 with Dr. Sabra Heck      PLAN: Dominique Cannon has a very good understanding of the surveillance plan and she is following it without difficulty.  Specifically she sees Dr. Sabra Heck every 6 months for the ovarian screening and at some point she will undergo bilateral salpingo-oophorectomy.  She will have mammography in June and then breast MRI in December.  We discussed the fact that people without insurance and are paying a lot more not simply because they do not have insurance but also because they get billed for cost whereas insurance companies  generally negotiate prices down.  She does have insurance and is planning to get insurance but just in case there is an issue with that next year I gave her the name of our navigators and she if necessary can request some assistance particularly with mammography.  Otherwise says she will see [place] again in 1 year.  She knows to call for any other issue that may develop before then.  Total encounter time 20 minutes.Sarajane Jews C. Chavis Tessler,  MD 10/20/21 2:39 PM Medical Oncology and Hematology [facility] [place], [place] [address] Tel. [phone]    Fax. [phone]   I, Wilburn Mylar, am acting as scribe for Dr. Virgie Dad. Marcin Holte.  I, Lurline Del MD, have reviewed the above documentation for accuracy and completeness, and I agree with the above.    *Total Encounter Time as defined by the Centers for Medicare and Medicaid Services includes, in addition to the face-to-face time of a patient visit (documented in the note above) non-face-to-face time: obtaining and reviewing outside history, ordering and reviewing medications, tests or procedures, care coordination (communications with other health care professionals or caregivers) and documentation in the medical record.

## 2021-10-20 ENCOUNTER — Inpatient Hospital Stay: Payer: 59 | Attending: Oncology | Admitting: Oncology

## 2021-10-20 ENCOUNTER — Telehealth: Payer: Self-pay

## 2021-10-20 VITALS — BP 121/75 | HR 80 | Temp 98.1°F | Resp 16 | Ht 68.0 in | Wt 132.8 lb

## 2021-10-20 DIAGNOSIS — Z1501 Genetic susceptibility to malignant neoplasm of breast: Secondary | ICD-10-CM | POA: Diagnosis not present

## 2021-10-20 DIAGNOSIS — Z8041 Family history of malignant neoplasm of ovary: Secondary | ICD-10-CM | POA: Insufficient documentation

## 2021-10-20 DIAGNOSIS — Z803 Family history of malignant neoplasm of breast: Secondary | ICD-10-CM | POA: Insufficient documentation

## 2021-10-20 DIAGNOSIS — Z9189 Other specified personal risk factors, not elsewhere classified: Secondary | ICD-10-CM

## 2021-10-20 DIAGNOSIS — Z1502 Genetic susceptibility to malignant neoplasm of ovary: Secondary | ICD-10-CM | POA: Diagnosis not present

## 2021-10-20 NOTE — Telephone Encounter (Signed)
Attempt to call pt regarding results from MRI, no voicemail available

## 2021-10-22 ENCOUNTER — Other Ambulatory Visit (HOSPITAL_BASED_OUTPATIENT_CLINIC_OR_DEPARTMENT_OTHER): Payer: Self-pay | Admitting: Obstetrics & Gynecology

## 2021-10-22 DIAGNOSIS — Z1501 Genetic susceptibility to malignant neoplasm of breast: Secondary | ICD-10-CM

## 2021-10-26 NOTE — Progress Notes (Incomplete)
[facility]  Telephone:([phone] Fax:(336) [phone]    ID: Dominique Cannon DOB: 13-Sep-1986  MR#: [id number]  IWL#:[id number]  Patient Care Team: Lesleigh Noe, MD as PCP - General (Family Medicine) Magrinat, Virgie Dad, MD as Consulting Physician (Oncology) Megan Salon, MD as Consulting Physician (Gynecology) Fair, Marin Shutter, MD (Inactive) as Consulting Physician (Family Medicine)  OTHER MD:  CHIEF COMPLAINT: BRCA 2 positivity  CURRENT TREATMENT: Intensified screening   INTERVAL HISTORY: Dominique Cannon returns today for follow-up of her BRCA positivity.   She underwent breast MRI yesterday, 10/19/2021, showing: breast composition D; no evidence of malignancy in either breast.  She had her most recent mammogram 04/21/2021.  At that time breast density was read as category C.  Of note, she also underwent colposcopy with endometrial biopsy on 10/04/2021 with Dr. Sabra Heck. Pathology from the procedure ([id number]) was benign.   REVIEW OF SYSTEMS:    COVID 19 VACCINATION STATUS: [place] x3, last 08/2021   HISTORY OF PRESENT ILLNESS: From the original intake note:  Dominique Cannon has a significant family history suggesting a hereditary breast cancer syndrome and her gynecologist [name] working with Dr. [name] referred her for genetic evaluation. 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 The other 19 genes tested were unremarkable.   PAST MEDICAL HISTORY: Past Medical History:  Diagnosis Date   BRCA2 positive    Family history of breast cancer in mother    mother age 86 died of breast cancer   Family history of ovarian cancer     PAST SURGICAL HISTORY: Past Surgical History:  Procedure Laterality Date   DILATION AND EVACUATION N/A 01/16/2017   Procedure: DILATATION AND EVACUATION;  Surgeon: Megan Salon, MD;  Location: [facility];  Service: Gynecology;   Laterality: N/A;   WISDOM TOOTH EXTRACTION      FAMILY HISTORY Family History  Problem Relation Age of Onset   Breast cancer Mother 47   Ovarian cancer Maternal Grandmother        dx in her 31s   Breast cancer Paternal Grandmother    Cancer Maternal Uncle        not sure what type   Other Other        positive genetic testing for "the gene"   Breast cancer Other        MGF's sister   Breast cancer Other        MGF's mother  The patient's mother died at the age of 75 from breast cancer. The patient's mother had one sister, in good health. The patient's mother's mother has a history of ovarian cancer. One maternal great aunt also had a history of breast cancer. A second has tested positive for a gene mutation, but we do not have those records. On the paternal side there is also a history of breast cancer, likely postmenopausal   GYNECOLOGIC HISTORY:  No LMP recorded. (Menstrual status: IUD). Menarche age 89, the patient is [place] P2. She uses anIUD for contraception.    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  Was. Her husband, Dominique Cannon, is also a Art therapist, and band leader but is currently working for [employer] from home.  Valarie gave birth to her son, Dominique Cannon, on 07/24/2018 and her second son Dominique Cannon on 02/05/2020.  They have not made any definite  decisions regarding further family growth    ADVANCED DIRECTIVES: In the absence of any documentation to the contrary, the patient's spouse is their HCPOA.    HEALTH MAINTENANCE: Social History   Tobacco Use   Smoking status: Never   Smokeless tobacco: Never  Vaping Use   Vaping Use: Never used  Substance Use Topics   Alcohol use: Not Currently    Comment: socially barely    Drug use: No     Colonoscopy: n/a (age)  PAP:  Bone density: n/a (age)  No Known Allergies  Current Outpatient Medications   Medication Sig Dispense Refill   PARAGARD INTRAUTERINE COPPER IU by Intrauterine route.     No current facility-administered medications for this visit.    OBJECTIVE: white woman who appears well  There were no vitals filed for this visit.  Wt Readings from Last 3 Encounters:  10/20/21 132 lb 12.8 oz (60.2 kg)  10/04/21 129 lb (58.5 kg)  09/06/21 131 lb 3.2 oz (59.5 kg)   There is no height or weight on file to calculate BMI.    ECOG FS:1 - Symptomatic but completely ambulatory  Sclerae unicteric, EOMs intact Wearing a mask No cervical or supraclavicular adenopathy Lungs no rales or rhonchi Heart regular rate and rhythm Abd soft, nontender, positive bowel sounds MSK no focal spinal tenderness, no upper extremity lymphedema Neuro: nonfocal, well oriented, appropriate affect Breasts: No suspicious masses palpated, no skin or nipple changes of concern, both axillae are benign   LAB RESULTS:  CMP     Component Value Date/Time   NA 136 08/04/2019 1444   K 3.8 08/04/2019 1444   CL 104 08/04/2019 1444   CO2 24 08/04/2019 1444   GLUCOSE 105 (H) 08/04/2019 1444   BUN 9 08/04/2019 1444   CREATININE 0.52 08/04/2019 1444   CALCIUM 9.4 08/04/2019 1444   PROT 7.0 08/04/2019 1444   ALBUMIN 4.1 08/04/2019 1444   AST 16 08/04/2019 1444   ALT 12 08/04/2019 1444   ALKPHOS 45 08/04/2019 1444   BILITOT 0.5 08/04/2019 1444   GFRNONAA >60 08/04/2019 1444   GFRAA >60 08/04/2019 1444    INo results found for: SPEP, UPEP  Lab Results  Component Value Date   WBC 12.1 (H) 02/06/2020   NEUTROABS 5.9 08/05/2019   HGB 9.3 (L) 02/06/2020   HCT 30.0 (L) 02/06/2020   MCV 82.9 02/06/2020   PLT 287 02/06/2020      Chemistry      Component Value Date/Time   NA 136 08/04/2019 1444   K 3.8 08/04/2019 1444   CL 104 08/04/2019 1444   CO2 24 08/04/2019 1444   BUN 9 08/04/2019 1444   CREATININE 0.52 08/04/2019 1444      Component Value Date/Time   CALCIUM 9.4 08/04/2019 1444    ALKPHOS 45 08/04/2019 1444   AST 16 08/04/2019 1444   ALT 12 08/04/2019 1444   BILITOT 0.5 08/04/2019 1444       No results found for: LABCA2  No components found for: LABCA125  No results for input(s): INR in the last 168 hours.  Urinalysis No results found for: COLORURINE, APPEARANCEUR, LABSPEC, PHURINE, GLUCOSEU, HGBUR, BILIRUBINUR, KETONESUR, PROTEINUR, UROBILINOGEN, NITRITE, LEUKOCYTESUR   STUDIES: MR BREAST BILATERAL W WO CONTRAST INC CAD  Result Date: 10/19/2021 CLINICAL DATA:  35 year old who carries the BRCA 2 gene mutation, family history of breast cancer in her mother diagnosed at age 44, family history of ovarian cancer. Supplemental high-risk screening MRI. EXAM: BILATERAL BREAST MRI  WITH AND WITHOUT CONTRAST TECHNIQUE: Multiplanar, multisequence MR images of both breasts were obtained prior to and following the intravenous administration of 6 ml of Gadavist. Three-dimensional MR images were rendered by post-processing of the original MR data on an independent workstation. The three-dimensional MR images were interpreted, and findings are reported in the following complete MRI report for this study. Three dimensional images were evaluated at the independent interpreting workstation using the DynaCAD thin client. COMPARISON:  Previous exam(s). FINDINGS: Breast composition: d. Extreme fibroglandular tissue. Background parenchymal enhancement: Moderate. Right breast: No suspicious mass or abnormal enhancement. Left breast: No suspicious mass or abnormal enhancement. Lymph nodes: No pathologic lymphadenopathy. Ancillary findings:  None. IMPRESSION: No MRI evidence of malignancy involving either breast. RECOMMENDATION: 1. Annual screening mammography which is due in June, 2023. 2. Supplemental high-risk screening MRI in 1 year. BI-RADS CATEGORY  1: Negative. Electronically Signed   By: Evangeline Dakin M.D.   On: 10/19/2021 11:48    ASSESSMENT: 35 y.o. BRCA 2 positive [place], [place]  woman with brest density category D   (1) patient has decided against bilateral mastectomies and BSO, at least until child-bearing and nursing are complete  (2) patient opted against tamoxifen for risk reduction  (3) breast cancer screening: yearly mammogram and MRI, preferably 6 months apart to start at the end of breast-feeding, April 2022  (a) biannual breast exam   (4) ovarian cancer screening: pelvic US every 6 months with CA 125 with Dr. Sabra Heck      PLAN: Jamilia has a very good understanding of the surveillance plan and she is following it without difficulty.  Specifically she sees Dr. Sabra Heck every 6 months for the ovarian screening and at some point she will undergo bilateral salpingo-oophorectomy.  She will have mammography in June and then breast MRI in December.  We discussed the fact that people without insurance and are paying a lot more not simply because they do not have insurance but also because they get billed for cost whereas insurance companies generally negotiate prices down.  She does have insurance and is planning to get insurance but just in case there is an issue with that next year I gave her the name of our navigators and she if necessary can request some assistance particularly with mammography.  Otherwise says she will see [place] again in 1 year.  She knows to call for any other issue that may develop before then.  Total encounter time 20 minutes.Sarajane Jews C. Magrinat, MD 10/26/21 11:35 PM Medical Oncology and Hematology [facility] [place], [place] [address] Tel. [phone]    Fax. [phone]   I, Wilburn Mylar, am acting as scribe for Dr. Virgie Dad. Magrinat.  I, Lurline Del MD, have reviewed the above documentation for accuracy and completeness, and I agree with the above.    *Total Encounter Time as defined by the Centers for Medicare and Medicaid Services includes, in addition to the face-to-face time of a  patient visit (documented in the note above) non-face-to-face time: obtaining and reviewing outside history, ordering and reviewing medications, tests or procedures, care coordination (communications with other health care professionals or caregivers) and documentation in the medical record.

## 2021-10-27 ENCOUNTER — Ambulatory Visit: Payer: 59 | Admitting: Hematology and Oncology

## 2022-02-15 ENCOUNTER — Encounter (HOSPITAL_BASED_OUTPATIENT_CLINIC_OR_DEPARTMENT_OTHER): Payer: Self-pay | Admitting: Obstetrics & Gynecology

## 2022-02-15 ENCOUNTER — Ambulatory Visit (INDEPENDENT_AMBULATORY_CARE_PROVIDER_SITE_OTHER): Payer: Managed Care, Other (non HMO)

## 2022-02-15 ENCOUNTER — Ambulatory Visit (HOSPITAL_BASED_OUTPATIENT_CLINIC_OR_DEPARTMENT_OTHER): Payer: Managed Care, Other (non HMO) | Admitting: Obstetrics & Gynecology

## 2022-02-15 VITALS — BP 115/81 | HR 75 | Ht 68.0 in | Wt 129.8 lb

## 2022-02-15 DIAGNOSIS — R9389 Abnormal findings on diagnostic imaging of other specified body structures: Secondary | ICD-10-CM

## 2022-02-15 DIAGNOSIS — Z1501 Genetic susceptibility to malignant neoplasm of breast: Secondary | ICD-10-CM

## 2022-02-15 DIAGNOSIS — Z1509 Genetic susceptibility to other malignant neoplasm: Secondary | ICD-10-CM | POA: Diagnosis not present

## 2022-02-15 DIAGNOSIS — Z975 Presence of (intrauterine) contraceptive device: Secondary | ICD-10-CM

## 2022-02-16 LAB — CA 125: Cancer Antigen (CA) 125: 35.2 U/mL (ref 0.0–38.1)

## 2022-02-16 NOTE — Progress Notes (Signed)
GYNECOLOGY  VISIT ? ?CC:   6 month follow up, h/o BRCA gene positve ? ?HPI: ?36 y.o. [id number] Married White or Caucasian female here for ultrasound and ca 125.  Having this every six months.  Has IUD for contraception.  Doesn't love it..  unsure about future pregnancy at this point but not ready to make any decisions. ? ?Ultrasound showed normal uterus and normal ovaries.  IUD in correct location. ? ?Patient Active Problem List  ? Diagnosis Date Noted  ? Breast cancer screening, high risk patient 03/03/2020  ? At high risk for breast cancer 03/03/2020  ? High risk of ovarian cancer 03/03/2020  ? History of shoulder dystocia in prior pregnancy, currently pregnant 12/16/2019  ? 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 BRCA2 positive 09/27/2015  ? Family history of breast cancer   ? ? ?Past Medical History:  ?Diagnosis Date  ? BRCA2 positive   ? Family history of breast cancer in mother   ? mother age 64 died of breast cancer  ? Family history of ovarian cancer   ? ? ?Past Surgical History:  ?Procedure Laterality Date  ? DILATION AND EVACUATION N/A 01/16/2017  ? Procedure: DILATATION AND EVACUATION;  Surgeon: Megan Salon, MD;  Location: [facility];  Service: Gynecology;  Laterality: N/A;  ? WISDOM TOOTH EXTRACTION    ? ? ?MEDS:   ?Current Outpatient Medications on File Prior to Visit  ?Medication Sig Dispense Refill  ? PARAGARD INTRAUTERINE COPPER IU by Intrauterine route.    ? ?No current facility-administered medications on file prior to visit.  ? ? ?ALLERGIES: Patient has no known allergies. ? ?Family History  ?Problem Relation Age of Onset  ? Breast cancer Mother 54  ? Ovarian cancer Maternal Grandmother   ?     dx in her 49s  ? Breast cancer Paternal Grandmother   ? Cancer Maternal Uncle   ?     not sure what type  ? Other Other   ?     positive genetic testing for "the gene"  ? Breast cancer Other   ?     MGF's sister  ? Breast cancer Other   ?     MGF's mother  ? ? ?SH:  married, non smoker ? ?Review of  Systems  ?All other systems reviewed and are negative. ? ?PHYSICAL EXAMINATION:   ? ?BP 115/81 (BP Location: Right Arm, Patient Position: Sitting, Cuff Size: Normal)   Pulse 75   Ht 5' 8" (1.727 m) Comment: reported  Wt 129 lb 12.8 oz (58.9 kg)   BMI 19.74 kg/m?     ?General appearance: alert, cooperative and appears stated age ? ? ?Assessment/Plan: ?1. BRCA2 gene mutation positive ?- recheck 6 months for AEX.  Will plan repeat PUS and ca-125 them. ?- CA 125 will be obtained today ? ? ? ?

## 2022-03-08 ENCOUNTER — Other Ambulatory Visit (HOSPITAL_BASED_OUTPATIENT_CLINIC_OR_DEPARTMENT_OTHER): Payer: 59

## 2022-03-08 ENCOUNTER — Ambulatory Visit (HOSPITAL_BASED_OUTPATIENT_CLINIC_OR_DEPARTMENT_OTHER): Payer: 59 | Admitting: Obstetrics & Gynecology

## 2022-04-21 ENCOUNTER — Ambulatory Visit
Admission: RE | Admit: 2022-04-21 | Discharge: 2022-04-21 | Disposition: A | Discharge status: Home / Self Care | Payer: Commercial Managed Care - HMO | Source: Ambulatory Visit | Attending: Oncology | Admitting: Oncology

## 2022-04-21 ENCOUNTER — Other Ambulatory Visit: Payer: Self-pay | Admitting: Hematology and Oncology

## 2022-04-21 DIAGNOSIS — Z9189 Other specified personal risk factors, not elsewhere classified: Secondary | ICD-10-CM

## 2022-05-10 ENCOUNTER — Ambulatory Visit: Payer: Self-pay

## 2022-09-13 ENCOUNTER — Ambulatory Visit (HOSPITAL_BASED_OUTPATIENT_CLINIC_OR_DEPARTMENT_OTHER): Payer: 59 | Admitting: Obstetrics & Gynecology

## 2022-09-27 ENCOUNTER — Ambulatory Visit (INDEPENDENT_AMBULATORY_CARE_PROVIDER_SITE_OTHER): Payer: 59

## 2022-09-27 ENCOUNTER — Encounter (HOSPITAL_BASED_OUTPATIENT_CLINIC_OR_DEPARTMENT_OTHER): Payer: Self-pay | Admitting: Obstetrics & Gynecology

## 2022-09-27 ENCOUNTER — Other Ambulatory Visit (HOSPITAL_COMMUNITY)
Admission: RE | Admit: 2022-09-27 | Discharge: 2022-09-27 | Disposition: A | Discharge status: Home / Self Care | Payer: 59 | Source: Ambulatory Visit | Attending: Obstetrics & Gynecology | Admitting: Obstetrics & Gynecology

## 2022-09-27 ENCOUNTER — Other Ambulatory Visit (HOSPITAL_BASED_OUTPATIENT_CLINIC_OR_DEPARTMENT_OTHER): Payer: Self-pay | Admitting: Obstetrics & Gynecology

## 2022-09-27 ENCOUNTER — Ambulatory Visit (INDEPENDENT_AMBULATORY_CARE_PROVIDER_SITE_OTHER): Payer: 59 | Admitting: Obstetrics & Gynecology

## 2022-09-27 VITALS — BP 116/84 | HR 74 | Ht 68.0 in | Wt 132.8 lb

## 2022-09-27 DIAGNOSIS — Z1509 Genetic susceptibility to other malignant neoplasm: Secondary | ICD-10-CM | POA: Diagnosis not present

## 2022-09-27 DIAGNOSIS — Z1501 Genetic susceptibility to malignant neoplasm of breast: Secondary | ICD-10-CM

## 2022-09-27 DIAGNOSIS — R87619 Unspecified abnormal cytological findings in specimens from cervix uteri: Secondary | ICD-10-CM

## 2022-09-27 DIAGNOSIS — Z9189 Other specified personal risk factors, not elsewhere classified: Secondary | ICD-10-CM

## 2022-09-27 DIAGNOSIS — Z1589 Genetic susceptibility to other disease: Secondary | ICD-10-CM

## 2022-09-28 LAB — CA 125: Cancer Antigen (CA) 125: 26.9 U/mL (ref 0.0–38.1)

## 2022-09-30 NOTE — Progress Notes (Signed)
GYNECOLOGY  VISIT  CC:   h/o BRCA 2 gene mutation, ultrasound, ca-125  HPI: 36 y.o. G4P2022 Married White or Caucasian female here for ultrasound and ca-125 due to hx of BRCA 2 gene mutation.   Followed by Dr. Chryl Heck now that Dr. Jana Hakim has retired.  Has breast MRI scheduled in early December.  Cycles are regular.   Has Paragard IUD.   Last year had AGUS pap smear with neg HR HPV.  Colposcopy with ECC and endometrial biopsy were normal.  Does need repeat pap smear today.    Ultrasound today showed normal uterus with IUD in correct location.  Ovaries normal.  Endometrium 7.17m.   Past Medical History:  Diagnosis Date   BRCA2 positive    Family history of breast cancer in mother    mother age 5442died of breast cancer   Family history of ovarian cancer     MEDS:   Current Outpatient Medications on File Prior to Visit  Medication Sig Dispense Refill   PARAGARD INTRAUTERINE COPPER IU by Intrauterine route.     No current facility-administered medications on file prior to visit.    ALLERGIES: Patient has no known allergies.  SH:  married, non smoker  Review of Systems  Constitutional: Negative.   Genitourinary: Negative.     PHYSICAL EXAMINATION:    BP 116/84   Pulse 74   Ht _0  (1.727 m)   Wt 132 lb 12.8 oz (60.2 kg)   LMP 09/08/2022 (Approximate)   BMI 20.19 kg/m     General appearance: alert, cooperative and appears stated age Lymph:  no inguinal LAD noted  Pelvic: External genitalia:  no lesions              Urethra:  normal appearing urethra with no masses, tenderness or lesions              Bartholins and Skenes: normal                 Vagina: normal appearing vagina with normal color and discharge, no lesions              Cervix: no lesions                Chaperone, KEzekiel Ina CMA, was present for exam.  Assessment/Plan: 1. BRCA2 gene mutation positive - ultrasound normal today - CA 125 obtained  2. Atypical glandular cells of undetermined  significance (AGUS) on cervical Pap smear - Cytology - PAP( [facility])

## 2022-10-02 LAB — CYTOLOGY - PAP
Comment: NEGATIVE
Diagnosis: NEGATIVE
Diagnosis: REACTIVE
High risk HPV: NEGATIVE

## 2022-10-20 ENCOUNTER — Ambulatory Visit
Admission: RE | Admit: 2022-10-20 | Discharge: 2022-10-20 | Disposition: A | Discharge status: Home / Self Care | Payer: 59 | Source: Ambulatory Visit | Attending: Hematology and Oncology | Admitting: Hematology and Oncology

## 2022-10-20 ENCOUNTER — Telehealth: Payer: Self-pay | Admitting: *Deleted

## 2022-10-20 DIAGNOSIS — Z9189 Other specified personal risk factors, not elsewhere classified: Secondary | ICD-10-CM

## 2022-10-20 MED ORDER — GADOPICLENOL 0.5 MMOL/ML IV SOLN
6.0000 mL | Freq: Once | INTRAVENOUS | Status: AC | PRN
  Administered 2022-10-20: 6 mL via INTRAVENOUS

## 2022-10-20 NOTE — Telephone Encounter (Signed)
Called and left message on pt personal cell below. Advised to call office for any future concerns.

## 2022-10-20 NOTE — Telephone Encounter (Signed)
-----   Message from Loa Socks, NP sent at 10/20/2022  1:36 PM EST ----- MRI neg for malignancy, please let patient know  ----- Message ----- From: Interface, Rad Results In Sent: 10/20/2022   1:07 PM EST To: Rachel Moulds, MD

## 2022-10-22 IMAGING — MG MM DIGITAL SCREENING BILAT W/ TOMO AND CAD
6 of 10 series · 6 of 30 positions shown · non-contrast
Comparison: Previous exam(s).

CLINICAL DATA: High risk screening. Patient is QYCMO positive and
has a strong family history of breast cancer, including her mother
who was diagnosed at age 30.

EXAM:
DIGITAL SCREENING BILATERAL MAMMOGRAM WITH TOMOSYNTHESIS AND CAD
TECHNIQUE: Bilateral screening digital craniocaudal and mediolateral oblique
mammograms were obtained. Bilateral screening digital breast
tomosynthesis was performed. The images were evaluated with
computer-aided detection.

[L MLO synth-2D (1 of 2)]
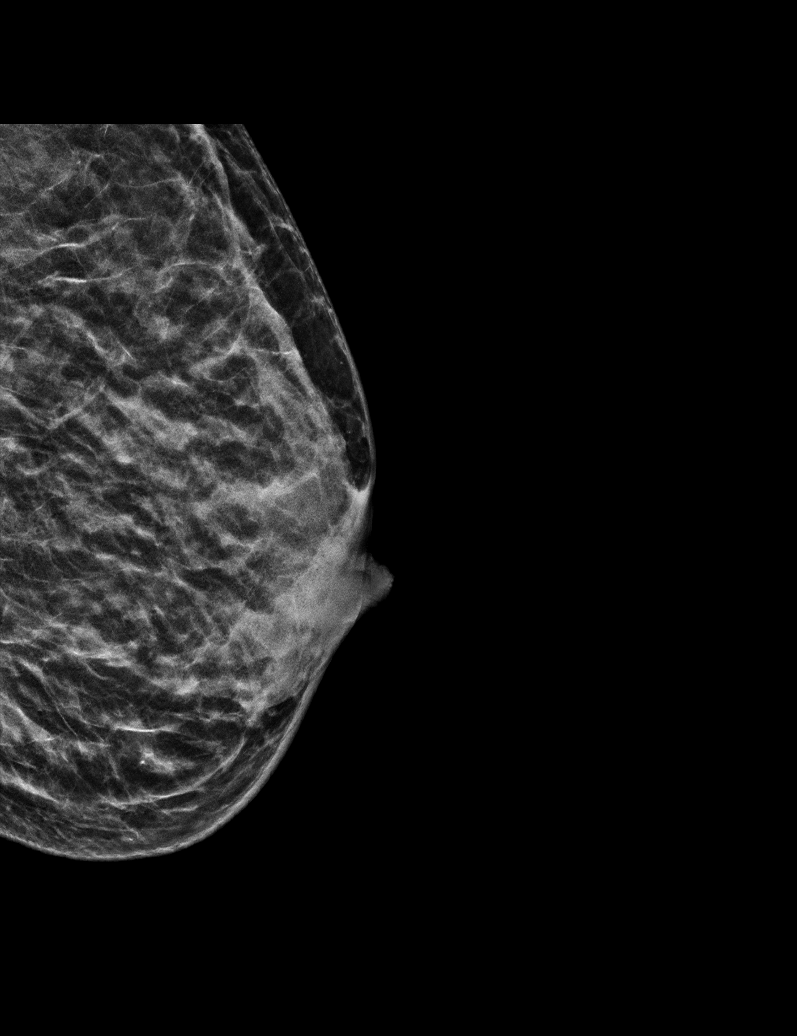

[R MLO synth-2D]
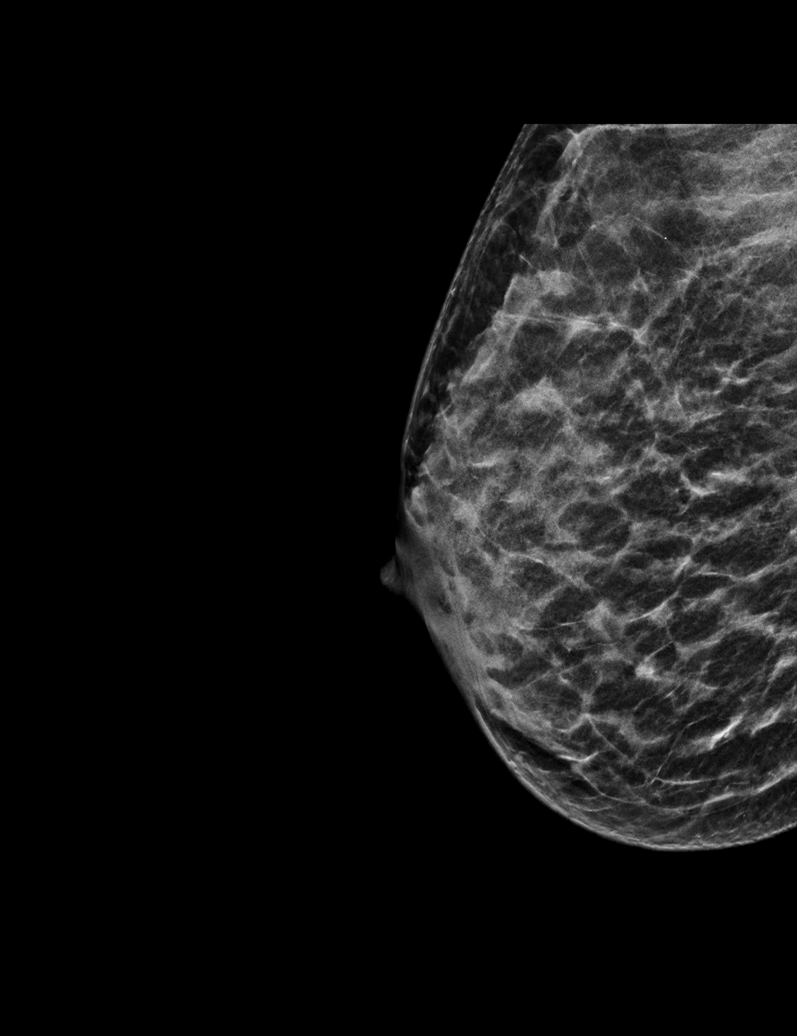

[L MLO synth-2D (2 of 2)]
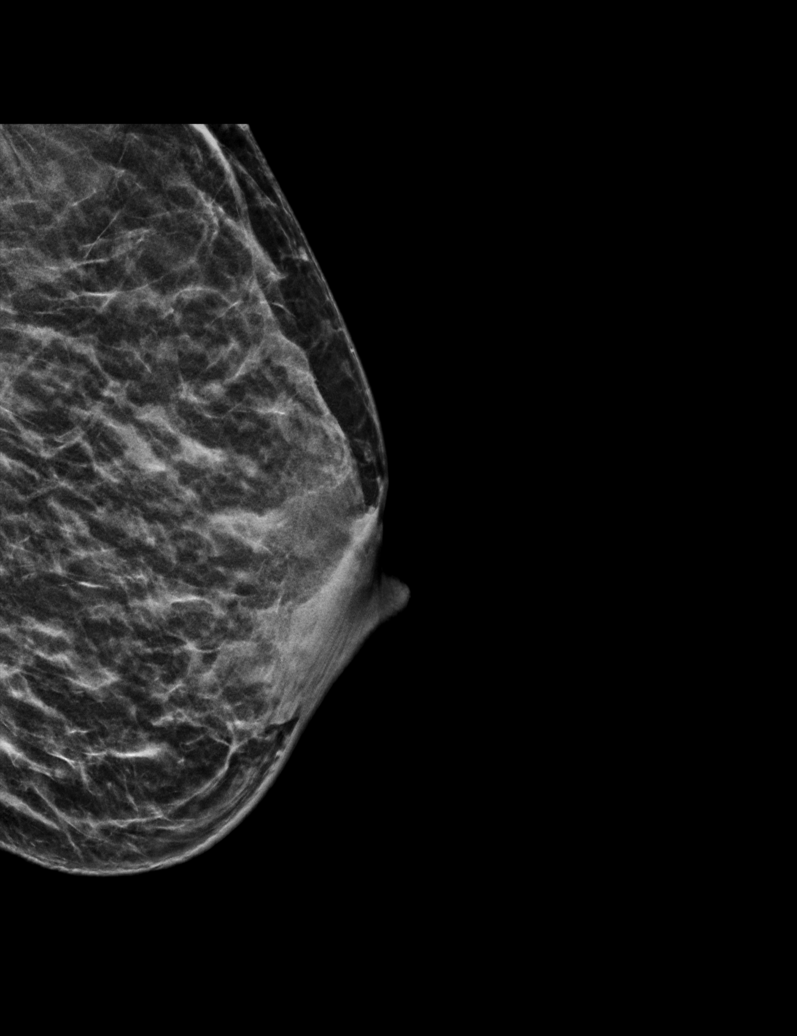

[L CC synth-2D]
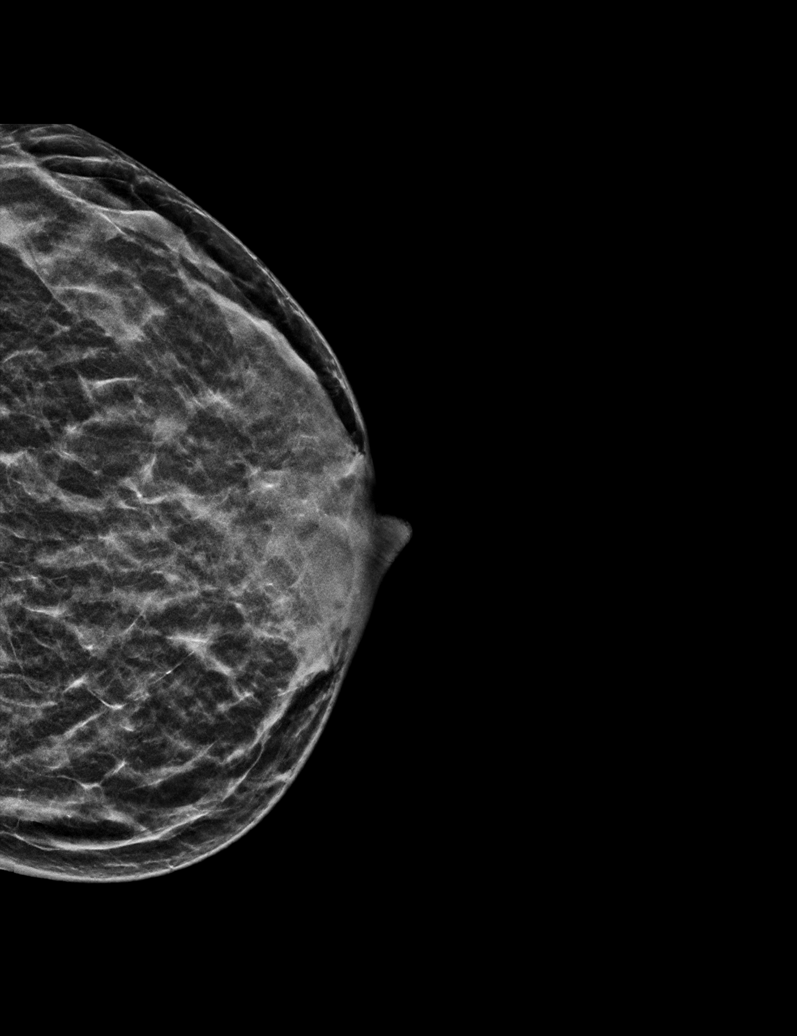

[R CC synth-2D]
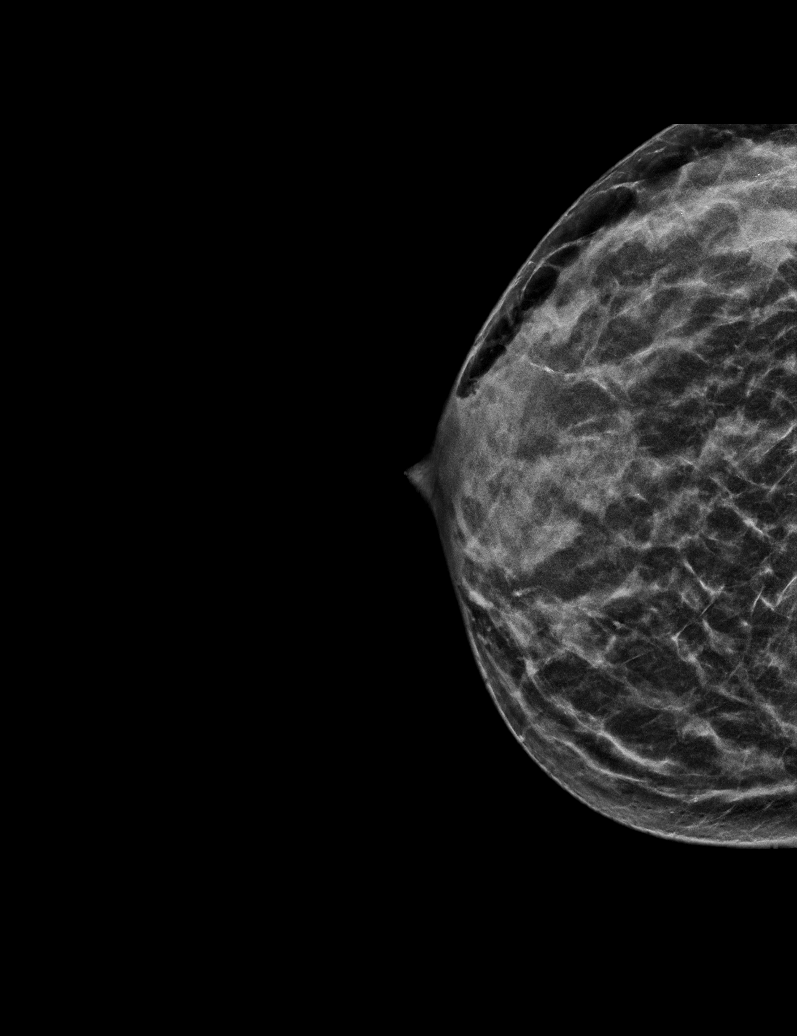

[R MLO tomo · tomo slice 19/36.0]
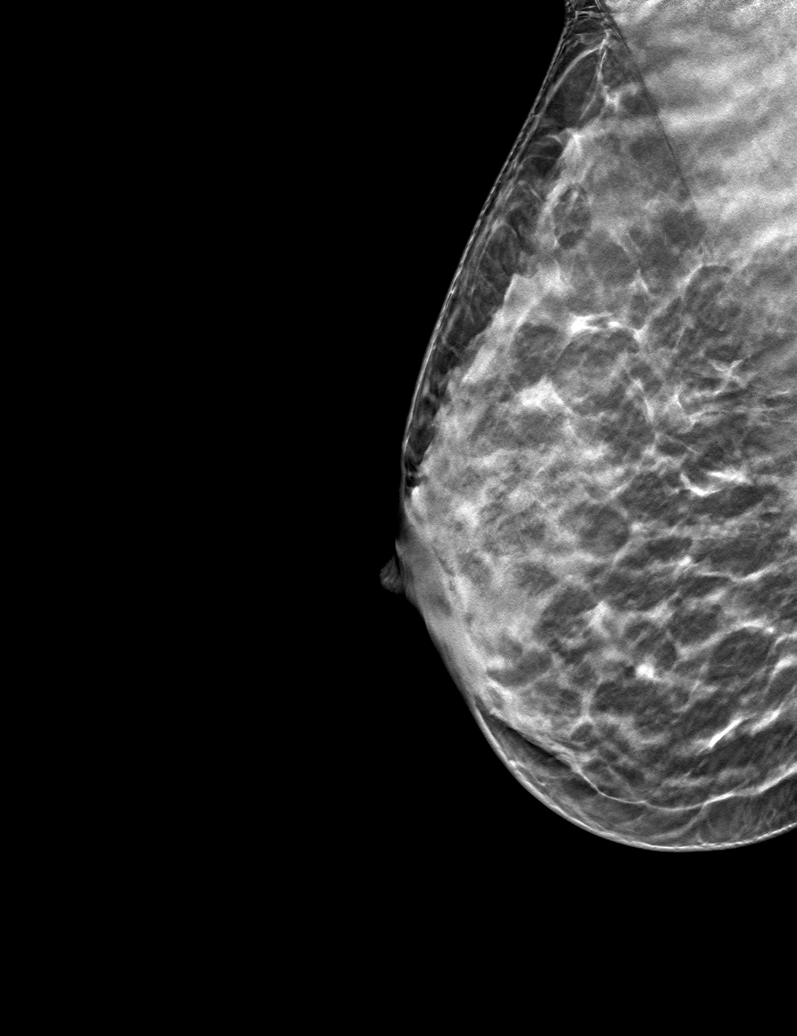

[6 of 30 positions shown; findings below may reference images not displayed]

ACR Breast Density Category c: The breast tissue is heterogeneously
dense, which may obscure small masses.
FINDINGS: There are no findings suspicious for malignancy. The images were
evaluated with computer-aided detection.
IMPRESSION: No mammographic evidence of malignancy. A result letter of this
screening mammogram will be mailed directly to the patient.

RECOMMENDATION:
1.  Screening mammogram in one year.(Code:[id number])
2. Given the patient's QYCMO gene positivity, supplemental high risk
screening MRI is also recommended.

BI-RADS CATEGORY  1: Negative.

## 2022-10-24 ENCOUNTER — Inpatient Hospital Stay: Payer: 59 | Attending: Hematology and Oncology | Admitting: Hematology and Oncology

## 2022-10-24 VITALS — BP 124/77 | HR 78 | Temp 97.8°F | Resp 16 | Ht 68.0 in | Wt 133.4 lb

## 2022-10-24 DIAGNOSIS — Z1501 Genetic susceptibility to malignant neoplasm of breast: Secondary | ICD-10-CM | POA: Insufficient documentation

## 2022-10-24 DIAGNOSIS — Z803 Family history of malignant neoplasm of breast: Secondary | ICD-10-CM | POA: Diagnosis not present

## 2022-10-24 DIAGNOSIS — Z1509 Genetic susceptibility to other malignant neoplasm: Secondary | ICD-10-CM | POA: Diagnosis not present

## 2022-10-24 DIAGNOSIS — Z9189 Other specified personal risk factors, not elsewhere classified: Secondary | ICD-10-CM

## 2022-10-24 DIAGNOSIS — Z1502 Genetic susceptibility to malignant neoplasm of ovary: Secondary | ICD-10-CM | POA: Diagnosis not present

## 2022-10-24 DIAGNOSIS — Z8041 Family history of malignant neoplasm of ovary: Secondary | ICD-10-CM | POA: Insufficient documentation

## 2022-10-24 NOTE — Progress Notes (Unsigned)
[facility]  [phone] [phone]) [phone]    ID: Dominique Cannon DOB: April 27, 1986  MR#: [id number]  ITG#:[id number]  Patient Care Team: Waunita Schooner, MD as PCP - General (Family Medicine) Magrinat, Virgie Dad, MD (Inactive) as Consulting Physician (Oncology) Megan Salon, MD as Consulting Physician (Gynecology) Fair, Marin Shutter, MD (Inactive) as Consulting Physician (Family Medicine)  OTHER MD:  CHIEF COMPLAINT: BRCA 2 positivity  CURRENT TREATMENT: Intensified screening  INTERVAL HISTORY:  Dominique Cannon returns today for follow-up of her BRCA positivity.  She underwent MRI breast on October 20, 2022 with no evidence of malignancy in either breast.  The asymmetry of the left breast is likely artifactual.  Last transvaginal ultrasound from November 2023 with no concerns.  Last CA125 was also normal.  She is never entirely sure about considering surgery for both breasts as well as BSO.  She is done with childbearing but would like to consider this a little later.  Rest of the pertinent 10 point ROS reviewed and negative  REVIEW OF SYSTEMS:  Dominique Cannon is now working from home as is her husband.  They are mutually taking care of the children were 4 and 2.    COVID 19 VACCINATION STATUS: Dominique Cannon x3, last 08/2021   HISTORY OF PRESENT ILLNESS: From the original intake note:  Shaylen has a significant family history suggesting a hereditary breast cancer syndrome and her gynecologist [name] working with Dr. [name] referred her for genetic evaluation. 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 The other 19 genes tested were unremarkable.  Her subsequent history is as detailed below   PAST MEDICAL HISTORY: Past Medical History:  Diagnosis Date   BRCA2 positive    Family history of breast cancer in mother    mother age 39 died of breast cancer   Family history of  ovarian cancer     PAST SURGICAL HISTORY: Past Surgical History:  Procedure Laterality Date   DILATION AND EVACUATION N/A 01/16/2017   Procedure: DILATATION AND EVACUATION;  Surgeon: Megan Salon, MD;  Location: [facility];  Service: Gynecology;  Laterality: N/A;   WISDOM TOOTH EXTRACTION      FAMILY HISTORY Family History  Problem Relation Age of Onset   Breast cancer Mother 38   Ovarian cancer Maternal Grandmother        dx in her 31s   Breast cancer Paternal Grandmother    Cancer Maternal Uncle        not sure what type   Other Other        positive genetic testing for "the gene"   Breast cancer Other        MGF's sister   Breast cancer Other        MGF's mother  The patient's mother died at the age of 18 from breast cancer. The patient's mother had one sister, in good health. The patient's mother's mother has a history of ovarian cancer. One maternal great aunt also had a history of breast cancer. A second has tested positive for a gene mutation, but we do not have those records. On the paternal side there is also a history of breast cancer, likely postmenopausal   GYNECOLOGIC HISTORY:  Patient's last menstrual period was 09/08/2022 (approximate). Menarche age 69, the patient is [place] P2. She uses anIUD for contraception.    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  Was. Her husband, Dominique Cannon, is also a Art therapist, and band leader but is currently working for [employer] from home.  Dominique Cannon gave birth to her son, Dominique Cannon, on 07/24/2018 and her second son Dominique Cannon on 02/05/2020.   ADVANCED DIRECTIVES: In the absence of any documentation to the contrary, the patient's spouse is their HCPOA.    HEALTH MAINTENANCE: Social History   Tobacco Use   Smoking status: Never   Smokeless tobacco: Never  Vaping Use   Vaping Use: Never used  Substance Use Topics   Alcohol  use: Not Currently    Comment: socially barely    Drug use: No     Colonoscopy: n/a (age)  PAP:  Bone density: n/a (age)  No Known Allergies  Current Outpatient Medications  Medication Sig Dispense Refill   PARAGARD INTRAUTERINE COPPER IU by Intrauterine route.     No current facility-administered medications for this visit.    OBJECTIVE: white woman who appears well  Vitals:   10/24/22 1404  BP: 124/77  Pulse: 78  Resp: 16  Temp: 97.8 F (36.6 C)  SpO2: 100%   Wt Readings from Last 3 Encounters:  10/24/22 133 lb 6.4 oz (60.5 kg)  09/27/22 132 lb 12.8 oz (60.2 kg)  02/15/22 129 lb 12.8 oz (58.9 kg)   Body mass index is 20.28 kg/m.    ECOG FS:1 - Symptomatic but completely ambulatory  Sclerae unicteric, EOMs intact Bilateral breast exam done.  Scattered density noted.  No obvious palpable masses or regional adenopathy. No lower extremity edema.   LAB RESULTS:  CMP     Component Value Date/Time   NA 136 08/04/2019 1444   K 3.8 08/04/2019 1444   CL 104 08/04/2019 1444   CO2 24 08/04/2019 1444   GLUCOSE 105 (H) 08/04/2019 1444   BUN 9 08/04/2019 1444   CREATININE 0.52 08/04/2019 1444   CALCIUM 9.4 08/04/2019 1444   PROT 7.0 08/04/2019 1444   ALBUMIN 4.1 08/04/2019 1444   AST 16 08/04/2019 1444   ALT 12 08/04/2019 1444   ALKPHOS 45 08/04/2019 1444   BILITOT 0.5 08/04/2019 1444   GFRNONAA >60 08/04/2019 1444   GFRAA >60 08/04/2019 1444    INo results found for: "SPEP", "UPEP"  Lab Results  Component Value Date   WBC 12.1 (H) 02/06/2020   NEUTROABS 5.9 08/05/2019   HGB 9.3 (L) 02/06/2020   HCT 30.0 (L) 02/06/2020   MCV 82.9 02/06/2020   PLT 287 02/06/2020      Chemistry      Component Value Date/Time   NA 136 08/04/2019 1444   K 3.8 08/04/2019 1444   CL 104 08/04/2019 1444   CO2 24 08/04/2019 1444   BUN 9 08/04/2019 1444   CREATININE 0.52 08/04/2019 1444      Component Value Date/Time   CALCIUM 9.4 08/04/2019 1444   ALKPHOS 45  08/04/2019 1444   AST 16 08/04/2019 1444   ALT 12 08/04/2019 1444   BILITOT 0.5 08/04/2019 1444       No results found for: "LABCA2"  No components found for: "LABCA125"  No results for input(s): "INR" in the last 168 hours.  Urinalysis No results found for: "COLORURINE", "APPEARANCEUR", "LABSPEC", "PHURINE", "GLUCOSEU", "HGBUR", "BILIRUBINUR", "KETONESUR", "PROTEINUR", "UROBILINOGEN", "NITRITE", "LEUKOCYTESUR"   STUDIES: MR BREAST BILATERAL [place] CAD  Result Date: 10/20/2022 CLINICAL DATA:  36 year old high risk female due to BRCA 2 gene mutation presenting  for screening MRI. She has family history of breast cancer as well in her mother in her early 49s as well as her maternal and paternal grandmother. The patient is asymptomatic. EXAM: BILATERAL BREAST MRI WITH AND WITHOUT CONTRAST TECHNIQUE: Multiplanar, multisequence MR images of both breasts were obtained prior to and following the intravenous administration of 6 ml of Vueway Three-dimensional MR images were rendered by post-processing of the original MR data on an independent workstation. The three-dimensional MR images were interpreted, and findings are reported in the following complete MRI report for this study. Three dimensional images were evaluated at the independent interpreting workstation using the DynaCAD thin client. COMPARISON:  Previous exam(s). FINDINGS: Breast composition: c. Heterogeneous fibroglandular tissue. Background parenchymal enhancement: Mild Right breast: No mass or abnormal enhancement. Left breast: No mass or abnormal enhancement. The contour of the left breast appears asymmetric/flattened compared to the right, with an the elongated horizontal linear impression along the breast anteriorly. In discussing this appearance with the technologist, she indicates that a sheet was used as a sling within the breast coil, and may have been pulled tightly on the left side which can cause this appearance. Lymph  nodes: No abnormal appearing lymph nodes. Ancillary findings:  None. IMPRESSION: 1. No MRI evidence of malignancy in either breast. The asymmetric contour of the left breast is likely artifactual due to the impression from the sheet sling underneath the breast used during imaging. RECOMMENDATION: 1. Physical exam correlation is recommended to confirm that the bilateral breast contours are symmetric/stable to confirm that the imaging asymmetry is artifactual. 2.  Annual screening mammography will be due in June of 2024. 3.  High risk screening MRI in 1 year. BI-RADS CATEGORY  1: Negative. Electronically Signed   By: Ammie Ferrier M.D.   On: 10/20/2022 13:04  US PELVIS TRANSVAGINAL NON-OB (TV ONLY)  Addendum Date: 10/04/2022   Addendum:  pt has hx of BRCA 2 gene mutation, no Lynch syndrome, with increased risk for ovarian cancer  Result Date: 10/04/2022 CLINICAL DATA:  h/o Lynch syndrome, increased risk for endometrial cancer  EXAM: TRANSVAGINAL ULTRASOUND OF PELVIS  TECHNIQUE: Transvaginal ultrasound examination of the pelvis was performed for imaging of the pelvis including uterus, ovaries, adnexal regions, and pelvic cul-de-sac.  FINDINGS: Uterus: 8.9 x 4.8 x 5.8cm with IUD in correct location.  Normal shape/size.  Volume: 177m  Endometrial thickness:  7.568m Right ovary:  2.7 x 1.6 x 3.4cm with 2 x 1cm resolving corpus luteum.  Volume:  1062mLeft ovary:  3.5 x 1.3 x 2.3cm.  Volume:  5.8ml53mther findings:  No abnormal free fluid.    ASSESSMENT: 36 y46. BRCA 2 positive [place] woman with brest density category D   (1) patient has decided against bilateral mastectomies and BSO, at least until child-bearing and nursing are complete  (2) patient opted against tamoxifen for risk reduction  (3) breast cancer screening: yearly mammogram and MRI, preferably 6 months apart to start at the end of breast-feeding, April 2022  (a) biannual breas   t exam   (4) ovarian cancer screening: pelvic  US [place] 6 months with CA 125 with Dr. MillSabra Heck  PLAN:  MereLonnie a very good understanding of the surveillance plan and she is following it without difficulty.  Specifically she sees Dr. MillSabra Heckry 6 months for the ovarian screening and at some point she will undergo bilateral salpingo-oophorectomy. She continues with mammograms alternating with breast MRI.  Most recent  breast MRI, ordered screening mammogram for summer.  She will continue to follow-up with Dr. Sabra Heck twice a year. We have once again discussed about BRCA2 as well as risk of breast cancer, ovarian and fallopian tube cancers.  Since she is very young, she would like to postpone BSO until a bit later.  She would like to do transfer ultrasounds and CA125 every 6 months.  She understands that ovarian cancer is generally hard to detect movement of the ultrasounds and kidney markers. I encouraged her to return to clinic with [place] in 1 year.  Total time spent: 30 minutes  *Total Encounter Time as defined by the Centers for Medicare and Medicaid Services includes, in addition to the face-to-face time of a patient visit (documented in the note above) non-face-to-face time: obtaining and reviewing outside history, ordering and reviewing medications, tests or procedures, care coordination (communications with other health care professionals or caregivers) and documentation in the medical record.

## 2022-10-25 ENCOUNTER — Encounter: Payer: Self-pay | Admitting: Hematology and Oncology

## 2023-04-23 ENCOUNTER — Ambulatory Visit
Admission: RE | Admit: 2023-04-23 | Discharge: 2023-04-23 | Disposition: A | Discharge status: Home / Self Care | Payer: 59 | Source: Ambulatory Visit | Attending: Hematology and Oncology | Admitting: Hematology and Oncology

## 2023-04-23 DIAGNOSIS — Z1501 Genetic susceptibility to malignant neoplasm of breast: Secondary | ICD-10-CM

## 2023-04-23 DIAGNOSIS — Z9189 Other specified personal risk factors, not elsewhere classified: Secondary | ICD-10-CM

## 2023-05-23 ENCOUNTER — Other Ambulatory Visit (HOSPITAL_BASED_OUTPATIENT_CLINIC_OR_DEPARTMENT_OTHER): Payer: Self-pay | Admitting: Obstetrics & Gynecology

## 2023-05-23 ENCOUNTER — Ambulatory Visit (INDEPENDENT_AMBULATORY_CARE_PROVIDER_SITE_OTHER): Payer: 59 | Admitting: Obstetrics & Gynecology

## 2023-05-23 ENCOUNTER — Encounter (HOSPITAL_BASED_OUTPATIENT_CLINIC_OR_DEPARTMENT_OTHER): Payer: Self-pay | Admitting: Obstetrics & Gynecology

## 2023-05-23 ENCOUNTER — Ambulatory Visit (INDEPENDENT_AMBULATORY_CARE_PROVIDER_SITE_OTHER): Payer: 59

## 2023-05-23 VITALS — BP 133/82 | HR 83 | Ht 68.0 in | Wt 134.2 lb

## 2023-05-23 DIAGNOSIS — Z1509 Genetic susceptibility to other malignant neoplasm: Secondary | ICD-10-CM

## 2023-05-23 DIAGNOSIS — Z1501 Genetic susceptibility to malignant neoplasm of breast: Secondary | ICD-10-CM | POA: Diagnosis not present

## 2023-05-23 DIAGNOSIS — Z9189 Other specified personal risk factors, not elsewhere classified: Secondary | ICD-10-CM

## 2023-05-23 DIAGNOSIS — Z15060 Genetic susceptibility to colorectal cancer: Secondary | ICD-10-CM

## 2023-05-24 LAB — CA 125: Cancer Antigen (CA) 125: 23 U/mL (ref 0.0–38.1)

## 2023-05-30 NOTE — Progress Notes (Signed)
GYNECOLOGY  VISIT  CC:   review u/s, discuss surgical planning  HPI: 37 y.o. [phone] Married White or Caucasian female here for ultrasound due to h/o BRCA 2 gene mutation.  Ultrasound shows normal uterus 10 x 6 x 5cm with volum 169.  Endometrium 1.4cm with trilaminar appearance and IUD in correct location.  Right ovary normal.  Left ovayr 4 x 4 x 2.4cm with 2.8cm cyst that is simple and avascular.  Ca-125 will be obtained today.  Pt has questions about surgery recommended for risk reduction of ovarian cancer.  Laparoscopic BSO vs BSO with TLH discussed.  Reasons for BSO vs TLH with BSO discussed as well as reasons patients choose one or the other.  Timing discussed.  Surgical menopause and treatment options discussed as well.  Pt is not ready to proceed now but starting to gather information/ask questions.  Past Medical History:  Diagnosis Date   BRCA2 positive    Family history of breast cancer in mother    mother age 37 died of breast cancer   Family history of ovarian cancer     MEDS:   Current Outpatient Medications on File Prior to Visit  Medication Sig Dispense Refill   PARAGARD INTRAUTERINE COPPER IU by Intrauterine route.     No current facility-administered medications on file prior to visit.    ALLERGIES: Patient has no known allergies.  SH:  married, non smoker  Review of Systems  Constitutional: Negative.   Genitourinary: Negative.     PHYSICAL EXAMINATION:    BP 133/82   Pulse 83   Ht 5\' 8"  (1.727 m)   Wt 134 lb 3.2 oz (60.9 kg)   BMI 20.41 kg/m     Physical Exam Constitutional:      Appearance: Normal appearance.  Neurological:     General: No focal deficit present.  Psychiatric:        Mood and Affect: Mood normal.        Behavior: Behavior normal.     Assessment/Plan: 1. High risk of ovarian cancer - repeat u/s 6 months with AEX  2. BRCA2 gene mutation positive - CA 125  Total time with pt: 23 minutes

## 2023-09-12 ENCOUNTER — Ambulatory Visit (HOSPITAL_BASED_OUTPATIENT_CLINIC_OR_DEPARTMENT_OTHER): Payer: 59 | Admitting: Obstetrics & Gynecology

## 2023-10-03 ENCOUNTER — Telehealth: Payer: Self-pay | Admitting: Hematology and Oncology

## 2023-10-03 ENCOUNTER — Other Ambulatory Visit: Payer: Self-pay | Admitting: *Deleted

## 2023-10-03 DIAGNOSIS — Z1501 Genetic susceptibility to malignant neoplasm of breast: Secondary | ICD-10-CM

## 2023-10-03 DIAGNOSIS — Z9189 Other specified personal risk factors, not elsewhere classified: Secondary | ICD-10-CM

## 2023-10-03 NOTE — Telephone Encounter (Signed)
Called and left message of patient yearly appointment.

## 2023-10-18 ENCOUNTER — Ambulatory Visit (HOSPITAL_BASED_OUTPATIENT_CLINIC_OR_DEPARTMENT_OTHER): Payer: 59 | Admitting: Obstetrics & Gynecology

## 2023-10-31 ENCOUNTER — Ambulatory Visit (INDEPENDENT_AMBULATORY_CARE_PROVIDER_SITE_OTHER): Payer: 59

## 2023-10-31 ENCOUNTER — Encounter (HOSPITAL_BASED_OUTPATIENT_CLINIC_OR_DEPARTMENT_OTHER): Payer: Self-pay | Admitting: Obstetrics & Gynecology

## 2023-10-31 ENCOUNTER — Ambulatory Visit (HOSPITAL_BASED_OUTPATIENT_CLINIC_OR_DEPARTMENT_OTHER): Payer: 59 | Admitting: Obstetrics & Gynecology

## 2023-10-31 ENCOUNTER — Other Ambulatory Visit (HOSPITAL_COMMUNITY)
Admission: RE | Admit: 2023-10-31 | Discharge: 2023-10-31 | Disposition: A | Discharge status: Home / Self Care | Payer: 59 | Source: Ambulatory Visit | Attending: Obstetrics & Gynecology | Admitting: Obstetrics & Gynecology

## 2023-10-31 VITALS — BP 131/73 | HR 73 | Wt 130.0 lb

## 2023-10-31 DIAGNOSIS — Z1501 Genetic susceptibility to malignant neoplasm of breast: Secondary | ICD-10-CM | POA: Diagnosis not present

## 2023-10-31 DIAGNOSIS — Z01419 Encounter for gynecological examination (general) (routine) without abnormal findings: Secondary | ICD-10-CM

## 2023-10-31 DIAGNOSIS — Z1509 Genetic susceptibility to other malignant neoplasm: Secondary | ICD-10-CM | POA: Diagnosis not present

## 2023-10-31 DIAGNOSIS — Z9189 Other specified personal risk factors, not elsewhere classified: Secondary | ICD-10-CM | POA: Diagnosis not present

## 2023-10-31 DIAGNOSIS — Z975 Presence of (intrauterine) contraceptive device: Secondary | ICD-10-CM

## 2023-10-31 DIAGNOSIS — D229 Melanocytic nevi, unspecified: Secondary | ICD-10-CM

## 2023-10-31 DIAGNOSIS — R87619 Unspecified abnormal cytological findings in specimens from cervix uteri: Secondary | ICD-10-CM | POA: Diagnosis present

## 2023-10-31 NOTE — Progress Notes (Signed)
37 y.o. [id number] Married White or Caucasian female here for annual exam and ultrasound.  H/o BRCA2.  Ultrasound today was normal with IUD in correct location.  Ovaries normal with  dominant follicle on left ovary.  Ca-125 will be obtained today as well.  Discussed with pt risk reducing surgery with BSO around age 89.  Although she thinks they are comfortable with their family size, she is not completely sure and not ready for this right now.  Also, has done some thinking about prophylactic breast surgery.  Has not seen a Careers adviser.  Feel this should be done.  Referral will be placed.  Also, discussed tamoxifen for risk reduction if decides not to do breast surgery.  Could refer to high risk breast clinic.  She will let me know if needs this.  Does have multiple moles and needs skin survey.  Referral will be placed as well.   Has paragard IUD placed in 2021.  Cycles are regular but heavier than they used to be prior to the IUD.  No LMP recorded. (Menstrual status: IUD).          Sexually active: Yes.    The current method of family planning is IUD.     Smoker:  no  Health Maintenance: Pap:  09/27/2022 Negative but had AGUS pap in 08/2021 History of abnormal Pap:  no MMG:  04/23/2023 Negative, alternating with breast MRI Colonoscopy:  not indicated at this time Screening Labs: done with PCP   reports that she has never smoked. She has never used smokeless tobacco. She reports that she does not currently use alcohol. She reports that she does not use drugs.  Past Medical History:  Diagnosis Date   BRCA2 positive    Family history of breast cancer in mother    mother age 26 died of breast cancer   Family history of ovarian cancer     Past Surgical History:  Procedure Laterality Date   DILATION AND EVACUATION N/A 01/16/2017   Procedure: DILATATION AND EVACUATION;  Surgeon: Jerene Bears, MD;  Location: [facility];  Service: Gynecology;  Laterality: N/A;   WISDOM TOOTH EXTRACTION      Current  Outpatient Medications  Medication Sig Dispense Refill   PARAGARD INTRAUTERINE COPPER IU by Intrauterine route.     No current facility-administered medications for this visit.    Family History  Problem Relation Age of Onset   Breast cancer Mother 71   Ovarian cancer Maternal Grandmother        dx in her 54s   Breast cancer Paternal Grandmother    Cancer Maternal Uncle        not sure what type   Other Other        positive genetic testing for "the gene"   Breast cancer Other        MGF's sister   Breast cancer Other        MGF's mother    ROS: Constitutional: negative Genitourinary:negative  Exam:   BP 131/73 (BP Location: Right Arm, Patient Position: Sitting, Cuff Size: Normal)   Pulse 73   Wt 130 lb (59 kg)   BMI 19.77 kg/m      General appearance: alert, cooperative and appears stated age Head: Normocephalic, without obvious abnormality, atraumatic Neck: no adenopathy, supple, symmetrical, trachea midline and thyroid normal to inspection and palpation Lungs: clear to auscultation bilaterally Breasts: normal appearance, no masses or tenderness Heart: regular rate and rhythm Abdomen: soft, non-tender; bowel sounds normal; no masses,  no organomegaly Extremities: extremities normal, atraumatic, no cyanosis or edema Skin: Skin color, texture, turgor normal. No rashes or lesions Lymph nodes: Cervical, supraclavicular, and axillary nodes normal. No abnormal inguinal nodes palpated Neurologic: Grossly normal   Pelvic: External genitalia:  no lesions              Urethra:  normal appearing urethra with no masses, tenderness or lesions              Bartholins and Skenes: normal                 Vagina: normal appearing vagina with normal color and no discharge, no lesions              Cervix: no lesions              Pap taken: No. Bimanual Exam:  Uterus:  normal size, contour, position, consistency, mobility, non-tender              Adnexa: normal adnexa and no mass,  fullness, tenderness               Rectovaginal: Confirms               Anus:  normal sphincter tone, no lesions  Chaperone, Ina Homes, CMA, was present for exam.  Assessment/Plan: 1. Well woman exam with routine gynecological exam (Primary) - Pap smear obtained with HR HPV today - Mammogram 04/2023 - Colonoscopy not indicated at this tim - lab work done with PCP - vaccines reviewed/updated  2. BRCA2 gene mutation positive - CA 125 - Ambulatory referral to Plastic Surgery - Ambulatory referral to Dermatology  3. High risk of ovarian cancer - CA 125  4. Atypical glandular cells of undetermined significance (AGUS) on cervical Pap smear - Cytology - PAP( [place])  5. Numerous moles - Ambulatory referral to Dermatology  6. IUD (intrauterine device) in place - Paragard placed 03/10/2020

## 2023-11-01 LAB — CA 125: Cancer Antigen (CA) 125: 24.4 U/mL (ref 0.0–38.1)

## 2023-11-02 LAB — CYTOLOGY - PAP
Comment: NEGATIVE
Diagnosis: NEGATIVE
High risk HPV: NEGATIVE

## 2023-11-03 DIAGNOSIS — Z975 Presence of (intrauterine) contraceptive device: Secondary | ICD-10-CM | POA: Insufficient documentation

## 2023-11-03 DIAGNOSIS — R87619 Unspecified abnormal cytological findings in specimens from cervix uteri: Secondary | ICD-10-CM | POA: Insufficient documentation

## 2023-11-16 ENCOUNTER — Ambulatory Visit (HOSPITAL_COMMUNITY)
Admission: RE | Admit: 2023-11-16 | Discharge: 2023-11-16 | Disposition: A | Discharge status: Home / Self Care | Payer: 59 | Source: Ambulatory Visit | Attending: Hematology and Oncology | Admitting: Hematology and Oncology

## 2023-11-16 DIAGNOSIS — Z1509 Genetic susceptibility to other malignant neoplasm: Secondary | ICD-10-CM | POA: Diagnosis present

## 2023-11-16 DIAGNOSIS — Z9189 Other specified personal risk factors, not elsewhere classified: Secondary | ICD-10-CM | POA: Diagnosis present

## 2023-11-16 DIAGNOSIS — Z1501 Genetic susceptibility to malignant neoplasm of breast: Secondary | ICD-10-CM | POA: Insufficient documentation

## 2023-11-16 MED ORDER — GADOBUTROL 1 MMOL/ML IV SOLN
6.0000 mL | Freq: Once | INTRAVENOUS | Status: AC | PRN
  Administered 2023-11-16: 6 mL via INTRAVENOUS

## 2023-11-27 ENCOUNTER — Inpatient Hospital Stay: Payer: 59 | Attending: Hematology and Oncology | Admitting: Hematology and Oncology

## 2023-11-27 VITALS — BP 122/72 | HR 74 | Temp 98.2°F | Resp 16 | Wt 132.5 lb

## 2023-11-27 DIAGNOSIS — L989 Disorder of the skin and subcutaneous tissue, unspecified: Secondary | ICD-10-CM | POA: Diagnosis not present

## 2023-11-27 DIAGNOSIS — Z1509 Genetic susceptibility to other malignant neoplasm: Secondary | ICD-10-CM | POA: Insufficient documentation

## 2023-11-27 DIAGNOSIS — Z1501 Genetic susceptibility to malignant neoplasm of breast: Secondary | ICD-10-CM

## 2023-11-27 DIAGNOSIS — Z9189 Other specified personal risk factors, not elsewhere classified: Secondary | ICD-10-CM

## 2023-11-27 DIAGNOSIS — Z1502 Genetic susceptibility to malignant neoplasm of ovary: Secondary | ICD-10-CM | POA: Insufficient documentation

## 2023-11-27 DIAGNOSIS — Z8041 Family history of malignant neoplasm of ovary: Secondary | ICD-10-CM | POA: Insufficient documentation

## 2023-11-27 DIAGNOSIS — Z803 Family history of malignant neoplasm of breast: Secondary | ICD-10-CM | POA: Insufficient documentation

## 2023-11-27 NOTE — Progress Notes (Signed)
 [facility]  [phone] [phone]    ID: Dominique Cannon DOB: 12/31/1985  MR#: [id number]  RDW#:[id number]  Patient Care Team: Velma Raisin, MD as PCP - General (Family Medicine) Magrinat, Sandria BROCKS, MD (Inactive) as Consulting Physician (Oncology) Cleotilde Ronal RAMAN, MD as Consulting Physician (Gynecology) Fair, Mitzie SAILOR, MD (Inactive) as Consulting Physician (Family Medicine)  OTHER MD:  CHIEF COMPLAINT: BRCA 2 positivity  CURRENT TREATMENT: Intensified screening  INTERVAL HISTORY:  Discussed the use of AI scribe software for clinical note transcription with the patient, who gave verbal consent to proceed.  History of Present Illness    Dominique Cannon returns today for follow-up of her BRCA positivity.  The patient, a 38 year old with a known BRCA2 gene mutation, presents for a routine follow-up. She has a significant family history of breast and ovarian cancer. The patient's mother was diagnosed with breast cancer at the age of 61, and there is a history of ovarian cancer in older relatives. The patient is currently undergoing regular breast cancer screening with MRI and mammograms, which have been unremarkable to date. She is considering risk-reducing surgeries, including bilateral mastectomy and salpingo-oophorectomy, but is concerned about the potential need for hormone replacement therapy post-surgery and its associated risks. The patient is also considering the impact of these decisions on her family life, as she has two young children.  REVIEW OF SYSTEMS:  Dominique Cannon is now working from home as is her husband. COVID 19 VACCINATION STATUS: Pfizer x3, last 08/2021   HISTORY OF PRESENT ILLNESS: From the original intake note:  Dominique Cannon has a significant family history suggesting a hereditary breast cancer syndrome and her gynecologist Willy Bolster working with Dr. Nikki referred her for genetic evaluation. 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 The other 19 genes tested were unremarkable.  Her subsequent history is as detailed below   PAST MEDICAL HISTORY: Past Medical History:  Diagnosis Date   BRCA2 positive    Family history of breast cancer in mother    mother age 63 died of breast cancer   Family history of ovarian cancer     PAST SURGICAL HISTORY: Past Surgical History:  Procedure Laterality Date   DILATION AND EVACUATION N/A 01/16/2017   Procedure: DILATATION AND EVACUATION;  Surgeon: Ronal RAMAN Cleotilde, MD;  Location: [facility];  Service: Gynecology;  Laterality: N/A;   WISDOM TOOTH EXTRACTION      FAMILY HISTORY Family History  Problem Relation Age of Onset   Breast cancer Mother 31   Ovarian cancer Maternal Grandmother        dx in her 19s   Breast cancer Paternal Grandmother    Cancer Maternal Uncle        not sure what type   Other Other        positive genetic testing for the gene   Breast cancer Other        MGF's sister   Breast cancer Other        MGF's mother  The patient's mother died at the age of 60 from breast cancer. The patient's mother had one sister, in good health. The patient's mother's mother has a history of ovarian cancer. One maternal great aunt also had a history of breast cancer. A second has tested positive for a gene mutation, but we do not have those records. On the paternal side there is also a history of breast cancer, likely postmenopausal  GYNECOLOGIC HISTORY:  No LMP recorded. (Menstrual status: IUD). Menarche age 37, the patient is GX P2. She uses anIUD for contraception.    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  Was. Her husband, [name], is also a warden/ranger, and band leader but is currently working for [employer] from home.  Dominique Cannon gave  birth to her son, Dominique Cannon, on 07/24/2018 and her second son Dominique Cannon on 02/05/2020.   ADVANCED DIRECTIVES: In the absence of any documentation to the contrary, the patient's spouse is their HCPOA.    HEALTH MAINTENANCE: Social History   Tobacco Use   Smoking status: Never   Smokeless tobacco: Never  Vaping Use   Vaping status: Never Used  Substance Use Topics   Alcohol use: Not Currently    Comment: socially barely    Drug use: No     Colonoscopy: n/a (age)  PAP:  Bone density: n/a (age)  No Known Allergies  Current Outpatient Medications  Medication Sig Dispense Refill   PARAGARD  INTRAUTERINE COPPER  IU by Intrauterine route.     No current facility-administered medications for this visit.    OBJECTIVE: white woman who appears well  Vitals:   11/27/23 1006  BP: 122/72  Pulse: 74  Resp: 16  Temp: 98.2 F (36.8 C)  SpO2: 100%   Wt Readings from Last 3 Encounters:  11/27/23 132 lb 8 oz (60.1 kg)  10/31/23 130 lb (59 kg)  05/23/23 134 lb 3.2 oz (60.9 kg)   Body mass index is 20.15 kg/m.    ECOG FS:1 - Symptomatic but completely ambulatory  Sclerae unicteric, EOMs intact Bilateral breast exam done.  Scattered density noted.  No obvious palpable masses or regional adenopathy. No lower extremity edema.   LAB RESULTS:  CMP     Component Value Date/Time   NA 136 08/04/2019 1444   K 3.8 08/04/2019 1444   CL 104 08/04/2019 1444   CO2 24 08/04/2019 1444   GLUCOSE 105 (H) 08/04/2019 1444   BUN 9 08/04/2019 1444   CREATININE 0.52 08/04/2019 1444   CALCIUM 9.4 08/04/2019 1444   PROT 7.0 08/04/2019 1444   ALBUMIN 4.1 08/04/2019 1444   AST 16 08/04/2019 1444   ALT 12 08/04/2019 1444   ALKPHOS 45 08/04/2019 1444   BILITOT 0.5 08/04/2019 1444   GFRNONAA >60 08/04/2019 1444   GFRAA >60 08/04/2019 1444    INo results found for: SPEP, UPEP  Lab Results  Component Value Date   WBC 12.1 (H) 02/06/2020   NEUTROABS 5.9 08/05/2019   HGB 9.3 (L) 02/06/2020   HCT  30.0 (L) 02/06/2020   MCV 82.9 02/06/2020   PLT 287 02/06/2020      Chemistry      Component Value Date/Time   NA 136 08/04/2019 1444   K 3.8 08/04/2019 1444   CL 104 08/04/2019 1444   CO2 24 08/04/2019 1444   BUN 9 08/04/2019 1444   CREATININE 0.52 08/04/2019 1444      Component Value Date/Time   CALCIUM 9.4 08/04/2019 1444   ALKPHOS 45 08/04/2019 1444   AST 16 08/04/2019 1444   ALT 12 08/04/2019 1444   BILITOT 0.5 08/04/2019 1444       No results found for: LABCA2  No components found for: LABCA125  No results for input(s): INR in the last 168 hours.  Urinalysis No results found for: COLORURINE, APPEARANCEUR, LABSPEC,  PHURINE, GLUCOSEU, HGBUR, BILIRUBINUR, KETONESUR, PROTEINUR, UROBILINOGEN, NITRITE, LEUKOCYTESUR   STUDIES: MR BREAST BILATERAL W WO CONTRAST INC CAD Result Date: 11/16/2023 CLINICAL DATA:  High risk screening. Patient carries the BRCA2 gene mutation. Also with a family history of breast carcinoma, mother in her early 50s as well as her maternal and paternal grandmothers. EXAM: BILATERAL BREAST MRI WITH AND WITHOUT CONTRAST TECHNIQUE: Multiplanar, multisequence MR images of both breasts were obtained prior to and following the intravenous administration of 6 ml of Gadavist  Three-dimensional MR images were rendered by post-processing of the original MR data on an independent workstation. The three-dimensional MR images were interpreted, and findings are reported in the following complete MRI report for this study. Three dimensional images were evaluated at the independent interpreting workstation using the DynaCAD thin client. COMPARISON:  Prior exams including previous breast MRIs, most recent dated 10/20/2022. FINDINGS: Breast composition: c. Heterogeneous fibroglandular tissue. Background parenchymal enhancement: Moderate. Right breast: No mass or abnormal enhancement. Scattered small benign cysts. Left breast: No mass or abnormal  enhancement. Few small benign cysts. Lymph nodes: No abnormal appearing lymph nodes. Ancillary findings:  None. IMPRESSION: 1. No MRI evidence malignancy either breast. 2. Small benign bilateral breast cysts. RECOMMENDATION: 1. Annual screening mammography, last screening study performed on 04/23/2023. 2. Supplemental high risk screening breast MRI in 1 year. BI-RADS CATEGORY  2: Benign. Electronically Signed   By: Alm Parkins M.D.   On: 11/16/2023 08:49   US  PELVIS TRANSVAGINAL NON-OB (TV ONLY) Result Date: 10/31/2023 CLINICAL DATA:  BRCA 2 positive  EXAM: TRANSVAGINAL ULTRASOUND OF PELVIS  TECHNIQUE: Transvaginal ultrasound examination of the pelvis was performed for imaging of the pelvis including uterus, ovaries, adnexal regions, and pelvic cul-de-sac.   FINDINGS: Uterus: 9.5 x 6.0 x 5.0cm.  Volume: 151.34ml  Endometrial thickness:  1.91mm with IUD in correct location.  Right ovary:  3.3 x 1.5 x 1.5cm.  Volume:  4.48ml  Left ovary:  3.6 x 2.2 x 3.1cm with 2.3 x 1.5cm.  Volume:  13.53ml  Other findings:  Cervix WNL.  Trace free fluid present.      ASSESSMENT: 38 y.o. BRCA 2 positive [place], [place] woman with brest density category D   (1) patient has decided against bilateral mastectomies and BSO, at least until child-bearing and nursing are complete  (2) patient opted against tamoxifen for risk reduction  (3) breast cancer screening: yearly mammogram and MRI, preferably 6 months apart to start at the end of breast-feeding, April 2022  (a) biannual breas   t exam   (4) ovarian cancer screening: pelvic US  every 6 months with CA 125 with Dr. Cleotilde      PLAN:  Breshae has a very good understanding of the surveillance plan and she is following it without difficulty. Specifically she sees Dr. Cleotilde every 6 months for the ovarian screening and at some point she will undergo bilateral salpingo-oophorectomy. She continues with mammograms alternating with breast MRI.  Most recent breast MRI neg  for malignancy, ordered screening mammogram for summer.  She will continue to follow-up with Dr. Cleotilde twice a year.  BRCA2 mutation carrier Discussed the risk of breast and ovarian cancer. Patient is considering prophylactic surgeries. Family history of early onset breast cancer in mother and ovarian cancer in grandmother. -Continue intensive screening with MRIs and mammograms. -Refer to breast surgeon and plastic surgeon for consultation regarding prophylactic mastectomy and reconstruction. -Consider prophylactic salpingo-oophorectomy around age 24, after potential mastectomy and reconstruction. - She can consider hormone replacement therapy post-surgery  if no evidence of breast cancer is found.  Skin lesions Noted by gynecologist, potential risk of melanoma due to BRCA2 mutation. -Schedule annual dermatology follow-up for skin examination.  General Health Maintenance -Continue biannual follow-ups with gynecologist and oncologist. -Schedule mammogram for summer.  Total time spent: 30 minutes  *Total Encounter Time as defined by the Centers for Medicare and Medicaid Services includes, in addition to the face-to-face time of a patient visit (documented in the note above) non-face-to-face time: obtaining and reviewing outside history, ordering and reviewing medications, tests or procedures, care coordination (communications with other health care professionals or caregivers) and documentation in the medical record.

## 2023-12-05 ENCOUNTER — Encounter: Payer: Self-pay | Admitting: Hematology and Oncology

## 2024-04-24 ENCOUNTER — Ambulatory Visit
Admission: RE | Admit: 2024-04-24 | Discharge: 2024-04-24 | Disposition: A | Discharge status: Home / Self Care | Payer: 59 | Source: Ambulatory Visit | Attending: Hematology and Oncology | Admitting: Hematology and Oncology

## 2024-04-24 DIAGNOSIS — Z1501 Genetic susceptibility to malignant neoplasm of breast: Secondary | ICD-10-CM

## 2024-04-24 DIAGNOSIS — Z9189 Other specified personal risk factors, not elsewhere classified: Secondary | ICD-10-CM

## 2024-04-28 ENCOUNTER — Other Ambulatory Visit (HOSPITAL_BASED_OUTPATIENT_CLINIC_OR_DEPARTMENT_OTHER): Payer: Self-pay | Admitting: Obstetrics & Gynecology

## 2024-04-28 ENCOUNTER — Encounter (HOSPITAL_BASED_OUTPATIENT_CLINIC_OR_DEPARTMENT_OTHER): Payer: Self-pay | Admitting: Obstetrics & Gynecology

## 2024-04-28 DIAGNOSIS — Z1509 Genetic susceptibility to other malignant neoplasm: Secondary | ICD-10-CM

## 2024-04-28 DIAGNOSIS — Z9189 Other specified personal risk factors, not elsewhere classified: Secondary | ICD-10-CM

## 2024-05-21 ENCOUNTER — Ambulatory Visit (HOSPITAL_BASED_OUTPATIENT_CLINIC_OR_DEPARTMENT_OTHER): Admitting: Obstetrics & Gynecology

## 2024-05-21 ENCOUNTER — Other Ambulatory Visit (HOSPITAL_BASED_OUTPATIENT_CLINIC_OR_DEPARTMENT_OTHER): Payer: Self-pay | Admitting: Obstetrics & Gynecology

## 2024-05-21 ENCOUNTER — Ambulatory Visit (HOSPITAL_BASED_OUTPATIENT_CLINIC_OR_DEPARTMENT_OTHER)

## 2024-05-21 VITALS — BP 107/75 | HR 67 | Wt 133.8 lb

## 2024-05-21 DIAGNOSIS — Z9189 Other specified personal risk factors, not elsewhere classified: Secondary | ICD-10-CM | POA: Diagnosis not present

## 2024-05-21 DIAGNOSIS — Z1501 Genetic susceptibility to malignant neoplasm of breast: Secondary | ICD-10-CM

## 2024-05-21 DIAGNOSIS — Z1509 Genetic susceptibility to other malignant neoplasm: Secondary | ICD-10-CM | POA: Diagnosis not present

## 2024-05-22 LAB — CA 125: Cancer Antigen (CA) 125: 39.8 U/mL — ABNORMAL HIGH (ref 0.0–38.1)

## 2024-05-23 ENCOUNTER — Other Ambulatory Visit (HOSPITAL_BASED_OUTPATIENT_CLINIC_OR_DEPARTMENT_OTHER): Payer: Self-pay | Admitting: Obstetrics & Gynecology

## 2024-05-23 ENCOUNTER — Ambulatory Visit (HOSPITAL_BASED_OUTPATIENT_CLINIC_OR_DEPARTMENT_OTHER): Payer: Self-pay | Admitting: Obstetrics & Gynecology

## 2024-05-23 DIAGNOSIS — Z9189 Other specified personal risk factors, not elsewhere classified: Secondary | ICD-10-CM

## 2024-05-24 ENCOUNTER — Encounter (HOSPITAL_BASED_OUTPATIENT_CLINIC_OR_DEPARTMENT_OTHER): Payer: Self-pay | Admitting: Obstetrics & Gynecology

## 2024-05-24 NOTE — Progress Notes (Signed)
   F/u after Ultrasound Patient name: Dominique Cannon MRN [id number]  Date of birth: [birth year] Chief Complaint:   Follow-up  History of Present Illness:   Dominique Cannon is a 38 y.o. [phone] Caucasian female being seen for ultrasound due to hx of BRCA2 gene mutation.  Ca-125 will be ordered today as well.  Uterus 8.6 x 6.0 x 4.0 with IUD in correct location.  Ovaries normal with normal follicles.  No free fluid in PCDS present.   She is contemplating risk reducing mastectomy.  Was referred to Plastic Surgery last year but never called with appt.  Will reach out to Dr. Ebbie about pt and process.    Review of Systems:   Pertinent items are noted in HPI Denies any pelvic pain Pertinent History Reviewed:  Reviewed past medical,surgical, social and family history.  Reviewed problem list, medications and allergies. Physical Assessment:   Vitals:   05/21/24 0828  BP: 107/75  Pulse: 67  SpO2: 100%  Weight: 133 lb 12.8 oz (60.7 kg)  Body mass index is 20.34 kg/m.        Physical Examination:   General appearance - well appearing, and in no distress  Mental status - alert, oriented to person, place, and time  Psych:  She has a normal mood and affect    Assessment & Plan:  1. BRCA2 gene mutation positive (Primary) - repeat ultrasound and ca-125 in 6 months.  Considering risk reducing oophorectomy closer to age 70 - CA 97 - Ambulatory referral to General Surgery to Dr. Ebbie for discussion of risk reducing mastectomy.   Orders Placed This Encounter  Procedures   CA 125   Ambulatory referral to General Surgery    Follow-up: Return in about 6 months (around 11/21/2024) for pt needs AEX scheduled after 10/30/2024 this year.  thank you.  Dominique Ronal GORMAN Cleotilde, MD 05/24/2024 5:24 PM

## 2024-05-26 ENCOUNTER — Ambulatory Visit: Payer: 59 | Admitting: Hematology and Oncology

## 2024-06-04 ENCOUNTER — Telehealth: Payer: Self-pay | Admitting: Hematology and Oncology

## 2024-06-04 ENCOUNTER — Encounter: Payer: Self-pay | Admitting: Hematology and Oncology

## 2024-06-04 NOTE — Telephone Encounter (Signed)
 Left patient a vm regarding upcoming appointment

## 2024-06-11 ENCOUNTER — Ambulatory Visit: Admitting: Hematology and Oncology

## 2024-06-16 ENCOUNTER — Other Ambulatory Visit (HOSPITAL_BASED_OUTPATIENT_CLINIC_OR_DEPARTMENT_OTHER): Payer: Self-pay | Admitting: Certified Nurse Midwife

## 2024-06-16 ENCOUNTER — Other Ambulatory Visit (HOSPITAL_BASED_OUTPATIENT_CLINIC_OR_DEPARTMENT_OTHER): Payer: Self-pay

## 2024-06-16 DIAGNOSIS — Z9189 Other specified personal risk factors, not elsewhere classified: Secondary | ICD-10-CM

## 2024-06-17 LAB — CA 125: Cancer Antigen (CA) 125: 57 U/mL — ABNORMAL HIGH (ref 0.0–38.1)

## 2024-06-18 ENCOUNTER — Encounter (HOSPITAL_BASED_OUTPATIENT_CLINIC_OR_DEPARTMENT_OTHER): Payer: Self-pay | Admitting: Obstetrics & Gynecology

## 2024-06-18 ENCOUNTER — Ambulatory Visit (HOSPITAL_BASED_OUTPATIENT_CLINIC_OR_DEPARTMENT_OTHER): Payer: Self-pay | Admitting: Obstetrics & Gynecology

## 2024-06-18 DIAGNOSIS — G8929 Other chronic pain: Secondary | ICD-10-CM

## 2024-06-18 DIAGNOSIS — Z1501 Genetic susceptibility to malignant neoplasm of breast: Secondary | ICD-10-CM

## 2024-06-18 DIAGNOSIS — R971 Elevated cancer antigen 125 [CA 125]: Secondary | ICD-10-CM

## 2024-06-19 NOTE — Progress Notes (Unsigned)
 Dominique [facility] [address] [place] [address] Phone: [phone] Subjective:   ISusannah Cannon, am serving as a scribe for Dr. Arthea Claudene.  I'm seeing this patient by the request  of:  Miller MD   CC: low back pain   YEP:Dlagzrupcz  Dominique Cannon is a 38 y.o. female coming in with complaint of LBP. Recently had LBP this past weekend and has been taking a steroid to help with the pain. Started last Dec when picking up laundry, in April couldn't get out of bed. [facility] to [facility] and got x[facility], were clear. Returned to doing weekly activity. Feels sore before. Radiating pain L side into hip and thigh, but not past mid thigh. There has been numbness or tingling. Pain is located over SI and pain is more seizing than stabbing.   Past medical history significant for a high risk ovarian cancer with continued increasing of CA125.      Past Medical History:  Diagnosis Date   BRCA2 positive    Family history of breast cancer in mother    mother age 17 died of breast cancer   Family history of ovarian cancer    Past Surgical History:  Procedure Laterality Date   DILATION AND EVACUATION N/A 01/16/2017   Procedure: DILATATION AND EVACUATION;  Surgeon: Ronal GORMAN Pinal, MD;  Location: [facility];  Service: Gynecology;  Laterality: N/A;   WISDOM TOOTH EXTRACTION     Social History   Socioeconomic History   Marital status: Married    Spouse name: Dominique Cannon   Number of children: 2   Years of education: Bachelors   Highest education level: Not on file  Occupational History   Not on file  Tobacco Use   Smoking status: Never   Smokeless tobacco: Never  Vaping Use   Vaping status: Never Used  Substance and Sexual Activity   Alcohol use: Not Currently    Comment: socially barely    Drug use: No   Sexual activity: Yes    Partners: Male    Birth control/protection: I.U.D.  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      Family: Dominique Cannon (2019) and Dominique Cannon (2021)       Enjoys: cook and bake, being outside, gardening      Exercise: walks the dog, active with the kids   Diet: not great, breast feeding and having a hardtime      Safety   Seat belts: Yes    Guns: No   Safe in relationships: Yes    Social Drivers of Corporate investment banker Strain: Not on file  Food Insecurity: Not on file  Transportation Needs: Not on file  Physical Activity: Not on file  Stress: Not on file  Social Connections: Not on file   No Known Allergies Family History  Problem Relation Age of Onset   Breast cancer Mother 30   Ovarian cancer Maternal Grandmother        dx in her 80s   Breast cancer Paternal Grandmother    Cancer Maternal Uncle        not sure what type   Other Other        positive genetic testing for the gene   Breast cancer Other  MGF's sister   Breast cancer Other        MGF's mother    Current Outpatient Medications (Endocrine & Metabolic):    PARAGARD  INTRAUTERINE COPPER  IU, by Intrauterine route.    Current Outpatient Medications (Analgesics):    meloxicam  (MOBIC ) 15 MG tablet, Take 1 tablet (15 mg total) by mouth daily.     Reviewed prior external information including notes and imaging from  primary care provider As well as notes that were available from care everywhere and other healthcare systems.  Past medical history, social, surgical and family history all reviewed in electronic medical record.  No pertanent information unless stated regarding to the chief complaint.   Review of Systems:  No headache, visual changes, nausea, vomiting, diarrhea, constipation, dizziness, abdominal pain, skin rash, fevers, chills, night sweats, weight loss, swollen lymph nodes,, joint swelling, chest pain, shortness of breath, mood changes. POSITIVE  muscle aches, body aches  Objective  Blood pressure 128/86, pulse 88, height 5' 8 (1.727 m), weight 133 lb (60.3 kg), SpO2 97%.   General: No apparent distress alert and oriented x3 mood and affect normal, dressed appropriately.  HEENT: Pupils equal, extraocular movements intact  Respiratory: Patient's speak in full sentences and does not appear short of breath  Cardiovascular: No lower extremity edema, non tender, no erythema  Low back exam shows patient does have severe tightness noted.  Patient does have prominence of the sacroiliac joint noted as well as L5 as well as the coccyx.  Worsening pain with straight leg test but minimal radicular symptoms.  Worsening pain in the quad with flexion.  Deep tendon reflexes are intact.  Patient has limited range of motion of the low back in multiple different areas.    Impression and Recommendations:    The above documentation has been reviewed and is accurate and complete Latanya Hemmer M Rydell Wiegel, DO

## 2024-06-20 ENCOUNTER — Ambulatory Visit

## 2024-06-20 ENCOUNTER — Encounter: Payer: Self-pay | Admitting: Family Medicine

## 2024-06-20 ENCOUNTER — Ambulatory Visit: Admitting: Family Medicine

## 2024-06-20 VITALS — BP 128/86 | HR 88 | Ht 68.0 in | Wt 133.0 lb

## 2024-06-20 DIAGNOSIS — M2550 Pain in unspecified joint: Secondary | ICD-10-CM | POA: Diagnosis not present

## 2024-06-20 DIAGNOSIS — Z9189 Other specified personal risk factors, not elsewhere classified: Secondary | ICD-10-CM

## 2024-06-20 DIAGNOSIS — Z1501 Genetic susceptibility to malignant neoplasm of breast: Secondary | ICD-10-CM

## 2024-06-20 DIAGNOSIS — M545 Low back pain, unspecified: Secondary | ICD-10-CM | POA: Diagnosis not present

## 2024-06-20 DIAGNOSIS — M5442 Lumbago with sciatica, left side: Secondary | ICD-10-CM

## 2024-06-20 DIAGNOSIS — Z1509 Genetic susceptibility to other malignant neoplasm: Secondary | ICD-10-CM

## 2024-06-20 LAB — COMPREHENSIVE METABOLIC PANEL WITH GFR
ALT: 10 U/L (ref 0–35)
AST: 11 U/L (ref 0–37)
Albumin: 4.9 g/dL (ref 3.5–5.2)
Alkaline Phosphatase: 41 U/L (ref 39–117)
BUN: 14 mg/dL (ref 6–23)
CO2: 31 meq/L (ref 19–32)
Calcium: 9.9 mg/dL (ref 8.4–10.5)
Chloride: 101 meq/L (ref 96–112)
Creatinine, Ser: 0.7 mg/dL (ref 0.40–1.20)
GFR: 111.26 mL/min (ref >60.00)
Glucose, Bld: 104 mg/dL — ABNORMAL HIGH (ref 70–99)
Potassium: 4.1 meq/L (ref 3.5–5.1)
Sodium: 143 meq/L (ref 135–145)
Total Bilirubin: 0.4 mg/dL (ref 0.2–1.2)
Total Protein: 7.6 g/dL (ref 6.0–8.3)

## 2024-06-20 LAB — CBC WITH DIFFERENTIAL/PLATELET
Basophils Absolute: 0 K/uL (ref 0.0–0.1)
Basophils Relative: 0.6 % (ref 0.0–3.0)
Eosinophils Absolute: 0 K/uL (ref 0.0–0.7)
Eosinophils Relative: 0.4 % (ref 0.0–5.0)
HCT: 41.6 % (ref 36.0–46.0)
Hemoglobin: 13.8 g/dL (ref 12.0–15.0)
Lymphocytes Relative: 23.5 % (ref 12.0–46.0)
Lymphs Abs: 1.9 K/uL (ref 0.7–4.0)
MCHC: 33.1 g/dL (ref 30.0–36.0)
MCV: 88.7 fl (ref 78.0–100.0)
Monocytes Absolute: 0.3 K/uL (ref 0.1–1.0)
Monocytes Relative: 4.3 % (ref 3.0–12.0)
Neutro Abs: 5.6 K/uL (ref 1.4–7.7)
Neutrophils Relative %: 71.2 % (ref 43.0–77.0)
Platelets: 369 K/uL (ref 150.0–400.0)
RBC: 4.69 Mil/uL (ref 3.87–5.11)
RDW: 12.8 % (ref 11.5–15.5)
WBC: 7.9 K/uL (ref 4.0–10.5)

## 2024-06-20 LAB — SEDIMENTATION RATE: Sed Rate: 16 mm/h (ref 0–20)

## 2024-06-20 LAB — LACTATE DEHYDROGENASE: LDH: 133 U/L (ref 100–200)

## 2024-06-20 MED ORDER — MELOXICAM 15 MG PO TABS: 15.0000 mg | ORAL_TABLET | Freq: Every day | ORAL | 0 refills | Status: DC

## 2024-06-20 NOTE — Patient Instructions (Addendum)
 Labs today [facility] [place] When we receive your results we will contact you.  Get CT after MRI's See you again in 2- 3 weeks (okay to double) Meloxicam  10 days then as needed

## 2024-06-20 NOTE — Assessment & Plan Note (Addendum)
 Patient is having's time pain noted.  Unfortunately does have significant amount of tightness in the lumbar spine where patient does not have any type of flexion noted.  Differential is quite broad.  There is a possibility of patient having some ankylosing occurring secondary to the severe tightness.  Could be just muscle tightness noted.  Discussed with patient at great length.  Has had a increasing in patient's CA125 recently as well.  Feel that further imaging is necessary.  At this point I do think an MRI of the lumbar spine and pelvis with contrast is necessary for further evaluation.  Depending on findings we can discuss with patient then about different treatment options.  Laboratory workup also ordered to further evaluate other type of tumor markers that could be potentially contributing as well.  Patient is a high risk ovarian cancer patient.  Follow-up after imaging to discuss further.  Meloxicam  15 mg prescribed as well

## 2024-06-21 ENCOUNTER — Ambulatory Visit: Payer: Self-pay | Admitting: Family Medicine

## 2024-06-23 ENCOUNTER — Telehealth (HOSPITAL_BASED_OUTPATIENT_CLINIC_OR_DEPARTMENT_OTHER): Payer: Self-pay

## 2024-06-23 NOTE — Telephone Encounter (Addendum)
-----   Message from Ronal GORMAN Pinal sent at 06/20/2024  9:27 PM EDT ----- Dominique Cannon, This is the pt who needs the CT of the abd/pelvis with the mildly elevated ca-125 and who is BRCA 1 positive.  She was seen by Dr. Darlyn Sharps today.  Can you work on getting the CT abd/pelvic precet done and CT scheduled.

## 2024-06-23 NOTE — Telephone Encounter (Signed)
 Spoke with Dominique Cannon at [employer]. CT Abd/Pelvis with contrast has been approved.   Reference number to the phone call: [id number]  Approval ID: [id number]  Good from 06/23/24 - 08/07/24  Spoke with patient. Patient is scheduled for MRI of the lumbar spine and pelvis on 07/04/24 9:00 am at [facility]. Patient has been notified of approval for CT abd/pelvic with contrast. CT abd/pelvis with contrast is scheduled at [facility] on 07/16/24 at 10 am. Patient is to pick 2 contrast 2 days prior to her appointment. Will drink 1 bottle at 8 am and 1 bottle at 9 am. No food 4 hours prior to appointment but is able to drink fluids. She will receive instructions when she pick up contrast as well. Needs to cancel appointment 24 hours in advance to prevent a 75 dollar fee.

## 2024-06-24 NOTE — Telephone Encounter (Signed)
 Spoke with patient. Advised CT abd/pelvis with contrast is scheduled 07/16/24 at 10 am at [facility]. Patient is to pick 2 contrast 2 days prior to her appointment. Will drink 1 bottle at 8 am and 1 bottle at 9 am. No food 4 hours prior to appointment but is able to drink fluids. She will receive instructions when she pick up contrast as well. Needs to cancel appointment 24 hours in advance to prevent a 75 dollar fee. Patient verbalizes understanding.

## 2024-06-25 LAB — AFP TUMOR MARKER: AFP-Tumor Marker: 2.4 ng/mL

## 2024-06-25 LAB — CEA: CEA: <2 ng/mL

## 2024-06-25 LAB — CANCER ANTIGEN 19-9: CA 19-9: 38 U/mL — ABNORMAL HIGH (ref <34)

## 2024-06-25 LAB — ANA: Anti Nuclear Antibody (ANA): NEGATIVE

## 2024-06-25 LAB — HLA-B27 ANTIGEN: HLA-B27 Antigen: NEGATIVE

## 2024-07-03 NOTE — Progress Notes (Signed)
 [name] [address] [place] [address] Phone: [phone] Subjective:   LILLETTE Berwyn Posey, am serving as a scribe for Dr. Arthea Claudene.  I'm seeing this patient by the request  of:  Velma Raisin, MD  CC: Neck pain follow-up  YEP:Dlagzrupcz  06/20/2024 Patient is having's time pain noted.  Unfortunately does have significant amount of tightness in the lumbar spine where patient does not have any type of flexion noted.  Differential is quite broad.  There is a possibility of patient having some ankylosing occurring secondary to the severe tightness.  Could be just muscle tightness noted.  Discussed with patient at great length.  Has had a increasing in patient's CA125 recently as well.  Feel that further imaging is necessary.  At this point I do think an MRI of the lumbar spine and pelvis with contrast is necessary for further evaluation.  Depending on findings we can discuss with patient then about different treatment options.  Laboratory workup also ordered to further evaluate other type of tumor markers that could be potentially contributing as well.  Patient is a high risk ovarian cancer patient.  Follow-up after imaging to discuss further.  Meloxicam  15 mg prescribed as well     Updated 08/08/2024 Leah Thornberry is a 38 y.o. female coming in with complaint of LBP. MRI results pending. Patient states that she took meloxicam  for 10 days. Feeling good. Has pain with longer walks.    Due to patient's history of elevated CA125 we did get an MRI of the pelvis with contrast as well as MRI of the lumbar spine.  This was independently visualized by me showing the patient does have a protruding disc at the L5-S1 area that is consistent with patient's pain.    Past Medical History:  Diagnosis Date   BRCA2 positive    Family history of breast cancer in mother    mother age 77 died of breast cancer   Family history of ovarian cancer    Past Surgical History:   Procedure Laterality Date   DILATION AND EVACUATION N/A 01/16/2017   Procedure: DILATATION AND EVACUATION;  Surgeon: Ronal GORMAN Pinal, MD;  Location: [facility];  Service: Gynecology;  Laterality: N/A;   WISDOM TOOTH EXTRACTION     Social History   Socioeconomic History   Marital status: Married    Spouse name: Drew   Number of children: 2   Years of education: Bachelors   Highest education level: Not on file  Occupational History   Not on file  Tobacco Use   Smoking status: Never   Smokeless tobacco: Never  Vaping Use   Vaping status: Never Used  Substance and Sexual Activity   Alcohol use: Not Currently    Comment: socially barely    Drug use: No   Sexual activity: Yes    Partners: Male    Birth control/protection: I.U.D.  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      Family: [name] (2019) and [name] (2021)       Enjoys: cook and bake, being outside, gardening      Exercise: walks the dog, active with the kids   Diet: not great, breast feeding and having a hardtime      Safety  Seat belts: Yes    Guns: No   Safe in relationships: Yes    Social Drivers of Corporate investment banker Strain: Not on file  Food Insecurity: Not on file  Transportation Needs: Not on file  Physical Activity: Not on file  Stress: Not on file  Social Connections: Not on file   No Known Allergies Family History  Problem Relation Age of Onset   Breast cancer Mother 33   Ovarian cancer Maternal Grandmother        dx in her 67s   Breast cancer Paternal Grandmother    Cancer Maternal Uncle        not sure what type   Other Other        positive genetic testing for the gene   Breast cancer Other        MGF's sister   Breast cancer Other        MGF's mother    Current Outpatient Medications (Endocrine &  Metabolic):    PARAGARD  INTRAUTERINE COPPER  IU, by Intrauterine route.    Current Outpatient Medications (Analgesics):    meloxicam  (MOBIC ) 15 MG tablet, Take 1 tablet (15 mg total) by mouth daily.     Reviewed prior external information including notes and imaging from  primary care provider As well as notes that were available from care everywhere and other healthcare systems.  Past medical history, social, surgical and family history all reviewed in electronic medical record.  No pertanent information unless stated regarding to the chief complaint.   Review of Systems:  No headache, visual changes, nausea, vomiting, diarrhea, constipation, dizziness, abdominal pain, skin rash, fevers, chills, night sweats, weight loss, swollen lymph nodes, body aches, joint swelling, chest pain, shortness of breath, mood changes. POSITIVE muscle aches  Objective  Blood pressure 110/80, pulse 86, height 5' 8 (1.727 m), weight 130 lb (59 kg), SpO2 100%.   General: No apparent distress alert and oriented x3 mood and affect normal, dressed appropriately.  HEENT: Pupils equal, extraocular movements intact  Respiratory: Patient's speak in full sentences and does not appear short of breath  Cardiovascular: No lower extremity edema, non tender, no erythema  Patient back exam does have some loss of lordosis noted.  Improvement noted in range of motion fairly significantly from previous exam.  Negative straight leg test noted today.  No significant abdominal pain.    Impression and Recommendations:    The above documentation has been reviewed and is accurate and complete Alexei Ey M Leani Myron, DO

## 2024-07-04 ENCOUNTER — Ambulatory Visit
Admission: RE | Admit: 2024-07-04 | Discharge: 2024-07-04 | Disposition: A | Discharge status: Home / Self Care | Source: Ambulatory Visit | Attending: Family Medicine | Admitting: Family Medicine

## 2024-07-04 DIAGNOSIS — M545 Low back pain, unspecified: Secondary | ICD-10-CM

## 2024-07-04 MED ORDER — GADOPICLENOL 0.5 MMOL/ML IV SOLN
7.5000 mL | Freq: Once | INTRAVENOUS | Status: AC | PRN
  Administered 2024-07-04: 6 mL via INTRAVENOUS

## 2024-07-08 ENCOUNTER — Ambulatory Visit: Admitting: Family Medicine

## 2024-07-08 VITALS — BP 110/80 | HR 86 | Ht 68.0 in | Wt 130.0 lb

## 2024-07-08 DIAGNOSIS — M5442 Lumbago with sciatica, left side: Secondary | ICD-10-CM | POA: Diagnosis not present

## 2024-07-08 NOTE — Assessment & Plan Note (Signed)
 Patient's MRI does show status consistent, no abnormality noted of the ovarian system at the moment.  I think at the moment we can off the meloxicam  and start formal physical therapy which the patient see how patient responds.  The possibility of injections.  An epidural that I think would be beneficial at the L5-S1.  Follow-up with me again as needed in the 8 to 12 weeks.

## 2024-07-08 NOTE — Patient Instructions (Addendum)
 Okay to start increase activity PT Referral Meloxicam  when needed See you again in 2 months

## 2024-07-16 ENCOUNTER — Other Ambulatory Visit

## 2024-07-24 ENCOUNTER — Telehealth: Payer: Self-pay | Admitting: Physical Therapy

## 2024-07-24 NOTE — Telephone Encounter (Signed)
 Called pt to inquire about moving up in schedule. Did not reach pt so left VM instructing pt to call back if she would like available slot today to complete eval earlier than next week.

## 2024-07-29 ENCOUNTER — Ambulatory Visit: Admitting: Physical Therapy

## 2024-07-30 ENCOUNTER — Ambulatory Visit: Attending: Family Medicine | Admitting: Physical Therapy

## 2024-07-30 DIAGNOSIS — M5442 Lumbago with sciatica, left side: Secondary | ICD-10-CM | POA: Diagnosis present

## 2024-07-30 DIAGNOSIS — M5459 Other low back pain: Secondary | ICD-10-CM | POA: Diagnosis present

## 2024-07-30 DIAGNOSIS — G8929 Other chronic pain: Secondary | ICD-10-CM | POA: Diagnosis present

## 2024-07-30 NOTE — Therapy (Signed)
 OUTPATIENT PHYSICAL THERAPY THORACOLUMBAR EVALUATION   Patient Name: Dominique Cannon MRN: [id number] DOB:01-18-1986, 38 y.o., female Today's Date: 07/30/2024  END OF SESSION:  PT End of Session - 07/30/24 1244     Visit Number 1    Number of Visits 24    Date for PT Re-Evaluation 10/22/24    Authorization Type [employer] Medicare  2025    Authorization - Visit Number 1    Authorization - Number of Visits 24    Progress Note Due on Visit 10    PT Start Time 0815    PT Stop Time 0900    PT Time Calculation (min) 45 min    Activity Tolerance Patient tolerated treatment well    Behavior During Therapy WFL for tasks assessed/performed          Past Medical History:  Diagnosis Date   BRCA2 positive    Family history of breast cancer in mother    mother age 6 died of breast cancer   Family history of ovarian cancer    Past Surgical History:  Procedure Laterality Date   DILATION AND EVACUATION N/A 01/16/2017   Procedure: DILATATION AND EVACUATION;  Surgeon: Ronal GORMAN Pinal, MD;  Location: [facility];  Service: Gynecology;  Laterality: N/A;   WISDOM TOOTH EXTRACTION     Patient Active Problem List   Diagnosis Date Noted   Acute lumbar back pain 06/20/2024   IUD (intrauterine device) in place 11/03/2023   Atypical glandular cells of undetermined significance (AGUS) on cervical Pap smear 11/03/2023   Breast cancer screening, high risk patient 03/03/2020   At high risk for breast cancer 03/03/2020   High risk of ovarian cancer 03/03/2020   History of shoulder dystocia in prior pregnancy, currently pregnant 12/16/2019   History of recurrent miscarriages 12/26/2017   BRCA2 positive 09/27/2015   Family history of breast cancer     PCP: Dr. Arthea Sharps    REFERRING PROVIDER: Dr. Arthea Sharps    REFERRING DIAG: [phone] (ICD-10-CM) - Acute bilateral low back pain with left-sided sciatica   Rationale for Evaluation and Treatment: Rehabilitation  THERAPY DIAG:  Other low back  pain  Chronic bilateral low back pain with left-sided sciatica  ONSET DATE: December 2024      SUBJECTIVE:                                                                                                                                                                                           SUBJECTIVE STATEMENT: See pertinent history     PERTINENT HISTORY:  Pt reports that she started experiencing pain after lifting laundry basket last year,  but it did go away for a while. When she is experiencing the severe pain, she  She now is experiencing increased low back pain again which started this past July. She saw a physician, who ordered MRI, which showed a disc herniation. She has had a marked improved in her symptoms over the past couple of weeks, and Dr. Claudene has suggested that she do physical therapy instead of further medical treatments. 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 She has avoided returning to yoga and bending down to lift objects out of fear of re-aggravating her back.    PAIN:  Are you having pain? Yes: NPRS scale: 0/10 currently, 3/10 (after going on walk)  Pain location: Lumbar paraspinals   Pain description: Dull   Aggravating factors: Taking longer walks    Relieving factors: Rest    PRECAUTIONS: None  RED FLAGS: None   WEIGHT BEARING RESTRICTIONS: No  FALLS:  Has patient fallen in last 6 months? 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  VITALS 108/82 HR 745   DIAGNOSTIC FINDINGS:  MR LUMBAR SPINE WITHOUT IV CONTRAST   COMPARISON: None available   CLINICAL HISTORY: Low back pain and pelvic pain.   TECHNIQUE: SAG T2, SAG T1, SAG STIR, AX T2, AX T1 without IV contrast.   FINDINGS: There is normal alignment of the lumbar spine. There is a mild broad-based bulge at L5-S1. There is no vertebral body height loss, subluxation or marrow replacing process. The sacrum and SI joints are unremarkable so far as visualized. Conus and cauda equina are unremarkable.   T12-L1: There is no focal disc protrusion, foraminal or spinal stenosis.   L1-2: There is no focal disc protrusion, foraminal or spinal stenosis.   L2-3: There is no focal disc protrusion, foraminal or spinal stenosis.   L3-4: There is no focal disc protrusion, foraminal or spinal stenosis.   L4-5: There is no focal disc protrusion, foraminal or spinal stenosis.   L5-S1: There is a moderate central left paracentral protrusion at L5-S1 crowding the descending nerve roots in the left lateral recess. No definitive impingement is identified. Small annular tear is present.   The retroperitoneal structures demonstrate no significant abnormality.   IMPRESSION: Left L5-S1 paracentral protrusion/extrusion with effacement of ventral thecal sac. There is mild abutment of the descending nerve roots in the descending left lateral recess without impingement. This is best seen on sagittal image 9 and axial image 38. No foraminal or spinal stenosis is present.   Electronically signed by: Norleen Satchel MD 07/05/2024 12:38 PM EDT RP Workstation: [id number]    PATIENT SURVEYS:  Modified Oswestry:  MODIFIED OSWESTRY DISABILITY SCALE  Date: 07/30/24 Score  Pain intensity 0 = I can tolerate the pain I have without having to use pain medication.  2.  Personal care (washing, dressing, etc.) 0 =  I can take care of myself normally without causing increased pain.  3. Lifting 1 = I can lift heavy weights, but it causes increased pain.  4. Walking 1 = Pain prevents me from walking more than 1 mile.  5. Sitting 1 =  I can only sit in my favorite chair as long as I like.  6. Standing 1 =  I can stand as long as I want but, it increases my pain.  7. Sleeping 0 = Pain does not prevent me from sleeping well.  8. Social Life 1 =  My social life is normal, but it increases my level of pain.  9. Traveling 0 =  I can travel anywhere without increased pain.  10. Employment/ Homemaking 1 = My normal homemaking/job activities increase my pain, but I can still perform all that is required of me  Total 6/50 (12%)    Interpretation of scores: Score Category Description  0-20% Minimal Disability The patient can cope with most living activities. Usually no treatment is indicated apart from advice on lifting, sitting and exercise  21-40% Moderate Disability The patient experiences more pain and difficulty with sitting, lifting and standing. Travel and social life are more difficult and they may be disabled from work. Personal care, sexual activity and sleeping are not grossly affected, and the patient can usually be managed by conservative means  41-60% Severe Disability Pain remains the main problem in this group, but activities of daily living are affected. These patients require a detailed investigation  61-80% Crippled Back pain impinges on all aspects of the patient's life. Positive intervention is required  81-100% Bed-bound  These patients are either bed-bound or exaggerating their symptoms  Bluford FORBES Zoe DELENA Karon DELENA, et al. Surgery versus conservative management of stable thoracolumbar fracture: the PRESTO feasibility RCT. [place] ([place]): [employer]; 2021 Nov. [facility] Technology Assessment, No. 25.62.) Appendix 3, Oswestry Disability Index  category descriptors. Available from: FindJewelers.cz  Minimally Clinically Important Difference (MCID) = 12.8%  COGNITION: Overall cognitive status: Within functional limits for tasks assessed     SENSATION: WFL  MUSCLE LENGTH: Hamstrings: Right 70 deg; Left 70 deg Thomas test: Negative Bilaterally     POSTURE: No Significant postural limitations  PALPATION: Increased tension in right lumbar paraspinal    LUMBAR ROM:   AROM eval  Flexion 100%  Extension 100%  Right lateral flexion 100%  Left lateral flexion 100%  Right rotation 100%  Left rotation 100%   (Blank rows = not tested)  LOWER EXTREMITY ROM:     Active  Right eval Left eval  Hip flexion    Hip extension    Hip abduction    Hip adduction    Hip internal rotation    Hip external rotation    Knee flexion    Knee extension    Ankle dorsiflexion    Ankle plantarflexion    Ankle inversion    Ankle eversion     (Blank rows = not tested)  LOWER EXTREMITY MMT:    MMT Right eval Left eval  Hip flexion 4+ 4+  Hip extension 4 4  Hip abduction 4 4  Hip adduction 4 4-  Hip internal rotation NT NT    Hip external rotation NT  NT   Knee flexion 4+ 4+  Knee extension 4+ 4+  Ankle dorsiflexion 4+ 4+  Ankle plantarflexion    Ankle inversion    Ankle eversion     (Blank rows = not tested)  LUMBAR  SPECIAL TESTS:  FABER test: Negative, Trendelenburg sign: Not tested, and FADIR Negative   FUNCTIONAL TESTS:  3 mile distance walk: NT    GAIT: Distance walked: 50 ft  Assistive device utilized: None Level of assistance: Complete Independence Comments: No abnormalities noted  TREATMENT DATE:  07/30/24 THEREX  Mini-Squats with mat tap 1 x 10   Mini-Squats with hip ER green band 1 x 10   Mini-Squats with hip ER green band with jug hold 1 x 10   -min VC to keep knees behind toes.         Forward Lumbar Flexion HS Stretch 2 x 60 sec                                                                                                                              PATIENT EDUCATION:  Education details: Form and technique for correct performance of exercise and explanation about underlying deficits.  Person educated: Patient Education method: Explanation, Demonstration, Verbal cues, and Handouts Education comprehension: verbalized understanding, returned demonstration, and verbal cues required  HOME EXERCISE PROGRAM: Access Code: [email or web address] URL: [email or web address] Date: 07/30/2024 Prepared by: Toribio Servant  Exercises - Standing Forward Trunk Flexion  - 1 x daily - 7 x weekly - 3 reps - 30 sec   hold - Squat with Chair Touch and Resistance Loop  - 3-4 x weekly - 3 sets - 10 reps  ASSESSMENT:  CLINICAL IMPRESSION: Patient is a 38 y.o. white female who was seen today for physical therapy evaluation and treatment for left sided low back pain in the setting of lumbar disc herniation. She shows signs and symptoms that make her most appropriate for the functional optimization group with low pain and disability. She has minor hip weakness and inflexibility along with increased lumbar paraspinal tension and increased soreness when walking longer distances. She will benefit from skilled PT to address these aforementioned deficits to return to walking longer distances and to bending down to perform home duty and childcare tasks without being limited by pain and discomfort.   OBJECTIVE IMPAIRMENTS: decreased strength, impaired flexibility, and pain.   ACTIVITY LIMITATIONS: carrying, lifting, bending, and locomotion level  PARTICIPATION LIMITATIONS: cleaning and community activity  PERSONAL FACTORS: Age, Fitness, Time since onset of injury/illness/exacerbation are also affecting patient's functional outcome.   REHAB POTENTIAL: Good  CLINICAL DECISION MAKING: Stable/uncomplicated  EVALUATION COMPLEXITY: Low   GOALS: Goals reviewed with patient?  No  SHORT TERM GOALS: Target date: 08/13/2024  Patient will demonstrate undestanding of home exercise plan by performing exercises correctly with evidence of good carry over with min to no verbal or tactile cues .   Baseline: NT   Goal status: INITIAL  2.  Patient will understand the proper biomechanics when bending forward to lift objects like her son with use of hip hinge instead of rounding her back to avoid re-aggravating disc herniation.     Baseline: NT   Goal status: INITIAL  LONG TERM GOALS: Target date: 10/22/2024  Patient will improve modified Oswestry Disability Index (MODI) score by decreasing initial score by >=13% as evidence of the minimal statistically significant change for improvement with low back pain disability and improvement in low back function (Copay et al, 2008) Baseline: 6/50 (12%) Goal status: INITIAL  2.  Patient will improve hip strength by 1/3 grade MMT (ie 4- to 4) for improved lumbar spine stability and to decrease incidence of pain exacerbation from disc herniation and to resume walking and lifting to care for her children and maintain her household without being limited by pain and discomfort.   Baseline: Hip Ext R/L 4/4, Hip Abd R/L 4/4 , Hip Add R/L 4/4-  Goal status: INITIAL  3.  Patient will improve lumbar function and decrease lumbar pain with activity as evidenced by walking >=3 miles with exceeding low back pain that is >=3/10.  Baseline: 3/10   Goal status: INITIAL   PLAN:  PT FREQUENCY: 1-2x/week  PT DURATION: 12 weeks  PLANNED INTERVENTIONS: 97164- PT Re-evaluation, 97750- Physical Performance Testing, 97110-Therapeutic exercises, 97530- Therapeutic activity, 97112- Neuromuscular re-education, 97535- Self Care, 02859- Manual therapy, [phone]- Gait training, [phone]- Aquatic Therapy, [phone]- Electrical stimulation (unattended), [phone]- Electrical stimulation (manual), [id number]- Traction (mechanical), 20560 (1-2 muscles), 20561 (3+ muscles)- Dry  Needling, Patient/Family education, Balance training, Taping, Joint mobilization, Joint manipulation, Spinal manipulation, Spinal mobilization, Vestibular training, DME instructions, Cryotherapy, and Moist heat.  PLAN FOR NEXT SESSION: Sahrmann scale, Walk around gym for gait analysis.  Show hip hinge with PVC pole for neuromuscular training and to maintain neutral spine.      Toribio Servant PT, DPT  [facility] [address] [place], [address] Phone: [phone]   Fax:  [phone]

## 2024-07-31 ENCOUNTER — Ambulatory Visit: Admitting: Physical Therapy

## 2024-08-01 ENCOUNTER — Ambulatory Visit: Admitting: Family Medicine

## 2024-08-05 ENCOUNTER — Ambulatory Visit: Admitting: Physical Therapy

## 2024-08-05 DIAGNOSIS — G8929 Other chronic pain: Secondary | ICD-10-CM

## 2024-08-05 DIAGNOSIS — M5459 Other low back pain: Secondary | ICD-10-CM

## 2024-08-05 NOTE — Therapy (Addendum)
 OUTPATIENT PHYSICAL THERAPY THORACOLUMBAR TREATMENT   Patient Name: Dominique Cannon MRN: [id number] DOB:07-Jun-1986, 38 y.o., female Today's Date: 08/05/2024  END OF SESSION:  PT End of Session - 08/05/24 1417     Visit Number 2    Number of Visits 24    Date for Recertification  10/22/24    Authorization Type [employer] Medicare  2025    Authorization - Visit Number 2    Authorization - Number of Visits 24    Progress Note Due on Visit 10    PT Start Time 1345    PT Stop Time 1430    PT Time Calculation (min) 45 min    Activity Tolerance Patient tolerated treatment well    Behavior During Therapy WFL for tasks assessed/performed           Past Medical History:  Diagnosis Date   BRCA2 positive    Family history of breast cancer in mother    mother age 11 died of breast cancer   Family history of ovarian cancer    Past Surgical History:  Procedure Laterality Date   DILATION AND EVACUATION N/A 01/16/2017   Procedure: DILATATION AND EVACUATION;  Surgeon: Ronal GORMAN Pinal, MD;  Location: [facility];  Service: Gynecology;  Laterality: N/A;   WISDOM TOOTH EXTRACTION     Patient Active Problem List   Diagnosis Date Noted   Acute lumbar back pain 06/20/2024   IUD (intrauterine device) in place 11/03/2023   Atypical glandular cells of undetermined significance (AGUS) on cervical Pap smear 11/03/2023   Breast cancer screening, high risk patient 03/03/2020   At high risk for breast cancer 03/03/2020   High risk of ovarian cancer 03/03/2020   History of shoulder dystocia in prior pregnancy, currently pregnant 12/16/2019   History of recurrent miscarriages 12/26/2017   BRCA2 positive 09/27/2015   Family history of breast cancer     PCP: Dr. Arthea Sharps    REFERRING PROVIDER: Dr. Arthea Sharps    REFERRING DIAG: [phone] (ICD-10-CM) - Acute bilateral low back pain with left-sided sciatica   Rationale for Evaluation and Treatment: Rehabilitation  THERAPY DIAG:  Other low back  pain  Chronic bilateral low back pain with left-sided sciatica  ONSET DATE: December 2024      SUBJECTIVE:                                                                                                                                                                                           SUBJECTIVE STATEMENT: See pertinent history     PERTINENT HISTORY:  Pt reports that she started experiencing pain after lifting laundry basket last  year, but it did go away for a while. When she is experiencing the severe pain, she  She now is experiencing increased low back pain again which started this past July. She saw a physician, who ordered MRI, which showed a disc herniation. She has had a marked improved in her symptoms over the past couple of weeks, and Dr. Claudene has suggested that she do physical therapy instead of further medical treatments. 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 She has avoided returning to yoga and bending down to lift objects out of fear of re-aggravating her back.    PAIN:  Are you having pain? Yes: NPRS scale: 0/10 currently, 3/10 (after going on walk)  Pain location: Lumbar paraspinals   Pain description: Dull   Aggravating factors: Taking longer walks    Relieving factors: Rest    PRECAUTIONS: None  RED FLAGS: None   WEIGHT BEARING RESTRICTIONS: No  FALLS:  Has patient fallen in last 6 months? 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  VITALS 108/82 HR 745   DIAGNOSTIC FINDINGS:  MR LUMBAR SPINE WITHOUT IV CONTRAST   COMPARISON: None available   CLINICAL HISTORY: Low back pain and pelvic pain.   TECHNIQUE: SAG T2, SAG T1, SAG STIR, AX T2, AX T1 without IV contrast.   FINDINGS: There is normal alignment of the lumbar spine. There is a mild broad-based bulge at L5-S1. There is no vertebral body height loss, subluxation or marrow replacing process. The sacrum and SI joints are unremarkable so far as visualized. Conus and cauda equina are unremarkable.   T12-L1: There is no focal disc protrusion, foraminal or spinal stenosis.   L1-2: There is no focal disc protrusion, foraminal or spinal stenosis.   L2-3: There is no focal disc protrusion, foraminal or spinal stenosis.   L3-4: There is no focal disc protrusion, foraminal or spinal stenosis.   L4-5: There is no focal disc protrusion, foraminal or spinal stenosis.   L5-S1: There is a moderate central left paracentral protrusion at L5-S1 crowding the descending nerve roots in the left lateral recess. No definitive impingement is identified. Small annular tear is present.   The retroperitoneal structures demonstrate no significant abnormality.   IMPRESSION: Left L5-S1 paracentral protrusion/extrusion with effacement of ventral thecal sac. There is mild abutment of the descending nerve roots in the descending left lateral recess without impingement. This is best seen on sagittal image 9 and axial image 38. No foraminal or spinal stenosis is present.   Electronically signed by: Norleen Satchel MD 07/05/2024 12:38 PM EDT RP Workstation: [id number]    PATIENT SURVEYS:  Modified Oswestry:  MODIFIED OSWESTRY DISABILITY SCALE  Date: 07/30/24 Score  Pain intensity 0 = I can tolerate the pain I have without having to use pain medication.  2.  Personal care (washing, dressing, etc.)  0 =  I can take care of myself normally without causing increased pain.  3. Lifting 1 = I can lift heavy weights, but it causes increased pain.  4. Walking 1 = Pain prevents me from walking more than 1 mile.  5. Sitting 1 =  I can only sit in my favorite chair as long as I like.  6. Standing 1 =  I can stand as long as I want but, it increases my pain.  7. Sleeping 0 = Pain does not prevent me from sleeping well.  8. Social Life 1 =  My social life is normal, but it increases my level of pain.  9. Traveling 0 =  I can travel anywhere without increased pain.  10. Employment/ Homemaking 1 = My normal homemaking/job activities increase my pain, but I can still perform all that is required of me  Total 6/50 (12%)    Interpretation of scores: Score Category Description  0-20% Minimal Disability The patient can cope with most living activities. Usually no treatment is indicated apart from advice on lifting, sitting and exercise  21-40% Moderate Disability The patient experiences more pain and difficulty with sitting, lifting and standing. Travel and social life are more difficult and they may be disabled from work. Personal care, sexual activity and sleeping are not grossly affected, and the patient can usually be managed by conservative means  41-60% Severe Disability Pain remains the main problem in this group, but activities of daily living are affected. These patients require a detailed investigation  61-80% Crippled Back pain impinges on all aspects of the patient's life. Positive intervention is required  81-100% Bed-bound  These patients are either bed-bound or exaggerating their symptoms  Bluford FORBES Zoe DELENA Karon DELENA, et al. Surgery versus conservative management of stable thoracolumbar fracture: the PRESTO feasibility RCT. [place] ([place]): [employer]; 2021 Nov. [facility] Technology Assessment, No. 25.62.) Appendix 3, Oswestry Disability Index  category descriptors. Available from: FindJewelers.cz  Minimally Clinically Important Difference (MCID) = 12.8%  COGNITION: Overall cognitive status: Within functional limits for tasks assessed     SENSATION: WFL  MUSCLE LENGTH: Hamstrings: Right 70 deg; Left 70 deg Thomas test: Negative Bilaterally     POSTURE: No Significant postural limitations  PALPATION: Increased tension in right lumbar paraspinal    LUMBAR ROM:   AROM eval  Flexion 100%  Extension 100%  Right lateral flexion 100%  Left lateral flexion 100%  Right rotation 100%  Left rotation 100%   (Blank rows = not tested)  LOWER EXTREMITY ROM:     Active  Right eval Left eval  Hip flexion    Hip extension    Hip abduction    Hip adduction    Hip internal rotation    Hip external rotation    Knee flexion    Knee extension    Ankle dorsiflexion    Ankle plantarflexion    Ankle inversion    Ankle eversion     (Blank rows = not tested)  LOWER EXTREMITY MMT:    MMT Right eval Left eval  Hip flexion 4+ 4+  Hip extension 4 4  Hip abduction 4 4  Hip adduction 4 4-  Hip internal rotation 4 4    Hip external rotation 4 4   Knee flexion 4+ 4+  Knee extension 4+ 4+  Ankle dorsiflexion 4+ 4+  Ankle plantarflexion    Ankle inversion    Ankle eversion     (Blank rows = not tested)  LUMBAR SPECIAL TESTS:  FABER test: Negative, Trendelenburg sign: Not tested, and FADIR Negative   FUNCTIONAL TESTS:  3 mile distance walk: NT    GAIT: Distance walked: 50 ft  Assistive device utilized: None Level of assistance: Complete Independence Comments: No abnormalities noted  TREATMENT DATE:   08/05/24:  THEREX  PSIS level in standing and with forward flexion  Hip MMT  (See Above)   Left Side Lying Hip Adduction 2 x 10    Forward Plank  2 x 30 sec hold   Upper Trap Stretch 4 x 60 sec    THERAC  Overground ambulation 5 x 100 ft  -inward rotation of left hip  Hip  Hinge with #10 KB tap on 10 inch platform 1 x 10    Ab Rollout with silver physioball 3 x 10  -Pt struggles to maintain hip extension during roll out   Forward Plank with Hip Extension 2 x 10    NMR   Hip Hinge with PVC Pipe 3 x 20    -mod VC to decrease knee flexion by maintaining knees behind toes.       07/30/24 THEREX  Mini-Squats with mat tap 1 x 10   Mini-Squats with hip ER green band 1 x 10   Mini-Squats with hip ER green band with jug hold 1 x 10   -min VC to keep knees behind toes.         Forward Lumbar Flexion HS Stretch 2 x 60 sec                                                                                                                             PATIENT EDUCATION:  Education details: Form and technique for correct performance of exercise and explanation about underlying deficits.  Person educated: Patient Education method: Explanation, Demonstration, Verbal cues, and Handouts Education comprehension: verbalized understanding, returned demonstration, and verbal cues required  HOME EXERCISE PROGRAM: Access Code: [email or web address] URL: [email or web address] Date: 08/05/2024 Prepared by: Toribio Servant  Exercises - Seated Cervical Sidebending Stretch  - 1 x daily - 7 x weekly - 3 reps - 30-60 sec  hold - Seated Cervical Sidebending Stretch (Mirrored)  - 1 x daily - 7 x weekly - 3 reps - 30-60 sec  hold - Standing Forward Trunk Flexion  - 1 x daily - 7 x weekly - 3 reps - 30 sec   hold - Sidelying Hip Adduction (Mirrored)  - 3-4 x weekly - 3 sets - 10 reps - Standing Hip Hinge with Dowel  - 5-7 x weekly - 3 sets - 20 reps - Full Plank with Hip Extension - Knee Straight  - 3-4 x weekly - 3 sets - 10 reps  ASSESSMENT:  CLINICAL IMPRESSION: Pt shows ongoing ability to tolerate hip and abdominal strengthening without triggering her low back pain. She does show internal rotation of left hip, but it is not due to hip misalignment or hip weakness. Further  testing needed to determine what is causing this deficit. She will benefit from skilled PT to address these aforementioned deficits to return to walking longer distances and to bending down to perform home duty and childcare tasks without being limited by pain and discomfort.   OBJECTIVE IMPAIRMENTS: decreased strength, impaired flexibility, and pain.   ACTIVITY LIMITATIONS: carrying, lifting, bending, and locomotion level  PARTICIPATION LIMITATIONS: cleaning and community activity  PERSONAL FACTORS: Age, Fitness, Time since onset of injury/illness/exacerbation are also affecting patient's functional outcome.   REHAB POTENTIAL: Good  CLINICAL DECISION MAKING: Stable/uncomplicated  EVALUATION COMPLEXITY: Low   GOALS: Goals reviewed with patient? No  SHORT TERM GOALS: Target date: 08/13/2024  Patient will demonstrate undestanding of home exercise plan by performing exercises correctly with evidence of good carry over with min to no verbal or tactile cues .   Baseline: NT  08/05/24: Performing exercises independently  Goal status: ACHIEVED    2.  Patient will understand the proper biomechanics when bending forward to lift objects like her son with use of hip hinge instead of rounding her back to avoid re-aggravating disc herniation.     Baseline: NT 08/05/24 Instructed on how to perform hip hinge  Goal status: ONGOING    LONG TERM GOALS: Target date: 10/22/2024  Patient will improve modified Oswestry Disability Index (MODI) score by decreasing initial score by >=13% as evidence of the minimal statistically significant change for improvement with low back pain disability and improvement in low back function (Copay et al, 2008) Baseline: 6/50 (12%) Goal status: ONGOING    2.  Patient will improve hip strength by 1/3 grade MMT (ie 4- to 4) for improved lumbar spine stability and to decrease incidence of pain exacerbation from disc herniation and to resume walking and lifting to care for  her children and maintain her household without being limited by pain and discomfort.   Baseline: Hip Ext R/L 4/4, Hip Abd R/L 4/4 , Hip Add R/L 4/4-  Goal status: ONGOING    3.  Patient will improve lumbar function and decrease lumbar pain with activity as evidenced by walking >=3 miles with exceeding low back pain that is >=3/10.  Baseline: 3/10   Goal status: ONGOING    PLAN:  PT FREQUENCY: 1-2x/week  PT DURATION: 12 weeks  PLANNED INTERVENTIONS: 97164- PT Re-evaluation, 97750- Physical Performance Testing, 97110-Therapeutic exercises, 97530- Therapeutic activity, 97112- Neuromuscular re-education, 97535- Self Care, 02859- Manual therapy, [phone]- Gait training, [phone]- Aquatic Therapy, [phone]- Electrical stimulation (unattended), [phone]- Electrical stimulation (manual), [id number]- Traction (mechanical), 20560 (1-2 muscles), 20561 (3+ muscles)- Dry Needling, Patient/Family education, Balance training, Taping, Joint mobilization, Joint manipulation, Spinal manipulation, Spinal mobilization, Vestibular training, DME instructions, Cryotherapy, and Moist heat.  PLAN FOR NEXT SESSION: Sahrmann scale. Dead lift. Test hip adductor length and Ober's test to determine cause of left knee internal rotation. Continue with hip and abdominal strengthening suitcase carry.      Toribio Servant PT, DPT  [facility] [address], [place], [address] Phone: [phone]   Fax:  [phone]

## 2024-08-07 ENCOUNTER — Ambulatory Visit: Admitting: Physical Therapy

## 2024-08-07 DIAGNOSIS — G8929 Other chronic pain: Secondary | ICD-10-CM

## 2024-08-07 DIAGNOSIS — M5459 Other low back pain: Secondary | ICD-10-CM

## 2024-08-07 NOTE — Therapy (Signed)
 OUTPATIENT PHYSICAL THERAPY THORACOLUMBAR TREATMENT   Patient Name: Dominique Cannon MRN: [id number] DOB:February 13, 1986, 38 y.o., female Today's Date: 08/07/2024  END OF SESSION:  PT End of Session - 08/07/24 1439     Visit Number 3    Number of Visits 24    Date for Recertification  10/22/24    Authorization Type [employer] Medicare  2025    Authorization - Visit Number 3    Authorization - Number of Visits 24    Progress Note Due on Visit 10    PT Start Time 1435    PT Stop Time 1515    PT Time Calculation (min) 40 min    Activity Tolerance Patient tolerated treatment well    Behavior During Therapy WFL for tasks assessed/performed            Past Medical History:  Diagnosis Date   BRCA2 positive    Family history of breast cancer in mother    mother age 28 died of breast cancer   Family history of ovarian cancer    Past Surgical History:  Procedure Laterality Date   DILATION AND EVACUATION N/A 01/16/2017   Procedure: DILATATION AND EVACUATION;  Surgeon: Ronal GORMAN Pinal, MD;  Location: [facility];  Service: Gynecology;  Laterality: N/A;   WISDOM TOOTH EXTRACTION     Patient Active Problem List   Diagnosis Date Noted   Acute lumbar back pain 06/20/2024   IUD (intrauterine device) in place 11/03/2023   Atypical glandular cells of undetermined significance (AGUS) on cervical Pap smear 11/03/2023   Breast cancer screening, high risk patient 03/03/2020   At high risk for breast cancer 03/03/2020   High risk of ovarian cancer 03/03/2020   History of shoulder dystocia in prior pregnancy, currently pregnant 12/16/2019   History of recurrent miscarriages 12/26/2017   BRCA2 positive 09/27/2015   Family history of breast cancer     PCP: Dr. Arthea Sharps    REFERRING PROVIDER: Dr. Arthea Sharps    REFERRING DIAG: [phone] (ICD-10-CM) - Acute bilateral low back pain with left-sided sciatica   Rationale for Evaluation and Treatment: Rehabilitation  THERAPY DIAG:  Other low back  pain  Chronic bilateral low back pain with left-sided sciatica  ONSET DATE: December 2024      SUBJECTIVE:                                                                                                                                                                                           SUBJECTIVE STATEMENT: Pt reports some right sided hip pain that is not common and she just started to feel this today.  PERTINENT HISTORY:  Pt reports that she started experiencing pain after lifting laundry basket last year, but it did go away for a while. When she is experiencing the severe pain, she  She now is experiencing increased low back pain again which started this past July. She saw a physician, who ordered MRI, which showed a disc herniation. She has had a marked improved in her symptoms over the past couple of weeks, and Dr. Claudene has suggested that she do physical therapy instead of further medical treatments. 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 She has avoided returning to yoga and bending down to lift objects out of fear of re-aggravating her back.    PAIN:  Are you having pain? Yes: NPRS scale: 0/10 currently, 3/10 (after going on walk)  Pain location: Lumbar paraspinals   Pain description: Dull   Aggravating factors: Taking longer walks    Relieving factors: Rest    PRECAUTIONS: None  RED FLAGS: None   WEIGHT BEARING RESTRICTIONS: No  FALLS:  Has patient fallen in last 6 months? 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  VITALS 108/82 HR 745   DIAGNOSTIC FINDINGS:  MR LUMBAR SPINE WITHOUT IV CONTRAST   COMPARISON: None available   CLINICAL HISTORY: Low back pain and pelvic pain.   TECHNIQUE: SAG T2, SAG T1, SAG STIR, AX T2, AX T1 without IV contrast.   FINDINGS: There is normal alignment of the lumbar spine. There is a mild broad-based bulge at L5-S1. There is no vertebral body height loss, subluxation or marrow replacing process. The sacrum and SI joints are unremarkable so far as visualized. Conus and cauda equina are unremarkable.   T12-L1: There is no focal disc protrusion, foraminal or spinal stenosis.   L1-2: There is no focal disc protrusion, foraminal or spinal stenosis.   L2-3: There is no focal disc protrusion, foraminal or spinal stenosis.   L3-4: There is no focal disc protrusion, foraminal or spinal stenosis.   L4-5: There is no focal disc protrusion, foraminal or spinal stenosis.   L5-S1: There is a moderate central left paracentral protrusion at L5-S1 crowding the descending nerve roots in the left lateral recess. No definitive impingement is identified. Small annular tear is present.   The retroperitoneal structures demonstrate no significant abnormality.   IMPRESSION: Left L5-S1 paracentral protrusion/extrusion with effacement of ventral thecal sac. There is mild abutment of the descending nerve roots in the descending left lateral recess without impingement. This is best seen on sagittal image 9 and axial image 38. No foraminal or spinal stenosis is present.   Electronically signed by: Norleen Satchel MD 07/05/2024 12:38 PM EDT RP Workstation: [id number]    PATIENT SURVEYS:  Modified Oswestry:  MODIFIED OSWESTRY DISABILITY SCALE  Date: 07/30/24 Score  Pain  intensity 0 = I can tolerate the pain  I have without having to use pain medication.  2. Personal care (washing, dressing, etc.) 0 =  I can take care of myself normally without causing increased pain.  3. Lifting 1 = I can lift heavy weights, but it causes increased pain.  4. Walking 1 = Pain prevents me from walking more than 1 mile.  5. Sitting 1 =  I can only sit in my favorite chair as long as I like.  6. Standing 1 =  I can stand as long as I want but, it increases my pain.  7. Sleeping 0 = Pain does not prevent me from sleeping well.  8. Social Life 1 =  My social life is normal, but it increases my level of pain.  9. Traveling 0 =  I can travel anywhere without increased pain.  10. Employment/ Homemaking 1 = My normal homemaking/job activities increase my pain, but I can still perform all that is required of me  Total 6/50 (12%)    Interpretation of scores: Score Category Description  0-20% Minimal Disability The patient can cope with most living activities. Usually no treatment is indicated apart from advice on lifting, sitting and exercise  21-40% Moderate Disability The patient experiences more pain and difficulty with sitting, lifting and standing. Travel and social life are more difficult and they may be disabled from work. Personal care, sexual activity and sleeping are not grossly affected, and the patient can usually be managed by conservative means  41-60% Severe Disability Pain remains the main problem in this group, but activities of daily living are affected. These patients require a detailed investigation  61-80% Crippled Back pain impinges on all aspects of the patient's life. Positive intervention is required  81-100% Bed-bound  These patients are either bed-bound or exaggerating their symptoms  Bluford FORBES Zoe DELENA Karon DELENA, et al. Surgery versus conservative management of stable thoracolumbar fracture: the PRESTO feasibility RCT. [place] ([place]): [employer];  2021 Nov. [facility] Technology Assessment, No. 25.62.) Appendix 3, Oswestry Disability Index category descriptors. Available from: FindJewelers.cz  Minimally Clinically Important Difference (MCID) = 12.8%  COGNITION: Overall cognitive status: Within functional limits for tasks assessed     SENSATION: WFL  MUSCLE LENGTH: Hamstrings: Right 70 deg; Left 70 deg Thomas test: Negative Bilaterally     POSTURE: No Significant postural limitations  PALPATION: Increased tension in right lumbar paraspinal    LUMBAR ROM:   AROM eval  Flexion 100%  Extension 100%  Right lateral flexion 100%  Left lateral flexion 100%  Right rotation 100%  Left rotation 100%   (Blank rows = not tested)  LOWER EXTREMITY ROM:     Active  Right eval Left eval  Hip flexion    Hip extension    Hip abduction    Hip adduction    Hip internal rotation    Hip external rotation    Knee flexion    Knee extension    Ankle dorsiflexion    Ankle plantarflexion    Ankle inversion    Ankle eversion     (Blank rows = not tested)  LOWER EXTREMITY MMT:    MMT Right eval Left eval  Hip flexion 4+ 4+  Hip extension 4 4  Hip abduction 4 4  Hip adduction 4 4-  Hip internal rotation 4 4    Hip external rotation 4 4   Knee flexion 4+ 4+  Knee extension 4+ 4+  Ankle dorsiflexion 4+ 4+  Ankle plantarflexion    Ankle  inversion    Ankle eversion     (Blank rows = not tested)  LUMBAR SPECIAL TESTS:  FABER test: Negative, Trendelenburg sign: Not tested, and FADIR Negative   FUNCTIONAL TESTS:  3 mile distance walk: NT    GAIT: Distance walked: 50 ft  Assistive device utilized: None Level of assistance: Complete Independence Comments: No abnormalities noted  TREATMENT DATE:   08/07/24:  PHYSICAL PERFORMANCE   Sahrmann: Level 5    THERAC    TM walk at 2.0 mph for 5 min   Dead lift with #10 KB 1 x 10  -min VC to decrease speed of eccentric phase and increase speed  of concentric phase     Dead lift with #15 DB 1 x 10  Dead lift with #20 KB 1 x 10    NMR   Hip Hinge in front of mirror  3 x 10    -min VC to maintain knees behind toes when hinging at hips  and to maintain extended spine                                                                                                               PATIENT EDUCATION:  Education details: Form and technique for correct performance of exercise and explanation about underlying deficits.  Person educated: Patient Education method: Explanation, Demonstration, Verbal cues, and Handouts Education comprehension: verbalized understanding, returned demonstration, and verbal cues required  HOME EXERCISE PROGRAM: Access Code: [email or web address] URL: [email or web address] Date: 08/07/2024 Prepared by: Toribio Servant  Exercises - Seated Cervical Sidebending Stretch  - 1 x daily - 7 x weekly - 3 reps - 30-60 sec  hold - Seated Cervical Sidebending Stretch (Mirrored)  - 1 x daily - 7 x weekly - 3 reps - 30-60 sec  hold - Standing Forward Trunk Flexion  - 1 x daily - 7 x weekly - 3 reps - 30 sec   hold - Sidelying Hip Adduction (Mirrored)  - 3-4 x weekly - 3 sets - 10 reps - Standing Hip Hinge with Dowel  - 5-7 x weekly - 3 sets - 20 reps - Full Plank with Hip Extension - Knee Straight  - 3-4 x weekly - 3 sets - 10 reps - Deadlift with #20 Kettlebell    - 3-4 x weekly - 3 sets - 10 reps ASSESSMENT:  CLINICAL IMPRESSION: Pt exhibits improved lifting mechanics with ability to maintain neutral spine with dead lift. She has abdominal strengthening that is beyond upper limits of Sahrmann test. She has continued to progress towards goals to the point where she only needs PT one time per week. She will benefit from skilled PT to address these aforementioned deficits to return to walking longer distances and to bending down to perform home duty and childcare tasks without being limited by pain and discomfort.        OBJECTIVE IMPAIRMENTS: decreased strength, impaired flexibility, and pain.   ACTIVITY LIMITATIONS: carrying, lifting, bending, and locomotion level  PARTICIPATION LIMITATIONS: cleaning and community activity  PERSONAL FACTORS: Age,  Fitness, Time since onset of injury/illness/exacerbation are also affecting patient's functional outcome.   REHAB POTENTIAL: Good  CLINICAL DECISION MAKING: Stable/uncomplicated  EVALUATION COMPLEXITY: Low   GOALS: Goals reviewed with patient? No  SHORT TERM GOALS: Target date: 08/13/2024  Patient will demonstrate undestanding of home exercise plan by performing exercises correctly with evidence of good carry over with min to no verbal or tactile cues .   Baseline: NT  08/05/24: Performing exercises independently  Goal status: ACHIEVED    2.  Patient will understand the proper biomechanics when bending forward to lift objects like her son with use of hip hinge instead of rounding her back to avoid re-aggravating disc herniation.     Baseline: NT 08/05/24 Instructed on how to perform hip hinge  Goal status: ONGOING    LONG TERM GOALS: Target date: 10/22/2024  Patient will improve modified Oswestry Disability Index (MODI) score by decreasing initial score by >=13% as evidence of the minimal statistically significant change for improvement with low back pain disability and improvement in low back function (Copay et al, 2008) Baseline: 6/50 (12%) Goal status: ONGOING    2.  Patient will improve hip strength by 1/3 grade MMT (ie 4- to 4) for improved lumbar spine stability and to decrease incidence of pain exacerbation from disc herniation and to resume walking and lifting to care for her children and maintain her household without being limited by pain and discomfort.   Baseline: Hip Ext R/L 4/4, Hip Abd R/L 4/4 , Hip Add R/L 4/4-  Goal status: ONGOING    3.  Patient will improve lumbar function and decrease lumbar pain with activity as evidenced by  walking >=3 miles with exceeding low back pain that is >=3/10.  Baseline: 3/10   Goal status: ONGOING    PLAN:  PT FREQUENCY: 1-2x/week  PT DURATION: 12 weeks  PLANNED INTERVENTIONS: 97164- PT Re-evaluation, 97750- Physical Performance Testing, 97110-Therapeutic exercises, 97530- Therapeutic activity, 97112- Neuromuscular re-education, 97535- Self Care, 02859- Manual therapy, [phone]- Gait training, [phone]- Aquatic Therapy, [phone]- Electrical stimulation (unattended), [phone]- Electrical stimulation (manual), [id number]- Traction (mechanical), 20560 (1-2 muscles), 20561 (3+ muscles)- Dry Needling, Patient/Family education, Balance training, Taping, Joint mobilization, Joint manipulation, Spinal manipulation, Spinal mobilization, Vestibular training, DME instructions, Cryotherapy, and Moist heat.  PLAN FOR NEXT SESSION:  Periscapular MMT and strengthening.  Continue with hip and abdominal strengthening suitcase carry and dead lift with TA activation for added spinal stability.    Toribio Servant PT, DPT  [facility] [address], [place], [address] Phone: [phone]   Fax:  [phone]

## 2024-08-08 ENCOUNTER — Telehealth (HOSPITAL_BASED_OUTPATIENT_CLINIC_OR_DEPARTMENT_OTHER): Payer: Self-pay

## 2024-08-08 ENCOUNTER — Inpatient Hospital Stay: Attending: Hematology and Oncology | Admitting: Hematology and Oncology

## 2024-08-08 VITALS — BP 112/72 | HR 75 | Temp 97.7°F | Resp 18 | Ht 68.0 in | Wt 133.9 lb

## 2024-08-08 DIAGNOSIS — R971 Elevated cancer antigen 125 [CA 125]: Secondary | ICD-10-CM | POA: Diagnosis not present

## 2024-08-08 DIAGNOSIS — Z803 Family history of malignant neoplasm of breast: Secondary | ICD-10-CM | POA: Insufficient documentation

## 2024-08-08 DIAGNOSIS — Z1502 Genetic susceptibility to malignant neoplasm of ovary: Secondary | ICD-10-CM | POA: Diagnosis not present

## 2024-08-08 DIAGNOSIS — Z8041 Family history of malignant neoplasm of ovary: Secondary | ICD-10-CM | POA: Insufficient documentation

## 2024-08-08 DIAGNOSIS — Z1509 Genetic susceptibility to other malignant neoplasm: Secondary | ICD-10-CM | POA: Insufficient documentation

## 2024-08-08 DIAGNOSIS — Z1501 Genetic susceptibility to malignant neoplasm of breast: Secondary | ICD-10-CM | POA: Insufficient documentation

## 2024-08-08 DIAGNOSIS — Z9189 Other specified personal risk factors, not elsewhere classified: Secondary | ICD-10-CM

## 2024-08-08 NOTE — Telephone Encounter (Signed)
 Spoke with patient. Advised Dr.Miller saw that she completed her MRI of the pelvis and lumbar spine. Dr.Miller recommends that she still proceed with the CT abdomen pelvis for evaluation of the abdomen. Patient is agreeable. Advised I will contact [facility] to assist with rescheduling and will return call. Patient is agreeable and is open to any day or time for scheduling.

## 2024-08-08 NOTE — Telephone Encounter (Signed)
 CT abdomen pelvis scheduled for 08/12/24 at 9:40 am. Patient will need to pick up contrast today or Monday 08/11/24. Spoke with patient. Advised of appointment date and time as well as instructions to pick up contrast for their location. Patient is agreeable and verbalizes understanding.

## 2024-08-08 NOTE — Progress Notes (Signed)
 [facility]  [phone] [phone]) [phone]    ID: Dominique Cannon DOB: 12/24/1985  MR#: [id number]  RDW#:[id number]  Patient Care Team: Dominique Cannon Dominique RAMAN, MD as Consulting Physician (Gynecology) Fair, Mitzie SAILOR, MD (Inactive) as Consulting Physician [facility] Medicine)  OTHER MD:  CHIEF COMPLAINT: BRCA 2 positivity  CURRENT TREATMENT: Intensified screening  INTERVAL HISTORY:  Discussed the use of AI scribe software for clinical note transcription with the patient, who gave verbal consent to proceed. History of Present Illness Dominique Cannon is a 38 year old female with a history of back pain and elevated cancer antigen levels for ovarian cancer who presents for follow-up on her back condition and recent imaging results.  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 In April, the pain intensified, rendering her immobile for several days. An MRI in August revealed a protruding disc. She has been undergoing physical therapy for the past two weeks, which has been beneficial. She is not currently taking medication for her back pain as she is not experiencing pain at this time.  In conjunction with her back pain, she had elevated cancer antigen 125 levels, which were noted to be high during her episodes of pain. An MRI of the pelvis was conducted to rule out other causes, and it returned clear.  She has a history of breast cancer surveillance due to her BRCA2 status. Her last mammogram was in June. She has two young children, ages four and six, and limited family support nearby, which impacts her decision-making regarding surgery. No changes in her breast have been noted, and she does not perform regular self-examinations.    HISTORY OF PRESENT ILLNESS: From the original intake note:  Dominique Cannon has a significant family history suggesting a hereditary breast cancer syndrome and her gynecologist Dominique Cannon working  with Dr. Nikki referred her for genetic evaluation. 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 The other 19 genes tested were unremarkable.  Her subsequent history is as detailed below   PAST MEDICAL HISTORY: Past Medical History:  Diagnosis Date   BRCA2 positive    Family history of breast cancer in mother    mother age 77 died of breast cancer   Family history of ovarian cancer     PAST SURGICAL HISTORY: Past Surgical History:  Procedure Laterality Date   DILATION AND EVACUATION N/A 01/16/2017   Procedure: DILATATION AND EVACUATION;  Surgeon: Dominique RAMAN Cleotilde, MD;  Location: [facility];  Service: Gynecology;  Laterality: N/A;   WISDOM TOOTH EXTRACTION      FAMILY HISTORY Family History  Problem Relation Age of Onset   Breast cancer Mother 61   Ovarian cancer Maternal Grandmother        dx in her 31s   Breast cancer Paternal Grandmother    Cancer Maternal Uncle        not sure what type   Other Other        positive genetic testing for the gene   Breast cancer Other        MGF's sister   Breast cancer Other        MGF's mother  The patient's mother died at the age of 53 from breast cancer. The patient's mother had one sister, in good health. The patient's mother's mother has a history of ovarian cancer. One maternal great aunt also had a history of breast  cancer. A second has tested positive for a gene mutation, but we do not have those records. On the paternal side there is also a history of breast cancer, likely postmenopausal   GYNECOLOGIC HISTORY:  No LMP recorded. (Menstrual status: IUD). Menarche age 19, the patient is GX P2. She uses anIUD for contraception.    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  [employer]. Her husband, [name], is also a Information systems manager, and band leader but is currently working for [employer] from home.  Icess gave birth to her son, Veria, on 07/24/2018 and her second son [name] on 02/05/2020.   ADVANCED DIRECTIVES: In the absence of any documentation to the contrary, the patient's spouse is their HCPOA.    HEALTH MAINTENANCE: Social History   Tobacco Use   Smoking status: Never   Smokeless tobacco: Never  Vaping Use   Vaping status: Never Used  Substance Use Topics   Alcohol use: Not Currently    Comment: socially barely    Drug use: No     Colonoscopy: n/a (age)  PAP:  Bone density: n/a (age)  No Known Allergies  Current Outpatient Medications  Medication Sig Dispense Refill   meloxicam  (MOBIC ) 15 MG tablet Take 1 tablet (15 mg total) by mouth daily. 30 tablet 0   PARAGARD  INTRAUTERINE COPPER  IU by Intrauterine route.     No current facility-administered medications for this visit.    OBJECTIVE: white woman who appears well  There were no vitals filed for this visit.  Wt Readings from Last 3 Encounters:  07/08/24 130 lb (59 kg)  06/20/24 133 lb (60.3 kg)  05/21/24 133 lb 12.8 oz (60.7 kg)   There is no height or weight on file to calculate BMI.    ECOG FS:1 - Symptomatic but completely ambulatory  Sclerae unicteric, EOMs intact Bilateral breast exam done.  No obvious palpable masses or regional adenopathy. No lower extremity edema. CTA bilaterally RRR No LE edema.   LAB RESULTS:  CMP     Component Value Date/Time   NA 143 06/20/2024 1423   K 4.1 06/20/2024 1423   CL 101 06/20/2024 1423   CO2 31 06/20/2024 1423   GLUCOSE 104 (H) 06/20/2024 1423   BUN 14 06/20/2024 1423   CREATININE 0.70 06/20/2024 1423   CALCIUM 9.9 06/20/2024 1423   PROT 7.6 06/20/2024 1423   ALBUMIN 4.9 06/20/2024 1423   AST 11 06/20/2024 1423   ALT 10 06/20/2024 1423   ALKPHOS 41 06/20/2024 1423   BILITOT 0.4 06/20/2024 1423   GFRNONAA >60 08/04/2019 1444   GFRAA >60 08/04/2019 1444    INo results  found for: SPEP, UPEP  Lab Results  Component Value Date   WBC 7.9 06/20/2024   NEUTROABS 5.6 06/20/2024   HGB 13.8 06/20/2024   HCT 41.6 06/20/2024   MCV 88.7 06/20/2024   PLT 369.0 06/20/2024      Chemistry      Component Value Date/Time   NA 143 06/20/2024 1423   K 4.1 06/20/2024 1423   CL 101 06/20/2024 1423   CO2 31 06/20/2024 1423   BUN 14 06/20/2024 1423   CREATININE 0.70 06/20/2024 1423      Component Value Date/Time   CALCIUM 9.9 06/20/2024 1423   ALKPHOS 41 06/20/2024 1423   AST 11 06/20/2024 1423   ALT 10 06/20/2024 1423   BILITOT 0.4 06/20/2024  1423       No results found for: LABCA2  No components found for: LABCA125  No results for input(s): INR in the last 168 hours.  Urinalysis No results found for: COLORURINE, APPEARANCEUR, LABSPEC, PHURINE, GLUCOSEU, HGBUR, BILIRUBINUR, KETONESUR, PROTEINUR, UROBILINOGEN, NITRITE, LEUKOCYTESUR   STUDIES: No results found.    ASSESSMENT: 38 y.o. BRCA 2 positive Dominique Cannon, Dominique Cannon woman with brest density category D   (1) patient has decided against bilateral mastectomies and BSO, at least until child-bearing and nursing are complete  (2) patient opted against tamoxifen for risk reduction  (3) breast cancer screening: yearly mammogram and MRI, preferably 6 months apart to start at the end of breast-feeding, April 2022  (a) biannual breas   t exam   (4) ovarian cancer screening: pelvic US  every 6 months with CA 125 with Dr. Miller   Assessment & Plan BRCA2 gene mutation carrier BRCA2 mutation increases risk for breast and ovarian cancer. Asymptomatic with no breast changes. Elevated CA-125 likely due to inflammation, not malignancy. She had an MRI pelvis with no suspicious findings. - Order breast MRI in January. She alternates mammogram and MRI's for BRCA 2 mutation. - continue ovarian cancer screening with Dr Dominique Cannon. - Alternate surveillance with gynecology and oncology  visits. - Encouraged contact with plastic surgeon Dr. Arelia when ready. - Continue regular mammograms and MRIs per protocol.  Total time spent: 30 minutes  *Total Encounter Time as defined by the Centers for Medicare and Medicaid Services includes, in addition to the face-to-face time of a patient visit (documented in the note above) non-face-to-face time: obtaining and reviewing outside history, ordering and reviewing medications, tests or procedures, care coordination (communications with other health care professionals or caregivers) and documentation in the medical record.

## 2024-08-11 ENCOUNTER — Ambulatory Visit: Admitting: Physical Therapy

## 2024-08-12 ENCOUNTER — Inpatient Hospital Stay
Admission: RE | Admit: 2024-08-12 | Discharge: 2024-08-12 | Disposition: A | Source: Ambulatory Visit | Attending: Obstetrics & Gynecology | Admitting: Obstetrics & Gynecology

## 2024-08-12 DIAGNOSIS — R971 Elevated cancer antigen 125 [CA 125]: Secondary | ICD-10-CM

## 2024-08-12 MED ORDER — IOPAMIDOL (ISOVUE-300) INJECTION 61%
100.0000 mL | Freq: Once | INTRAVENOUS | Status: AC | PRN
  Administered 2024-08-12: 100 mL via INTRAVENOUS

## 2024-08-13 ENCOUNTER — Ambulatory Visit (HOSPITAL_BASED_OUTPATIENT_CLINIC_OR_DEPARTMENT_OTHER): Payer: Self-pay | Admitting: Obstetrics & Gynecology

## 2024-08-14 ENCOUNTER — Ambulatory Visit: Attending: Family Medicine | Admitting: Physical Therapy

## 2024-08-14 DIAGNOSIS — M5442 Lumbago with sciatica, left side: Secondary | ICD-10-CM | POA: Diagnosis present

## 2024-08-14 DIAGNOSIS — G8929 Other chronic pain: Secondary | ICD-10-CM | POA: Diagnosis present

## 2024-08-14 DIAGNOSIS — M5459 Other low back pain: Secondary | ICD-10-CM | POA: Diagnosis present

## 2024-08-14 NOTE — Therapy (Signed)
 OUTPATIENT PHYSICAL THERAPY THORACOLUMBAR TREATMENT   Patient Name: Dominique Cannon MRN: [id number] DOB:1986/09/24, 38 y.o., female Today's Date: 08/14/2024  END OF SESSION:  PT End of Session - 08/14/24 1418     Visit Number 4    Number of Visits 24    Date for Recertification  10/22/24    Authorization Type [employer] Medicare  2025    Authorization - Visit Number 4    Authorization - Number of Visits 24    Progress Note Due on Visit 10    PT Start Time 1345    PT Stop Time 1430    PT Time Calculation (min) 45 min    Activity Tolerance Patient tolerated treatment well    Behavior During Therapy WFL for tasks assessed/performed             Past Medical History:  Diagnosis Date   BRCA2 positive    Family history of breast cancer in mother    mother age 59 died of breast cancer   Family history of ovarian cancer    Past Surgical History:  Procedure Laterality Date   DILATION AND EVACUATION N/A 01/16/2017   Procedure: DILATATION AND EVACUATION;  Surgeon: Ronal GORMAN Pinal, MD;  Location: [facility];  Service: Gynecology;  Laterality: N/A;   WISDOM TOOTH EXTRACTION     Patient Active Problem List   Diagnosis Date Noted   Acute lumbar back pain 06/20/2024   IUD (intrauterine device) in place 11/03/2023   Atypical glandular cells of undetermined significance (AGUS) on cervical Pap smear 11/03/2023   Breast cancer screening, high risk patient 03/03/2020   At high risk for breast cancer 03/03/2020   High risk of ovarian cancer 03/03/2020   History of shoulder dystocia in prior pregnancy, currently pregnant 12/16/2019   History of recurrent miscarriages 12/26/2017   BRCA2 positive 09/27/2015   Family history of breast cancer     PCP: Dr. Arthea Sharps    REFERRING PROVIDER: Dr. Arthea Sharps    REFERRING DIAG: [phone] (ICD-10-CM) - Acute bilateral low back pain with left-sided sciatica   Rationale for Evaluation and Treatment: Rehabilitation  THERAPY DIAG:  No diagnosis  found.  ONSET DATE: December 2024      SUBJECTIVE:                                                                                                                                                                                           SUBJECTIVE STATEMENT: Pt reports that she has been very busy and unfortunately she was not able to complete her home exercise plan.   PERTINENT HISTORY:  Pt reports that she started  experiencing pain after lifting laundry basket last year, but it did go away for a while. When she is experiencing the severe pain, she  She now is experiencing increased low back pain again which started this past July. She saw a physician, who ordered MRI, which showed a disc herniation. She has had a marked improved in her symptoms over the past couple of weeks, and Dr. Claudene has suggested that she do physical therapy instead of further medical treatments. 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 She has avoided returning to yoga and bending down to lift objects out of fear of re-aggravating her back.    PAIN:  Are you having pain? Yes: NPRS scale: 0/10 currently, 3/10 (after going on walk)  Pain location: Lumbar paraspinals   Pain description: Dull   Aggravating factors: Taking longer walks    Relieving factors: Rest    PRECAUTIONS: None  RED FLAGS: None   WEIGHT BEARING RESTRICTIONS: No  FALLS:  Has patient fallen in last 6 months? 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  VITALS 108/82 HR 745   DIAGNOSTIC FINDINGS:  MR LUMBAR SPINE WITHOUT IV CONTRAST   COMPARISON: None available   CLINICAL HISTORY: Low back pain and pelvic pain.   TECHNIQUE: SAG T2, SAG T1, SAG STIR, AX T2, AX T1 without IV contrast.   FINDINGS: There is normal alignment of the lumbar spine. There is a mild broad-based bulge at L5-S1. There is no vertebral body height loss, subluxation or marrow replacing process. The sacrum and SI joints are unremarkable so far as visualized. Conus and cauda equina are unremarkable.   T12-L1: There is no focal disc protrusion, foraminal or spinal stenosis.   L1-2: There is no focal disc protrusion, foraminal or spinal stenosis.   L2-3: There is no focal disc protrusion, foraminal or spinal stenosis.   L3-4: There is no focal disc protrusion, foraminal or spinal stenosis.   L4-5: There is no focal disc protrusion, foraminal or spinal stenosis.   L5-S1: There is a moderate central left paracentral protrusion at L5-S1 crowding the descending nerve roots in the left lateral recess. No definitive impingement is identified. Small annular tear is present.   The retroperitoneal structures demonstrate no significant abnormality.   IMPRESSION: Left L5-S1 paracentral protrusion/extrusion with effacement of ventral thecal sac. There is mild abutment of the descending nerve roots in the descending left lateral recess without impingement. This is best seen on sagittal image 9 and axial image 38. No foraminal or spinal stenosis is present.   Electronically signed by: Norleen Satchel MD 07/05/2024 12:38 PM EDT RP Workstation: [id number]    PATIENT SURVEYS:  Modified Oswestry:  MODIFIED OSWESTRY DISABILITY SCALE  Date: 07/30/24 Score  Pain intensity 0 = I can tolerate the pain I have without having to  use pain medication.  2. Personal care (washing, dressing, etc.) 0 =  I can take care of myself normally without causing increased pain.  3. Lifting 1 = I can lift heavy weights, but it causes increased pain.  4. Walking 1 = Pain prevents me from walking more than 1 mile.  5. Sitting 1 =  I can only sit in my favorite chair as long as I like.  6. Standing 1 =  I can stand as long as I want but, it increases my pain.  7. Sleeping 0 = Pain does not prevent me from sleeping well.  8. Social Life 1 =  My social life is normal, but it increases my level of pain.  9. Traveling 0 =  I can travel anywhere without increased pain.  10. Employment/ Homemaking 1 = My normal homemaking/job activities increase my pain, but I can still perform all that is required of me  Total 6/50 (12%)    Interpretation of scores: Score Category Description  0-20% Minimal Disability The patient can cope with most living activities. Usually no treatment is indicated apart from advice on lifting, sitting and exercise  21-40% Moderate Disability The patient experiences more pain and difficulty with sitting, lifting and standing. Travel and social life are more difficult and they may be disabled from work. Personal care, sexual activity and sleeping are not grossly affected, and the patient can usually be managed by conservative means  41-60% Severe Disability Pain remains the main problem in this group, but activities of daily living are affected. These patients require a detailed investigation  61-80% Crippled Back pain impinges on all aspects of the patient's life. Positive intervention is required  81-100% Bed-bound  These patients are either bed-bound or exaggerating their symptoms  Bluford FORBES Zoe DELENA Karon DELENA, et al. Surgery versus conservative management of stable thoracolumbar fracture: the PRESTO feasibility RCT. [place] ([place]): [employer]; 2021 Nov. [facility] Technology Assessment, No. 25.62.) Appendix 3,  Oswestry Disability Index category descriptors. Available from: FindJewelers.cz  Minimally Clinically Important Difference (MCID) = 12.8%  COGNITION: Overall cognitive status: Within functional limits for tasks assessed     SENSATION: WFL  MUSCLE LENGTH: Hamstrings: Right 70 deg; Left 70 deg Thomas test: Negative Bilaterally     POSTURE: No Significant postural limitations  PALPATION: Increased tension in right lumbar paraspinal    LUMBAR ROM:   AROM eval  Flexion 100%  Extension 100%  Right lateral flexion 100%  Left lateral flexion 100%  Right rotation 100%  Left rotation 100%   (Blank rows = not tested)  LOWER EXTREMITY ROM:     Active  Right eval Left eval  Hip flexion    Hip extension    Hip abduction    Hip adduction    Hip internal rotation    Hip external rotation    Knee flexion    Knee extension    Ankle dorsiflexion    Ankle plantarflexion    Ankle inversion    Ankle eversion     (Blank rows = not tested)  LOWER EXTREMITY MMT:    MMT Right eval Left eval  Hip flexion 4+ 4+  Hip extension 4 4  Hip abduction 4 4  Hip adduction 4 4-  Hip internal rotation 4 4    Hip external rotation 4 4   Knee flexion 4+ 4+  Knee extension 4+ 4+  Ankle dorsiflexion 4+ 4+  Ankle plantarflexion    Ankle inversion    Ankle eversion     (  Blank rows = not tested)  UPPER EXTREMITY MMT                           MMT Right 08/14/24 Left 08/14/24  Mid Trap  4 4  Lower Trap  4- 4-  Rhomboids    4 4  Shoulder Ext   4+ 4                                   (Blank rows = not tested)  LUMBAR SPECIAL TESTS:  FABER test: Negative, Trendelenburg sign: Not tested, and FADIR Negative   FUNCTIONAL TESTS:  3 mile distance walk: NT    GAIT: Distance walked: 50 ft  Assistive device utilized: None Level of assistance: Complete Independence Comments: No abnormalities noted  TREATMENT DATE:   08/14/24:  [facility]   [place]  Press on [employer] with #10 DB  2 x 10   -Pt reports slight fatigue in right low back    Suitcase Carry 10 meters with #10 KB  x 4   Periscapular MMT (See Above) Isometric Hip Hinge 1 x 10  -mod VC to increase knee flexion and to extend thoracic spine to maintain flat back   Bent Over Y's 3 x 10   -min VC to increased lumbar and thoracic extension  MANUAL   Trigger point release and massage of upper trap                                                                                                                PATIENT EDUCATION:  Education details: Form and technique for correct performance of exercise and explanation about underlying deficits.  Person educated: Patient Education method: Explanation, Demonstration, Verbal cues, and Handouts Education comprehension: verbalized understanding, returned demonstration, and verbal cues required  HOME EXERCISE PROGRAM: Access Code: [email or web address] URL: [email or web address] Date: 08/14/2024 Prepared by: Toribio Servant  Exercises - Seated Cervical Sidebending Stretch  - 1 x daily - 7 x weekly - 3 reps - 30-60 sec  hold - Seated Cervical Sidebending Stretch (Mirrored)  - 1 x daily - 7 x weekly - 3 reps - 30-60 sec  hold - Standing Forward Trunk Flexion  - 1 x daily - 7 x weekly - 3 reps - 30 sec   hold - Sidelying Hip Adduction (Mirrored)  - 3-4 x weekly - 3 sets - 10 reps - Standing Hip Hinge with Dowel  - 5-7 x weekly - 3 sets - 20 reps - Full Plank with Hip Extension - Knee Straight  - 3-4 x weekly - 3 sets - 10 reps - Deadlift with #20 Kettlebell    - 3-4 x weekly - 3 sets - 10 reps - Standing Forward-Bent Shoulder Y With Dumbbells  - 1 x daily - 3-4 x weekly - 3 sets - 10 reps   ASSESSMENT:  CLINICAL IMPRESSION: Pt able to perform all exercises without an  increase in her right sided low back pain. PT with focus on abdominal, periscapular, hip, lumbar and thoracic paraspinal strengthening and muscle activation  and coordination in order to maintain neutral spine while performing bending tasks to perform child care and home cleaning tasks. She was able to perform successfully after verbal cues to increase knee flexion and to maintain lumbar and thoracic extension when performing exercise. She has swayback posture with slight kyphosis In thoracic and cervical spine with increased upper trap tension. PT utilizing manual therapy to increase upper trap extensibility, so that pt experience less pain and discomfort and so that she can improve posture. She will continue to benefit from skilled PT to address these aforementioned deficits to return to walking longer distances and to bending down to perform home duty and childcare tasks without being limited by pain and discomfort.        OBJECTIVE IMPAIRMENTS: decreased strength, impaired flexibility, and pain.   ACTIVITY LIMITATIONS: carrying, lifting, bending, and locomotion level  PARTICIPATION LIMITATIONS: cleaning and community activity  PERSONAL FACTORS: Age, Fitness, Time since onset of injury/illness/exacerbation are also affecting patient's functional outcome.   REHAB POTENTIAL: Good  CLINICAL DECISION MAKING: Stable/uncomplicated  EVALUATION COMPLEXITY: Low   GOALS: Goals reviewed with patient? No  SHORT TERM GOALS: Target date: 08/13/2024  Patient will demonstrate undestanding of home exercise plan by performing exercises correctly with evidence of good carry over with min to no verbal or tactile cues .   Baseline: NT  08/05/24: Performing exercises independently  Goal status: ACHIEVED    2.  Patient will understand the proper biomechanics when bending forward to lift objects like her son with use of hip hinge instead of rounding her back to avoid re-aggravating disc herniation.     Baseline: NT 08/05/24 Instructed on how to perform hip hinge  Goal status: ACHIEVED     LONG TERM GOALS: Target date: 10/22/2024  Patient will improve modified  Oswestry Disability Index (MODI) score by decreasing initial score by >=13% as evidence of the minimal statistically significant change for improvement with low back pain disability and improvement in low back function (Copay et al, 2008) Baseline: 6/50 (12%) Goal status: ONGOING    2.  Patient will improve hip strength by 1/3 grade MMT (ie 4- to 4) for improved lumbar spine stability and to decrease incidence of pain exacerbation from disc herniation and to resume walking and lifting to care for her children and maintain her household without being limited by pain and discomfort.   Baseline: Hip Ext R/L 4/4, Hip Abd R/L 4/4 , Hip Add R/L 4/4-  Goal status: ONGOING    3.  Patient will improve lumbar function and decrease lumbar pain with activity as evidenced by walking >=3 miles with exceeding low back pain that is >=3/10.  Baseline: 3/10   Goal status: ONGOING    PLAN:  PT FREQUENCY: 1-2x/week  PT DURATION: 12 weeks  PLANNED INTERVENTIONS: 97164- PT Re-evaluation, 97750- Physical Performance Testing, 97110-Therapeutic exercises, 97530- Therapeutic activity, 97112- Neuromuscular re-education, 97535- Self Care, [id number]- Manual therapy, [phone]- Gait training, [phone]- Aquatic Therapy, [phone]- Electrical stimulation (unattended), [phone]- Electrical stimulation (manual), [id number]- Traction (mechanical), 20560 (1-2 muscles), 20561 (3+ muscles)- Dry Needling, Patient/Family education, Balance training, Taping, Joint mobilization, Joint manipulation, Spinal manipulation, Spinal mobilization, Vestibular training, DME instructions, Cryotherapy, and Moist heat.  PLAN FOR NEXT SESSION:  Reassess long term goals. Focus on manual therapy for upper traps and periscapular muscles.    Toribio Servant PT, DPT  [facility] [address], [place], [address] Phone: [phone]   Fax:  [phone]

## 2024-08-19 ENCOUNTER — Encounter: Admitting: Physical Therapy

## 2024-08-19 ENCOUNTER — Telehealth: Payer: Self-pay | Admitting: Hematology and Oncology

## 2024-08-19 NOTE — Telephone Encounter (Signed)
 left vm for patient about scheduled appt. Encouraged to call back if this dos not work for her schedule

## 2024-08-21 ENCOUNTER — Ambulatory Visit: Admitting: Physical Therapy

## 2024-08-21 DIAGNOSIS — G8929 Other chronic pain: Secondary | ICD-10-CM

## 2024-08-21 DIAGNOSIS — M5459 Other low back pain: Secondary | ICD-10-CM | POA: Diagnosis not present

## 2024-08-21 NOTE — Therapy (Signed)
 OUTPATIENT PHYSICAL THERAPY THORACOLUMBAR TREATMENT   Patient Name: Dominique Cannon MRN: [id number] DOB:01-26-1986, 38 y.o., female Today's Date: 08/21/2024  END OF SESSION:  PT End of Session - 08/21/24 1645     Visit Number 5    Number of Visits 24    Date for Recertification  10/22/24    Authorization Type [employer] Medicare  2025    Authorization - Visit Number 5    Authorization - Number of Visits 24    Progress Note Due on Visit 10    PT Start Time 1345    PT Stop Time 1430    PT Time Calculation (min) 45 min    Activity Tolerance Patient tolerated treatment well    Behavior During Therapy WFL for tasks assessed/performed              Past Medical History:  Diagnosis Date   BRCA2 positive    Family history of breast cancer in mother    mother age 9 died of breast cancer   Family history of ovarian cancer    Past Surgical History:  Procedure Laterality Date   DILATION AND EVACUATION N/A 01/16/2017   Procedure: DILATATION AND EVACUATION;  Surgeon: Ronal GORMAN Pinal, MD;  Location: [facility];  Service: Gynecology;  Laterality: N/A;   WISDOM TOOTH EXTRACTION     Patient Active Problem List   Diagnosis Date Noted   Acute lumbar back pain 06/20/2024   IUD (intrauterine device) in place 11/03/2023   Atypical glandular cells of undetermined significance (AGUS) on cervical Pap smear 11/03/2023   Breast cancer screening, high risk patient 03/03/2020   At high risk for breast cancer 03/03/2020   High risk of ovarian cancer 03/03/2020   History of shoulder dystocia in prior pregnancy, currently pregnant 12/16/2019   History of recurrent miscarriages 12/26/2017   BRCA2 positive 09/27/2015   Family history of breast cancer     PCP: Dr. Arthea Sharps    REFERRING PROVIDER: Dr. Arthea Sharps    REFERRING DIAG: [phone] (ICD-10-CM) - Acute bilateral low back pain with left-sided sciatica   Rationale for Evaluation and Treatment: Rehabilitation  THERAPY DIAG:  Other low back  pain  Chronic bilateral low back pain with left-sided sciatica  ONSET DATE: December 2024      SUBJECTIVE:                                                                                                                                                                                           SUBJECTIVE STATEMENT: Pt reports experiencing some low back pain after standing at a downward slopped arena. Otherwise, she has been doing  great.   PERTINENT HISTORY:  Pt reports that she started experiencing pain after lifting laundry basket last year, but it did go away for a while. When she is experiencing the severe pain, she  She now is experiencing increased low back pain again which started this past July. She saw a physician, who ordered MRI, which showed a disc herniation. She has had a marked improved in her symptoms over the past couple of weeks, and Dr. Claudene has suggested that she do physical therapy instead of further medical treatments. 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 She has avoided returning to yoga and bending down to lift objects out of fear of re-aggravating her back.    PAIN:  Are you having pain? Yes: NPRS scale: 0/10 currently, 3/10 (after going on walk)  Pain location: Lumbar paraspinals   Pain description: Dull   Aggravating factors: Taking longer walks    Relieving factors: Rest    PRECAUTIONS: None  RED FLAGS: None   WEIGHT BEARING RESTRICTIONS: No  FALLS:  Has patient fallen in last 6 months? 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  VITALS 108/82 HR 745   DIAGNOSTIC FINDINGS:  MR LUMBAR SPINE WITHOUT IV CONTRAST   COMPARISON: None available   CLINICAL HISTORY: Low back pain and pelvic pain.   TECHNIQUE: SAG T2, SAG T1, SAG STIR, AX T2, AX T1 without IV contrast.   FINDINGS: There is normal alignment of the lumbar spine. There is a mild broad-based bulge at L5-S1. There is no vertebral body height loss, subluxation or marrow replacing process. The sacrum and SI joints are unremarkable so far as visualized. Conus and cauda equina are unremarkable.   T12-L1: There is no focal disc protrusion, foraminal or spinal stenosis.   L1-2: There is no focal disc protrusion, foraminal or spinal stenosis.   L2-3: There is no focal disc protrusion, foraminal or spinal stenosis.   L3-4: There is no focal disc protrusion, foraminal or spinal stenosis.   L4-5: There is no focal disc protrusion, foraminal or spinal stenosis.   L5-S1: There is a moderate central left paracentral protrusion at L5-S1 crowding the descending nerve roots in the left lateral recess. No definitive impingement is identified. Small annular tear is present.   The retroperitoneal structures demonstrate no significant abnormality.   IMPRESSION: Left L5-S1 paracentral protrusion/extrusion with effacement of ventral thecal sac. There is mild abutment of the descending nerve roots in the descending left lateral recess without impingement. This is best seen on sagittal image 9 and axial image 38. No foraminal or spinal stenosis is present.   Electronically signed by: Norleen Satchel MD 07/05/2024 12:38 PM EDT RP Workstation: [id number]    PATIENT SURVEYS:  Modified Oswestry:  MODIFIED OSWESTRY DISABILITY SCALE  Date: 07/30/24  Score  Pain intensity 0 = I can  tolerate the pain I have without having to use pain medication.  2. Personal care (washing, dressing, etc.) 0 =  I can take care of myself normally without causing increased pain.  3. Lifting 1 = I can lift heavy weights, but it causes increased pain.  4. Walking 1 = Pain prevents me from walking more than 1 mile.  5. Sitting 1 =  I can only sit in my favorite chair as long as I like.  6. Standing 1 =  I can stand as long as I want but, it increases my pain.  7. Sleeping 0 = Pain does not prevent me from sleeping well.  8. Social Life 1 =  My social life is normal, but it increases my level of pain.  9. Traveling 0 =  I can travel anywhere without increased pain.  10. Employment/ Homemaking 1 = My normal homemaking/job activities increase my pain, but I can still perform all that is required of me  Total 6/50 (12%)    Interpretation of scores: Score Category Description  0-20% Minimal Disability The patient can cope with most living activities. Usually no treatment is indicated apart from advice on lifting, sitting and exercise  21-40% Moderate Disability The patient experiences more pain and difficulty with sitting, lifting and standing. Travel and social life are more difficult and they may be disabled from work. Personal care, sexual activity and sleeping are not grossly affected, and the patient can usually be managed by conservative means  41-60% Severe Disability Pain remains the main problem in this group, but activities of daily living are affected. These patients require a detailed investigation  61-80% Crippled Back pain impinges on all aspects of the patient's life. Positive intervention is required  81-100% Bed-bound  These patients are either bed-bound or exaggerating their symptoms  Bluford FORBES Zoe DELENA Karon DELENA, et al. Surgery versus conservative management of stable thoracolumbar fracture: the PRESTO feasibility RCT. [place] ([place]): [employer]; 2021 Nov. [facility] Technology Assessment, No. 25.62.) Appendix 3, Oswestry Disability Index category descriptors. Available from: FindJewelers.cz  Minimally Clinically Important Difference (MCID) = 12.8%  COGNITION: Overall cognitive status: Within functional limits for tasks assessed     SENSATION: WFL  MUSCLE LENGTH: Hamstrings: Right 70 deg; Left 70 deg Thomas test: Negative Bilaterally     POSTURE: No Significant postural limitations  PALPATION: Increased tension in right lumbar paraspinal    LUMBAR ROM:   AROM eval  Flexion 100%  Extension 100%  Right lateral flexion 100%  Left lateral flexion 100%  Right rotation 100%  Left rotation 100%   (Blank rows = not tested)  LOWER EXTREMITY ROM:     Active  Right eval Left eval  Hip flexion    Hip extension    Hip abduction    Hip adduction    Hip internal rotation    Hip external rotation    Knee flexion    Knee extension    Ankle dorsiflexion    Ankle plantarflexion    Ankle inversion    Ankle eversion     (Blank rows = not tested)  LOWER EXTREMITY MMT:    MMT Right eval Left eval  Hip flexion 4+ 4+  Hip extension 4 4  Hip abduction 4 4  Hip adduction 4 4-  Hip internal rotation 4 4    Hip external rotation 4 4   Knee flexion 4+ 4+  Knee extension 4+ 4+  Ankle dorsiflexion 4+ 4+  Ankle plantarflexion  Ankle inversion    Ankle eversion     (Blank rows = not tested)  UPPER EXTREMITY MMT                           MMT Right 08/14/24 Left 08/14/24  Mid Trap  4 4  Lower Trap  4- 4-  Rhomboids    4 4  Shoulder Ext   4+ 4                                   (Blank rows = not tested)  LUMBAR SPECIAL TESTS:  FABER test: Negative, Trendelenburg sign: Not tested, and FADIR Negative   FUNCTIONAL TESTS:  3 mile distance walk: NT    GAIT: Distance walked: 50 ft  Assistive device utilized: None Level of assistance: Complete Independence Comments: No  abnormalities noted  TREATMENT DATE:   08/21/24   THERAC   TM with 1.9 mph for 5 min   TM with 2.0 mph for  5 min at 2.0 incline   -1 mile walk    ODI:  4% (2/50)  THEREX   Bent over rows jug of water 2 x 10    Bent over rows #15 DB  2 x 10    -Pt reports increased right sided lumbar paraspinal pain.  Bent over rows with half quadruped on mat with #15 DB   1 x 10    MANUAL  Education on use of theracane for trigger point release in periscapular musculature                                                                                                               PATIENT EDUCATION:  Education details: Form and technique for correct performance of exercise and explanation about underlying deficits.  Person educated: Patient Education method: Explanation, Demonstration, Verbal cues, and Handouts Education comprehension: verbalized understanding, returned demonstration, and verbal cues required  HOME EXERCISE PROGRAM: Access Code: [email or web address] URL: [email or web address] Date: 08/21/2024 Prepared by: Toribio Servant  Exercises - Seated Cervical Sidebending Stretch  - 1 x daily - 7 x weekly - 3 reps - 30-60 sec  hold - Seated Cervical Sidebending Stretch (Mirrored)  - 1 x daily - 7 x weekly - 3 reps - 30-60 sec  hold - Standing Forward Trunk Flexion  - 1 x daily - 7 x weekly - 3 reps - 30 sec   hold - Sidelying Hip Adduction (Mirrored)  - 3-4 x weekly - 3 sets - 10 reps - Full Plank with Hip Extension - Knee Straight  - 3-4 x weekly - 3 sets - 10 reps - Deadlift with #20 Kettlebell    - 3-4 x weekly - 3 sets - 10 reps - Standing Forward-Bent Shoulder Y With Dumbbells  - 1 x daily - 3-4 x weekly - 3 sets - 10 reps - Bent Over Single Arm Shoulder Row with Dumbbell  - 3-4  x weekly - 3 sets - 10 reps - Bent Over Single Arm Shoulder Row with Dumbbell (Mirrored)  - 3-4 x weekly - 3 sets - 10 reps   ASSESSMENT:  CLINICAL IMPRESSION: Pt continues to progress towards  goals with improvement in perception of low back function and decrease in pain. PT focused on strengthening exercises for periscapular muscles to improve strength of thoracic posterior chain to maintain a neutral spine during hip hinge. Modified bent over rows to include bringing weight onto knee to avoid forward flexion with weight, which tends to worsen disc herniation symptoms. She is nearing end of plan of care with only remaining goal to assess hip strength and review yoga positions to determine what is safe for her to resume. She will continue to benefit from skilled PT to address these aforementioned deficits to return to walking longer distances and to bending down to perform home duty and childcare tasks without being limited by pain and discomfort.         OBJECTIVE IMPAIRMENTS: decreased strength, impaired flexibility, and pain.   ACTIVITY LIMITATIONS: carrying, lifting, bending, and locomotion level  PARTICIPATION LIMITATIONS: cleaning and community activity  PERSONAL FACTORS: Age, Fitness, Time since onset of injury/illness/exacerbation are also affecting patient's functional outcome.   REHAB POTENTIAL: Good  CLINICAL DECISION MAKING: Stable/uncomplicated  EVALUATION COMPLEXITY: Low   GOALS: Goals reviewed with patient? No  SHORT TERM GOALS: Target date: 08/13/2024  Patient will demonstrate undestanding of home exercise plan by performing exercises correctly with evidence of good carry over with min to no verbal or tactile cues .   Baseline: NT  08/05/24: Performing exercises independently  Goal status: ACHIEVED    2.  Patient will understand the proper biomechanics when bending forward to lift objects like her son with use of hip hinge instead of rounding her back to avoid re-aggravating disc herniation.     Baseline: NT 08/05/24 Instructed on how to perform hip hinge  Goal status: ACHIEVED     LONG TERM GOALS: Target date: 10/22/2024  Patient will improve modified  Oswestry Disability Index (MODI) score by decreasing initial score by >=13% as evidence of the minimal statistically significant change for improvement with low back pain disability and improvement in low back function (Copay et al, 2008) Baseline: 6/50 (12%)  08/21/24: 4%  Goal status: ACHIEVED   2.  Patient will improve hip strength by 1/3 grade MMT (ie 4- to 4) for improved lumbar spine stability and to decrease incidence of pain exacerbation from disc herniation and to resume walking and lifting to care for her children and maintain her household without being limited by pain and discomfort.   Baseline: Hip Ext R/L 4/4, Hip Abd R/L 4/4 , Hip Add R/L 4/4-  Goal status: ONGOING    3.  Patient will improve lumbar function and decrease lumbar pain with activity as evidenced by walking >=3 miles with exceeding low back pain that is >=3/10.  Baseline: 3/10   08/21/24: 0/10 NRPS after walking 1 mile at 2.0 mph   Goal status: ACHIEVED     PLAN:  PT FREQUENCY: 1-2x/week  PT DURATION: 12 weeks  PLANNED INTERVENTIONS: 97164- PT Re-evaluation, 97750- Physical Performance Testing, 97110-Therapeutic exercises, 97530- Therapeutic activity, 97112- Neuromuscular re-education, 97535- Self Care, 02859- Manual therapy, [phone]- Gait training, [phone]- Aquatic Therapy, [phone]- Electrical stimulation (unattended), [phone]- Electrical stimulation (manual), [id number]- Traction (mechanical), 20560 (1-2 muscles), 20561 (3+ muscles)- Dry Needling, Patient/Family education, Balance training, Taping, Joint mobilization, Joint manipulation, Spinal manipulation, Spinal mobilization,  Vestibular training, DME instructions, Cryotherapy, and Moist heat.  PLAN FOR NEXT SESSION:  Yoga possess.  Reassess long term goals: hip strength.  Focus on manual therapy for upper traps and periscapular muscles.    Toribio Servant PT, DPT  [facility] [address] [place], [address] Phone:  [phone]   Fax:  [phone]

## 2024-08-26 ENCOUNTER — Encounter: Admitting: Physical Therapy

## 2024-08-28 ENCOUNTER — Ambulatory Visit: Admitting: Physical Therapy

## 2024-08-28 DIAGNOSIS — G8929 Other chronic pain: Secondary | ICD-10-CM

## 2024-08-28 DIAGNOSIS — M5459 Other low back pain: Secondary | ICD-10-CM | POA: Diagnosis not present

## 2024-08-28 NOTE — Therapy (Signed)
 OUTPATIENT PHYSICAL THERAPY THORACOLUMBAR TREATMENT   Patient Name: Dominique Cannon MRN: [id number] DOB:1986-02-14, 38 y.o., female Today's Date: 08/28/2024  END OF SESSION:  PT End of Session - 08/28/24 1619     Visit Number 6    Number of Visits 24    Date for Recertification  10/22/24    Authorization Type [employer] Medicare  2025    Authorization - Number of Visits 24    Progress Note Due on Visit 10    PT Start Time 1430    PT Stop Time 1515    PT Time Calculation (min) 45 min    Activity Tolerance Patient tolerated treatment well    Behavior During Therapy WFL for tasks assessed/performed               Past Medical History:  Diagnosis Date   BRCA2 positive    Family history of breast cancer in mother    mother age 63 died of breast cancer   Family history of ovarian cancer    Past Surgical History:  Procedure Laterality Date   DILATION AND EVACUATION N/A 01/16/2017   Procedure: DILATATION AND EVACUATION;  Surgeon: Ronal GORMAN Pinal, MD;  Location: [facility];  Service: Gynecology;  Laterality: N/A;   WISDOM TOOTH EXTRACTION     Patient Active Problem List   Diagnosis Date Noted   Acute lumbar back pain 06/20/2024   IUD (intrauterine device) in place 11/03/2023   Atypical glandular cells of undetermined significance (AGUS) on cervical Pap smear 11/03/2023   Breast cancer screening, high risk patient 03/03/2020   At high risk for breast cancer 03/03/2020   High risk of ovarian cancer 03/03/2020   History of shoulder dystocia in prior pregnancy, currently pregnant 12/16/2019   History of recurrent miscarriages 12/26/2017   BRCA2 positive 09/27/2015   Family history of breast cancer     PCP: Dr. Arthea Sharps    REFERRING PROVIDER: Dr. Arthea Sharps    REFERRING DIAG: [phone] (ICD-10-CM) - Acute bilateral low back pain with left-sided sciatica   Rationale for Evaluation and Treatment: Rehabilitation  THERAPY DIAG:  Other low back pain  Chronic bilateral low back  pain with left-sided sciatica  ONSET DATE: December 2024      SUBJECTIVE:                                                                                                                                                                                           SUBJECTIVE STATEMENT: Pt reports experiencing some low back pain after standing at a downward slopped arena. Otherwise, she has been doing great.   PERTINENT HISTORY:  Pt  reports that she started experiencing pain after lifting laundry basket last year, but it did go away for a while. When she is experiencing the severe pain, she  She now is experiencing increased low back pain again which started this past July. She saw a physician, who ordered MRI, which showed a disc herniation. She has had a marked improved in her symptoms over the past couple of weeks, and Dr. Claudene has suggested that she do physical therapy instead of further medical treatments. 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 She has avoided returning to yoga and bending down to lift objects out of fear of re-aggravating her back.    PAIN:  Are you having pain? Yes: NPRS scale: 0/10 currently, 3/10 (after going on walk)  Pain location: Lumbar paraspinals   Pain description: Dull   Aggravating factors: Taking longer walks    Relieving factors: Rest    PRECAUTIONS: None  RED FLAGS: None   WEIGHT BEARING RESTRICTIONS: No  FALLS:  Has patient fallen in last 6 months? 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  VITALS 108/82 HR 745   DIAGNOSTIC FINDINGS:  MR LUMBAR SPINE WITHOUT IV CONTRAST   COMPARISON: None available   CLINICAL HISTORY: Low back pain and pelvic pain.   TECHNIQUE: SAG T2, SAG T1, SAG STIR, AX T2, AX T1 without IV contrast.   FINDINGS: There is normal alignment of the lumbar spine. There is a mild broad-based bulge at L5-S1. There is no vertebral body height loss, subluxation or marrow replacing process. The sacrum and SI joints are unremarkable so far as visualized. Conus and cauda equina are unremarkable.   T12-L1: There is no focal disc protrusion, foraminal or spinal stenosis.   L1-2: There is no focal disc protrusion, foraminal or spinal stenosis.   L2-3: There is no focal disc protrusion, foraminal or spinal stenosis.   L3-4: There is no focal disc protrusion, foraminal or spinal stenosis.   L4-5: There is no focal disc protrusion, foraminal or spinal stenosis.   L5-S1: There is a moderate central left paracentral protrusion at L5-S1 crowding the descending nerve roots in the left lateral recess. No definitive impingement is identified. Small annular tear is present.   The retroperitoneal structures demonstrate no significant abnormality.   IMPRESSION: Left L5-S1 paracentral protrusion/extrusion with effacement of ventral thecal sac. There is mild abutment of the descending nerve roots in the descending left lateral recess without impingement. This is best seen on sagittal image 9 and axial image 38. No foraminal or spinal stenosis is present.   Electronically signed by: Norleen Satchel MD 07/05/2024 12:38 PM EDT RP Workstation: [id number]    PATIENT SURVEYS:  Modified Oswestry:  MODIFIED OSWESTRY DISABILITY SCALE  Date: 07/30/24 Score  Pain intensity 0 = I can  tolerate the pain I have without having  to use pain medication.  2. Personal care (washing, dressing, etc.) 0 =  I can take care of myself normally without causing increased pain.  3. Lifting 1 = I can lift heavy weights, but it causes increased pain.  4. Walking 1 = Pain prevents me from walking more than 1 mile.  5. Sitting 1 =  I can only sit in my favorite chair as long as I like.  6. Standing 1 =  I can stand as long as I want but, it increases my pain.  7. Sleeping 0 = Pain does not prevent me from sleeping well.  8. Social Life 1 =  My social life is normal, but it increases my level of pain.  9. Traveling 0 =  I can travel anywhere without increased pain.  10. Employment/ Homemaking 1 = My normal homemaking/job activities increase my pain, but I can still perform all that is required of me  Total 6/50 (12%)    Interpretation of scores: Score Category Description  0-20% Minimal Disability The patient can cope with most living activities. Usually no treatment is indicated apart from advice on lifting, sitting and exercise  21-40% Moderate Disability The patient experiences more pain and difficulty with sitting, lifting and standing. Travel and social life are more difficult and they may be disabled from work. Personal care, sexual activity and sleeping are not grossly affected, and the patient can usually be managed by conservative means  41-60% Severe Disability Pain remains the main problem in this group, but activities of daily living are affected. These patients require a detailed investigation  61-80% Crippled Back pain impinges on all aspects of the patient's life. Positive intervention is required  81-100% Bed-bound  These patients are either bed-bound or exaggerating their symptoms  Bluford FORBES Zoe DELENA Karon DELENA, et al. Surgery versus conservative management of stable thoracolumbar fracture: the PRESTO feasibility RCT. [place] ([place]): [employer]; 2021 Nov. [facility]  Technology Assessment, No. 25.62.) Appendix 3, Oswestry Disability Index category descriptors. Available from: FindJewelers.cz  Minimally Clinically Important Difference (MCID) = 12.8%  COGNITION: Overall cognitive status: Within functional limits for tasks assessed     SENSATION: WFL  MUSCLE LENGTH: Hamstrings: Right 70 deg; Left 70 deg Thomas test: Negative Bilaterally     POSTURE: No Significant postural limitations  PALPATION: Increased tension in right lumbar paraspinal    LUMBAR ROM:   AROM eval  Flexion 100%  Extension 100%  Right lateral flexion 100%  Left lateral flexion 100%  Right rotation 100%  Left rotation 100%   (Blank rows = not tested)  LOWER EXTREMITY ROM:     Active  Right eval Left eval  Hip flexion    Hip extension    Hip abduction    Hip adduction    Hip internal rotation    Hip external rotation    Knee flexion    Knee extension    Ankle dorsiflexion    Ankle plantarflexion    Ankle inversion    Ankle eversion     (Blank rows = not tested)  LOWER EXTREMITY MMT:    MMT Right eval Left eval Right   08/28/24 Left  08/28/24  Hip flexion 4+ 4+    Hip extension 4 4 4+ 4+  Hip abduction 4 4 4+ 4-  Hip adduction 4 4- 4 4  Hip internal rotation 4 4      Hip external rotation 4 4     Knee flexion 4+ 4+  Knee extension 4+ 4+    Ankle dorsiflexion 4+ 4+    Ankle plantarflexion      Ankle inversion      Ankle eversion       (Blank rows = not tested)  UPPER EXTREMITY MMT                           MMT Right 08/14/24 Left 08/14/24  Mid Trap  4 4  Lower Trap  4- 4-  Rhomboids    4 4  Shoulder Ext   4+ 4                                   (Blank rows = not tested)  LUMBAR SPECIAL TESTS:  FABER test: Negative, Trendelenburg sign: Not tested, and FADIR Negative   FUNCTIONAL TESTS:  3 mile distance walk: NT    GAIT: Distance walked: 50 ft  Assistive device utilized: None Level of  assistance: Complete Independence Comments: No abnormalities noted  TREATMENT DATE:   08/28/24:  [facility]   Education about modifying yoga possess and to increase knee flexion with lumbar flexion to avoid hamstrings pulling on low back and to avoid leg lifts to avoid lumbar extension.   Education on stretching hamstrings before going on walks.    THEREX  Hipp MMT (See above) LLE Long Sitting Calf and Half Stretch 3 x 30-60 sec     Child's Pose with Stretch to Right  2 x 60 sec   Supine HS Curl on  Silver Ball 3 x 10   -min VC for setup of exercise with heels postioned further forward to target muscle bellies      08/21/24   THERAC   TM with 1.9 mph for 5 min   TM with 2.0 mph for  5 min at 2.0 incline   -1 mile walk    ODI:  4% (2/50)  THEREX   Bent over rows jug of water 2 x 10    Bent over rows #15 DB  2 x 10    -Pt reports increased right sided lumbar paraspinal pain.  Bent over rows with half quadruped on mat with #15 DB   1 x 10    MANUAL  Education on use of theracane for trigger point release in periscapular musculature                                                                                                               PATIENT EDUCATION:  Education details: Form and technique for correct performance of exercise and explanation about underlying deficits.  Person educated: Patient Education method: Explanation, Demonstration, Verbal cues, and Handouts Education comprehension: verbalized understanding, returned demonstration, and verbal cues required  HOME EXERCISE PROGRAM: Access Code: [email or web address] URL: [email or web address] Date: 08/21/2024 Prepared by: Toribio Servant  Exercises - Seated Cervical Sidebending Stretch  - 1 x daily - 7 x weekly - 3 reps -  30-60 sec  hold - Seated Cervical Sidebending Stretch (Mirrored)  - 1 x daily - 7 x weekly - 3 reps - 30-60 sec  hold - Standing Forward Trunk Flexion  - 1 x daily - 7 x weekly - 3 reps - 30  sec   hold - Sidelying Hip Adduction (Mirrored)  - 3-4 x weekly - 3 sets - 10 reps - Full Plank with Hip Extension - Knee Straight  - 3-4 x weekly - 3 sets - 10 reps - Deadlift with #20 Kettlebell    - 3-4 x weekly - 3 sets - 10 reps - Standing Forward-Bent Shoulder Y With Dumbbells  - 1 x daily - 3-4 x weekly - 3 sets - 10 reps - Bent Over Single Arm Shoulder Row with Dumbbell  - 3-4 x weekly - 3 sets - 10 reps - Bent Over Single Arm Shoulder Row with Dumbbell (Mirrored)  - 3-4 x weekly - 3 sets - 10 reps   ASSESSMENT:  CLINICAL IMPRESSION: Pt shows ongoing left hip deficits and decreased hamstring length on LLE. This shortened hamstring is likely placing an increased pull on pelvis that is resulting in lumbar lordosis and increased lumbar extension and provocation of discogenic symptoms or radiating pain down LLE. PT focused on stretching and strengthening exercises for left hamstring to improve shortening of left hamstring to prevent further symptoms. She was able to perform all of the exercises without an increase in pain. She will continue to benefit from skilled PT to address these aforementioned deficits to return to walking longer distances and to bending down to perform home duty and childcare tasks without being limited by pain and discomfort.         OBJECTIVE IMPAIRMENTS: decreased strength, impaired flexibility, and pain.   ACTIVITY LIMITATIONS: carrying, lifting, bending, and locomotion level  PARTICIPATION LIMITATIONS: cleaning and community activity  PERSONAL FACTORS: Age, Fitness, Time since onset of injury/illness/exacerbation are also affecting patient's functional outcome.   REHAB POTENTIAL: Good  CLINICAL DECISION MAKING: Stable/uncomplicated  EVALUATION COMPLEXITY: Low   GOALS: Goals reviewed with patient? No  SHORT TERM GOALS: Target date: 08/13/2024  Patient will demonstrate undestanding of home exercise plan by performing exercises correctly with evidence  of good carry over with min to no verbal or tactile cues .   Baseline: NT  08/05/24: Performing exercises independently  Goal status: ACHIEVED    2.  Patient will understand the proper biomechanics when bending forward to lift objects like her son with use of hip hinge instead of rounding her back to avoid re-aggravating disc herniation.     Baseline: NT 08/05/24 Instructed on how to perform hip hinge  Goal status: ACHIEVED     LONG TERM GOALS: Target date: 10/22/2024  Patient will improve modified Oswestry Disability Index (MODI) score by decreasing initial score by >=13% as evidence of the minimal statistically significant change for improvement with low back pain disability and improvement in low back function (Copay et al, 2008) Baseline: 6/50 (12%)  08/21/24: 4%  Goal status: ACHIEVED   2.  Patient will improve hip strength by 1/3 grade MMT (ie 4- to 4) for improved lumbar spine stability and to decrease incidence of pain exacerbation from disc herniation and to resume walking and lifting to care for her children and maintain her household without being limited by pain and discomfort.   Baseline: Hip Ext R/L 4/4, Hip Abd R/L 4/4 , Hip Add R/L 4/4-  08/28/24:  Hip Ext R/L 4+/4+, Hip  Abd R/L 4+/4 , Hip Add R/L 4/4 Goal status: PARTIALLY MET     3.  Patient will improve lumbar function and decrease lumbar pain with activity as evidenced by walking >=3 miles with exceeding low back pain that is >=3/10.  Baseline: 3/10   08/21/24: 0/10 NRPS after walking 1 mile at 2.0 mph   Goal status: ACHIEVED     PLAN:  PT FREQUENCY: 1-2x/week  PT DURATION: 12 weeks  PLANNED INTERVENTIONS: 97164- PT Re-evaluation, 97750- Physical Performance Testing, 97110-Therapeutic exercises, 97530- Therapeutic activity, 97112- Neuromuscular re-education, 97535- Self Care, 02859- Manual therapy, [phone]- Gait training, [phone]- Aquatic Therapy, [phone]- Electrical stimulation (unattended), [phone]- Electrical stimulation  (manual), [id number]- Traction (mechanical), 20560 (1-2 muscles), 20561 (3+ muscles)- Dry Needling, Patient/Family education, Balance training, Taping, Joint mobilization, Joint manipulation, Spinal manipulation, Spinal mobilization, Vestibular training, DME instructions, Cryotherapy, and Moist heat.  PLAN FOR NEXT SESSION:  HS length measurement on left. Stretch before doing walk to reassess goal.  Focus on manual therapy for upper traps and periscapular muscles.    Toribio Servant PT, DPT  [facility] [address], [place], [address] Phone: [phone]   Fax:  [phone]

## 2024-09-02 ENCOUNTER — Encounter: Admitting: Physical Therapy

## 2024-09-04 ENCOUNTER — Ambulatory Visit: Admitting: Physical Therapy

## 2024-09-04 DIAGNOSIS — M5459 Other low back pain: Secondary | ICD-10-CM | POA: Diagnosis not present

## 2024-09-04 DIAGNOSIS — G8929 Other chronic pain: Secondary | ICD-10-CM

## 2024-09-04 NOTE — Therapy (Signed)
 OUTPATIENT PHYSICAL THERAPY THORACOLUMBAR TREATMENT   Patient Name: Dominique Cannon MRN: [id number] DOB:[birth year], 38 y.o., female Today's Date: 09/04/2024  END OF SESSION:  PT End of Session - 09/04/24 1517     Visit Number 7    Number of Visits 24    Date for Recertification  10/22/24    Authorization Type [employer] Medicare  2025    Authorization - Visit Number 7    Authorization - Number of Visits 24    Progress Note Due on Visit 10    PT Start Time 1430    PT Stop Time 1515    PT Time Calculation (min) 45 min    Activity Tolerance Patient tolerated treatment well    Behavior During Therapy WFL for tasks assessed/performed                Past Medical History:  Diagnosis Date   BRCA2 positive    Family history of breast cancer in mother    mother age 38 died of breast cancer   Family history of ovarian cancer    Past Surgical History:  Procedure Laterality Date   DILATION AND EVACUATION N/A 01/16/2017   Procedure: DILATATION AND EVACUATION;  Surgeon: Ronal GORMAN Pinal, MD;  Location: [facility];  Service: Gynecology;  Laterality: N/A;   WISDOM TOOTH EXTRACTION     Patient Active Problem List   Diagnosis Date Noted   Acute lumbar back pain 06/20/2024   IUD (intrauterine device) in place 11/03/2023   Atypical glandular cells of undetermined significance (AGUS) on cervical Pap smear 11/03/2023   Breast cancer screening, high risk patient 03/03/2020   At high risk for breast cancer 03/03/2020   High risk of ovarian cancer 03/03/2020   History of shoulder dystocia in prior pregnancy, currently pregnant 12/16/2019   History of recurrent miscarriages 12/26/2017   BRCA2 positive 09/27/2015   Family history of breast cancer     PCP: Dr. Arthea Sharps    REFERRING PROVIDER: Dr. Arthea Sharps    REFERRING DIAG: [phone] (ICD-10-CM) - Acute bilateral low back pain with left-sided sciatica   Rationale for Evaluation and Treatment: Rehabilitation  THERAPY DIAG:  Other low back  pain  Chronic bilateral low back pain with left-sided sciatica  ONSET DATE: December 2024      SUBJECTIVE:                                                                                                                                                                                           SUBJECTIVE STATEMENT: Pt reports being able to walk for nearly 4 miles without experiencing an increase in her low  back pain.   PERTINENT HISTORY:  Pt reports that she started experiencing pain after lifting laundry basket last year, but it did go away for a while. When she is experiencing the severe pain, she  She now is experiencing increased low back pain again which started this past July. She saw a physician, who ordered MRI, which showed a disc herniation. She has had a marked improved in her symptoms over the past couple of weeks, and Dr. Claudene has suggested that she do physical therapy instead of further medical treatments. 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 She has avoided returning to yoga and bending down to lift objects out of fear of re-aggravating her back.    PAIN:  Are you having pain? Yes: NPRS scale: 0/10 currently, 3/10 (after going on walk)  Pain location: Lumbar paraspinals   Pain description: Dull   Aggravating factors: Taking longer walks    Relieving factors: Rest    PRECAUTIONS: None  RED FLAGS: None   WEIGHT BEARING RESTRICTIONS: No  FALLS:  Has patient fallen in last 6 months? 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  VITALS 108/82 HR 745   DIAGNOSTIC FINDINGS:  MR LUMBAR SPINE WITHOUT IV CONTRAST   COMPARISON: None available   CLINICAL HISTORY: Low back pain and pelvic pain.   TECHNIQUE: SAG T2, SAG T1, SAG STIR, AX T2, AX T1 without IV contrast.   FINDINGS: There is normal alignment of the lumbar spine. There is a mild broad-based bulge at L5-S1. There is no vertebral body height loss, subluxation or marrow replacing process. The sacrum and SI joints are unremarkable so far as visualized. Conus and cauda equina are unremarkable.   T12-L1: There is no focal disc protrusion, foraminal or spinal stenosis.   L1-2: There is no focal disc protrusion, foraminal or spinal stenosis.   L2-3: There is no focal disc protrusion, foraminal or spinal stenosis.   L3-4: There is no focal disc protrusion, foraminal or spinal stenosis.   L4-5: There is no focal disc protrusion, foraminal or spinal stenosis.   L5-S1: There is a moderate central left paracentral protrusion at L5-S1 crowding the descending nerve roots in the left lateral recess. No definitive impingement is identified. Small annular tear is present.   The retroperitoneal structures demonstrate no significant abnormality.   IMPRESSION: Left L5-S1 paracentral protrusion/extrusion with effacement of ventral thecal sac. There is mild abutment of the descending nerve roots in the descending left lateral recess without impingement. This is best seen on sagittal image 9 and axial image 38. No foraminal or spinal stenosis is present.   Electronically signed by: Norleen Satchel MD 07/05/2024 12:38 PM EDT RP Workstation: [id number]    PATIENT SURVEYS:  Modified Oswestry:  MODIFIED OSWESTRY DISABILITY SCALE  Date: 07/30/24 Score  Pain  intensity 0 = I  can tolerate the pain I have without having to use pain medication.  2. Personal care (washing, dressing, etc.) 0 =  I can take care of myself normally without causing increased pain.  3. Lifting 1 = I can lift heavy weights, but it causes increased pain.  4. Walking 1 = Pain prevents me from walking more than 1 mile.  5. Sitting 1 =  I can only sit in my favorite chair as long as I like.  6. Standing 1 =  I can stand as long as I want but, it increases my pain.  7. Sleeping 0 = Pain does not prevent me from sleeping well.  8. Social Life 1 =  My social life is normal, but it increases my level of pain.  9. Traveling 0 =  I can travel anywhere without increased pain.  10. Employment/ Homemaking 1 = My normal homemaking/job activities increase my pain, but I can still perform all that is required of me  Total 6/50 (12%)    Interpretation of scores: Score Category Description  0-20% Minimal Disability The patient can cope with most living activities. Usually no treatment is indicated apart from advice on lifting, sitting and exercise  21-40% Moderate Disability The patient experiences more pain and difficulty with sitting, lifting and standing. Travel and social life are more difficult and they may be disabled from work. Personal care, sexual activity and sleeping are not grossly affected, and the patient can usually be managed by conservative means  41-60% Severe Disability Pain remains the main problem in this group, but activities of daily living are affected. These patients require a detailed investigation  61-80% Crippled Back pain impinges on all aspects of the patient's life. Positive intervention is required  81-100% Bed-bound  These patients are either bed-bound or exaggerating their symptoms  Bluford FORBES Zoe DELENA Karon DELENA, et al. Surgery versus conservative management of stable thoracolumbar fracture: the PRESTO feasibility RCT. [place] ([place]): [employer];  2021 Nov. [facility] Technology Assessment, No. 25.62.) Appendix 3, Oswestry Disability Index category descriptors. Available from: FindJewelers.cz  Minimally Clinically Important Difference (MCID) = 12.8%  COGNITION: Overall cognitive status: Within functional limits for tasks assessed     SENSATION: WFL  MUSCLE LENGTH: Hamstrings: Right 70 deg; Left 70 deg Thomas test: Negative Bilaterally     POSTURE: No Significant postural limitations  PALPATION: Increased tension in right lumbar paraspinal    LUMBAR ROM:   AROM eval  Flexion 100%  Extension 100%  Right lateral flexion 100%  Left lateral flexion 100%  Right rotation 100%  Left rotation 100%   (Blank rows = not tested)  LOWER EXTREMITY ROM:     Active  Right eval Left eval  Hip flexion    Hip extension    Hip abduction    Hip adduction    Hip internal rotation    Hip external rotation    Knee flexion    Knee extension    Ankle dorsiflexion    Ankle plantarflexion    Ankle inversion    Ankle eversion     (Blank rows = not tested)  LOWER EXTREMITY MMT:    MMT Right eval Left eval Right   08/28/24 Left  08/28/24  Hip flexion 4+ 4+    Hip extension 4 4 4+ 4+  Hip abduction 4 4 4+ 4-  Hip adduction 4 4- 4 4  Hip internal rotation 4 4      Hip external rotation 4 4  Knee flexion 4+ 4+    Knee extension 4+ 4+    Ankle dorsiflexion 4+ 4+    Ankle plantarflexion      Ankle inversion      Ankle eversion       (Blank rows = not tested)  UPPER EXTREMITY MMT                           MMT Right 08/14/24 Left 08/14/24  Mid Trap  4 4  Lower Trap  4- 4-  Rhomboids    4 4  Shoulder Ext   4+ 4                                   (Blank rows = not tested)  LUMBAR SPECIAL TESTS:  FABER test: Negative, Trendelenburg sign: Not tested, and FADIR Negative   FUNCTIONAL TESTS:  3 mile distance walk: NT    GAIT: Distance walked: 50 ft  Assistive device utilized:  None Level of assistance: Complete Independence Comments: No abnormalities noted  TREATMENT DATE:   09/04/24:   THEREX  SLR HS Length R/L 82/85    -Knee slightly flexed limited tension of hamstrings   Sumo Squat with #10 KB with tap on 8 inch step 1 x 10   Sumo Squat with #15 DB with tap on 8 inch step 1 x 10   Single Leg Marches with opposite arm press up #10 KB 2 x 10  Single Leg Marches with opposite arm press up #5 DB 2 x 10    Kick Stand RDLs with tap on 1 FT platform  2 x 10   -min VC to maintain thoracic extension throughout   [employer] RDLS with #5 DB tap on 6 inch platform that is positioned 6 inches away from pt's front foot 2 x 10                                                      PATIENT EDUCATION:  Education details: Form and technique for correct performance of exercise and explanation about underlying deficits.  Person educated: Patient Education method: Explanation, Demonstration, Verbal cues, and Handouts Education comprehension: verbalized understanding, returned demonstration, and verbal cues required  HOME EXERCISE PROGRAM: Access Code: [email or web address] URL: [email or web address] Date: 08/21/2024 Prepared by: Toribio Servant  Exercises - Seated Cervical Sidebending Stretch  - 1 x daily - 7 x weekly - 3 reps - 30-60 sec  hold - Seated Cervical Sidebending Stretch (Mirrored)  - 1 x daily - 7 x weekly - 3 reps - 30-60 sec  hold - Standing Forward Trunk Flexion  - 1 x daily - 7 x weekly - 3 reps - 30 sec   hold - Sidelying Hip Adduction (Mirrored)  - 3-4 x weekly - 3 sets - 10 reps - Full Plank with Hip Extension - Knee Straight  - 3-4 x weekly - 3 sets - 10 reps - Deadlift with #20 Kettlebell    - 3-4 x weekly - 3 sets - 10 reps - Standing Forward-Bent Shoulder Y With Dumbbells  - 1 x daily - 3-4 x weekly - 3 sets - 10 reps - Bent Over Single Arm Shoulder Row with Dumbbell  - 3-4 x  weekly - 3 sets - 10 reps - Bent Over Single Arm Shoulder Row with  Dumbbell (Mirrored)  - 3-4 x weekly - 3 sets - 10 reps   ASSESSMENT:  CLINICAL IMPRESSION: Pt shows ongoing left hip deficits and decreased hamstring length on LLE. This shortened hamstring is likely placing an increased pull on pelvis that is resulting in lumbar lordosis and increased lumbar extension and provocation of discogenic symptoms or radiating pain down LLE. PT focused on stretching and strengthening exercises for left hamstring to improve shortening of left hamstring to prevent further symptoms. She was able to perform all of the exercises without an increase in pain. She will continue to benefit from skilled PT to address these aforementioned deficits to return to walking longer distances and to bending down to perform home duty and childcare tasks without being limited by pain and discomfort.         OBJECTIVE IMPAIRMENTS: decreased strength, impaired flexibility, and pain.   ACTIVITY LIMITATIONS: carrying, lifting, bending, and locomotion level  PARTICIPATION LIMITATIONS: cleaning and community activity  PERSONAL FACTORS: Age, Fitness, Time since onset of injury/illness/exacerbation are also affecting patient's functional outcome.   REHAB POTENTIAL: Good  CLINICAL DECISION MAKING: Stable/uncomplicated  EVALUATION COMPLEXITY: Low   GOALS: Goals reviewed with patient? No  SHORT TERM GOALS: Target date: 08/13/2024  Patient will demonstrate undestanding of home exercise plan by performing exercises correctly with evidence of good carry over with min to no verbal or tactile cues .   Baseline: NT  08/05/24: Performing exercises independently  Goal status: ACHIEVED    2.  Patient will understand the proper biomechanics when bending forward to lift objects like her son with use of hip hinge instead of rounding her back to avoid re-aggravating disc herniation.     Baseline: NT 08/05/24 Instructed on how to perform hip hinge  Goal status: ACHIEVED     LONG TERM GOALS: Target  date: 10/22/2024  Patient will improve modified Oswestry Disability Index (MODI) score by decreasing initial score by >=13% as evidence of the minimal statistically significant change for improvement with low back pain disability and improvement in low back function (Copay et al, 2008) Baseline: 6/50 (12%)  08/21/24: 4%  Goal status: ACHIEVED   2.  Patient will improve hip strength by 1/3 grade MMT (ie 4- to 4) for improved lumbar spine stability and to decrease incidence of pain exacerbation from disc herniation and to resume walking and lifting to care for her children and maintain her household without being limited by pain and discomfort.   Baseline: Hip Ext R/L 4/4, Hip Abd R/L 4/4 , Hip Add R/L 4/4-  08/28/24:  Hip Ext R/L 4+/4+, Hip Abd R/L 4+/4 , Hip Add R/L 4/4 Goal status: PARTIALLY MET     3.  Patient will improve lumbar function and decrease lumbar pain with activity as evidenced by walking >=3 miles with exceeding low back pain that is >=3/10.  Baseline: 3/10   08/21/24: 0/10 NRPS after walking 1 mile at 2.0 mph   Goal status: ACHIEVED     PLAN:  PT FREQUENCY: 1-2x/week  PT DURATION: 12 weeks  PLANNED INTERVENTIONS: 97164- PT Re-evaluation, 97750- Physical Performance Testing, 97110-Therapeutic exercises, 97530- Therapeutic activity, 97112- Neuromuscular re-education, 97535- Self Care, 02859- Manual therapy, [phone]- Gait training, [phone]- Aquatic Therapy, [phone]- Electrical stimulation (unattended), [phone]- Electrical stimulation (manual), [id number]- Traction (mechanical), 20560 (1-2 muscles), 20561 (3+ muscles)- Dry Needling, Patient/Family education, Balance training, Taping, Joint mobilization, Joint manipulation, Spinal manipulation, Spinal mobilization,  Vestibular training, DME instructions, Cryotherapy, and Moist heat.  PLAN FOR NEXT SESSION:  Update home exercise plan and check hip adduction strength.      Toribio Servant PT, DPT  [facility]  Clinic [address] [place], [address] Phone: [phone]   Fax:  [phone]

## 2024-09-04 NOTE — Progress Notes (Signed)
 [name] [address] [place] [address] Phone: [phone] Subjective:   Dominique Cannon, am serving as a scribe for Dr. Arthea Claudene.  I'm seeing this patient by the request  of:  Pcp, No  CC: Low back pain  YEP:Dlagzrupcz  07/08/2024 Patient's MRI does show status consistent, no abnormality noted of the ovarian system at the moment.  I think at the moment we can off the meloxicam  and start formal physical therapy which the patient see how patient responds.  The possibility of injections.  An epidural that I think would be beneficial at the L5-S1.  Follow-up with me again as needed in the 8 to 12 weeks.     Updated 09/09/2024 Dominique Cannon is a 38 y.o. female coming in with complaint of low back pain, imaging was fairly unremarkable except for an L5-S1 disc protrusion the patient has elected to try conservative therapy first including physical therapy. She is doing much better.       Past Medical History:  Diagnosis Date   BRCA2 positive    Family history of breast cancer in mother    mother age 42 died of breast cancer   Family history of ovarian cancer    Past Surgical History:  Procedure Laterality Date   DILATION AND EVACUATION N/A 01/16/2017   Procedure: DILATATION AND EVACUATION;  Surgeon: Ronal GORMAN Pinal, MD;  Location: [facility];  Service: Gynecology;  Laterality: N/A;   WISDOM TOOTH EXTRACTION     Social History   Socioeconomic History   Marital status: Married    Spouse name: Drew   Number of children: 2   Years of education: Bachelors   Highest education level: Not on file  Occupational History   Not on file  Tobacco Use   Smoking status: Never   Smokeless tobacco: Never  Vaping Use   Vaping status: Never Used  Substance and Sexual Activity   Alcohol use: Not Currently    Comment: socially barely    Drug use: No   Sexual activity: Yes    Partners: Male    Birth control/protection: I.U.D.  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      Family: [name] (2019) and [name] (2021)       Enjoys: cook and bake, being outside, gardening      Exercise: walks the dog, active with the kids   Diet: not great, breast feeding and having a hardtime      Safety   Seat belts: Yes    Guns: No   Safe in relationships: Yes    Social Drivers of Corporate Investment Banker Strain: Not on file  Food Insecurity: Not on file  Transportation Needs: Not on file  Physical Activity: Not on file  Stress: Not on file  Social Connections: Not on file   No Known Allergies Family History  Problem Relation Age of Onset   Breast cancer Mother 30   Ovarian cancer Maternal Grandmother        dx in her 59s   Breast cancer Paternal Grandmother    Cancer Maternal Uncle        not sure what type   Other Other        positive genetic testing for  the gene   Breast cancer Other        MGF's sister   Breast cancer Other        MGF's mother    Current Outpatient Medications (Endocrine & Metabolic):    PARAGARD  INTRAUTERINE COPPER  IU, by Intrauterine route.        Reviewed prior external information including notes and imaging from  primary care provider As well as notes that were available from care everywhere and other healthcare systems.  Past medical history, social, surgical and family history all reviewed in electronic medical record.  No pertanent information unless stated regarding to the chief complaint.   Review of Systems:  No headache, visual changes, nausea, vomiting, diarrhea, constipation, dizziness, abdominal pain, skin rash, fevers, chills, night sweats, weight loss, swollen lymph nodes, body aches, joint swelling, chest pain, shortness of breath, mood changes. POSITIVE muscle aches  Objective  Blood pressure 118/82,  pulse 80, height 5' 8 (1.727 m), weight 135 lb (61.2 kg), SpO2 97%.   General: No apparent distress alert and oriented x3 mood and affect normal, dressed appropriately.  HEENT: Pupils equal, extraocular movements intact  Respiratory: Patient's speak in full sentences and does not appear short of breath  Cardiovascular: No lower extremity edema, non tender, no erythema  Back exam shows loss of lordosis noted.  No tenderness noted, negative straight leg test, negative FABER test.  Able to get up from a seated position without any significant difficulty.    Impression and Recommendations:    The above documentation has been reviewed and is accurate and complete Zonya Gudger M Annya Lizana, DO

## 2024-09-09 ENCOUNTER — Ambulatory Visit: Admitting: Family Medicine

## 2024-09-09 ENCOUNTER — Encounter: Admitting: Physical Therapy

## 2024-09-09 ENCOUNTER — Encounter: Payer: Self-pay | Admitting: Family Medicine

## 2024-09-09 VITALS — BP 118/82 | HR 80 | Ht 68.0 in | Wt 135.0 lb

## 2024-09-09 DIAGNOSIS — M5442 Lumbago with sciatica, left side: Secondary | ICD-10-CM

## 2024-09-09 NOTE — Assessment & Plan Note (Signed)
 Patient is doing much better at this time.  Having no significant pain.  He is going to stop with physical therapy this week.  Home exercises, continue to monitor constipation.  Worsening pain to call us  immediately.  Discussed meloxicam  and using

## 2024-09-11 ENCOUNTER — Ambulatory Visit: Admitting: Physical Therapy

## 2024-09-11 DIAGNOSIS — M5459 Other low back pain: Secondary | ICD-10-CM

## 2024-09-11 DIAGNOSIS — G8929 Other chronic pain: Secondary | ICD-10-CM

## 2024-09-11 NOTE — Therapy (Signed)
 OUTPATIENT PHYSICAL THERAPY THORACOLUMBAR DISCHARGE    Patient Name: Dominique Cannon MRN: [id number] DOB:Apr 11, 1986, 38 y.o., female Today's Date: 09/11/2024  END OF SESSION:  PT End of Session - 09/11/24 1510     Visit Number 8    Number of Visits 24    Date for Recertification  10/22/24    Authorization Type [employer] Medicare  2025    Authorization - Visit Number 8    Authorization - Number of Visits 24    Progress Note Due on Visit 10    PT Start Time 1430    PT Stop Time 1500    PT Time Calculation (min) 30 min    Activity Tolerance Patient tolerated treatment well    Behavior During Therapy WFL for tasks assessed/performed                 Past Medical History:  Diagnosis Date   BRCA2 positive    Family history of breast cancer in mother    mother age 55 died of breast cancer   Family history of ovarian cancer    Past Surgical History:  Procedure Laterality Date   DILATION AND EVACUATION N/A 01/16/2017   Procedure: DILATATION AND EVACUATION;  Surgeon: Ronal GORMAN Pinal, MD;  Location: [facility];  Service: Gynecology;  Laterality: N/A;   WISDOM TOOTH EXTRACTION     Patient Active Problem List   Diagnosis Date Noted   Acute lumbar back pain 06/20/2024   IUD (intrauterine device) in place 11/03/2023   Atypical glandular cells of undetermined significance (AGUS) on cervical Pap smear 11/03/2023   Breast cancer screening, high risk patient 03/03/2020   At high risk for breast cancer 03/03/2020   High risk of ovarian cancer 03/03/2020   History of shoulder dystocia in prior pregnancy, currently pregnant 12/16/2019   History of recurrent miscarriages 12/26/2017   BRCA2 positive 09/27/2015   Family history of breast cancer     PCP: Dr. Arthea Sharps    REFERRING PROVIDER: Dr. Arthea Sharps    REFERRING DIAG: [phone] (ICD-10-CM) - Acute bilateral low back pain with left-sided sciatica   Rationale for Evaluation and Treatment: Rehabilitation  THERAPY DIAG:  Other low  back pain  Chronic bilateral low back pain with left-sided sciatica  ONSET DATE: December 2024      SUBJECTIVE:                                                                                                                                                                                           SUBJECTIVE STATEMENT: Patient has felt no increase in her low back pain since last session and she  has continued to perform exercises.   PERTINENT HISTORY:  Pt reports that she started experiencing pain after lifting laundry basket last year, but it did go away for a while. When she is experiencing the severe pain, she  She now is experiencing increased low back pain again which started this past July. She saw a physician, who ordered MRI, which showed a disc herniation. She has had a marked improved in her symptoms over the past couple of weeks, and Dr. Claudene has suggested that she do physical therapy instead of further medical treatments. 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 She has avoided returning to yoga and bending down to lift objects out of fear of re-aggravating her back.    PAIN:  Are you having pain? No  PRECAUTIONS: None  RED FLAGS: None   WEIGHT BEARING RESTRICTIONS: No  FALLS:  Has patient fallen in last 6 months? 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  VITALS 108/82 HR 745   DIAGNOSTIC FINDINGS:  MR LUMBAR SPINE WITHOUT IV CONTRAST   COMPARISON: None available   CLINICAL HISTORY: Low back pain and pelvic pain.   TECHNIQUE: SAG T2, SAG T1, SAG STIR, AX T2, AX T1 without IV contrast.   FINDINGS: There is normal alignment of the lumbar spine. There is a mild broad-based bulge at L5-S1. There is no vertebral body height loss, subluxation or marrow replacing process. The sacrum and SI joints are unremarkable so far as visualized. Conus and cauda equina are unremarkable.   T12-L1: There is no focal disc protrusion, foraminal or spinal stenosis.   L1-2: There is no focal disc protrusion, foraminal or spinal stenosis.   L2-3: There is no focal disc protrusion, foraminal or spinal stenosis.   L3-4: There is no focal disc protrusion, foraminal or spinal stenosis.   L4-5: There is no focal disc protrusion, foraminal or spinal stenosis.   L5-S1: There is a moderate central left paracentral protrusion at L5-S1 crowding the descending nerve roots in the left lateral recess. No definitive impingement is identified. Small annular tear is present.   The retroperitoneal structures demonstrate no significant abnormality.   IMPRESSION: Left L5-S1 paracentral protrusion/extrusion with effacement of ventral thecal sac. There is mild abutment of the descending nerve roots in the descending left lateral recess without impingement. This is best seen on sagittal image 9 and axial image 38. No foraminal or spinal stenosis is present.   Electronically signed by: Norleen Satchel MD 07/05/2024 12:38 PM EDT RP Workstation: [id number]    PATIENT SURVEYS:  Modified Oswestry:  MODIFIED OSWESTRY DISABILITY SCALE  Date: 07/30/24 Score  Pain intensity 0 = I can tolerate the pain I have without having to use pain medication.  2. Personal care (washing, dressing, etc.) 0 =  I can take care of myself normally without causing  increased pain.  3. Lifting 1 = I can lift heavy weights, but it causes increased pain.  4. Walking 1 = Pain prevents me from walking more than 1 mile.  5. Sitting 1 =  I can only sit in my favorite chair as long as I like.  6. Standing 1 =  I can stand as long as I want but, it increases my pain.  7. Sleeping 0 = Pain does not prevent me from sleeping well.  8. Social Life 1 =  My social life is normal, but it increases my level of pain.  9. Traveling 0 =  I can travel anywhere without increased pain.  10. Employment/ Homemaking 1 = My normal homemaking/job activities increase my pain, but I can still perform all that is required of me  Total 6/50 (12%)    Interpretation of scores: Score Category Description  0-20% Minimal Disability The patient can cope with most living activities. Usually no treatment is indicated apart from advice on lifting, sitting and exercise  21-40% Moderate Disability The patient experiences more pain and difficulty with sitting, lifting and standing. Travel and social life are more difficult and they may be disabled from work. Personal care, sexual activity and sleeping are not grossly affected, and the patient can usually be managed by conservative means  41-60% Severe Disability Pain remains the main problem in this group, but activities of daily living are affected. These patients require a detailed investigation  61-80% Crippled Back pain impinges on all aspects of the patient's life. Positive intervention is required  81-100% Bed-bound  These patients are either bed-bound or exaggerating their symptoms  Bluford FORBES Zoe DELENA Karon DELENA, et al. Surgery versus conservative management of stable thoracolumbar fracture: the PRESTO feasibility RCT. [place] ([place]): [employer]; 2021 Nov. [facility] Ms Technology Assessment, No. 25.62.) Appendix 3, Oswestry Disability Index category descriptors. Available from: Findjewelers.cz  Minimally  Clinically Important Difference (MCID) = 12.8%  COGNITION: Overall cognitive status: Within functional limits for tasks assessed     SENSATION: WFL  MUSCLE LENGTH: Hamstrings: Right 70 deg; Left 70 deg Thomas test: Negative Bilaterally     POSTURE: No Significant postural limitations  PALPATION: Increased tension in right lumbar paraspinal    LUMBAR ROM:   AROM eval  Flexion 100%  Extension 100%  Right lateral flexion 100%  Left lateral flexion 100%  Right rotation 100%  Left rotation 100%   (Blank rows = not tested)  LOWER EXTREMITY ROM:     Active  Right eval Left eval  Hip flexion    Hip extension    Hip abduction    Hip adduction    Hip internal rotation    Hip external rotation    Knee flexion    Knee extension    Ankle dorsiflexion    Ankle plantarflexion    Ankle inversion    Ankle eversion     (Blank rows = not tested)  LOWER EXTREMITY MMT:    MMT Right eval Left eval Right   08/28/24 Left  08/28/24  Hip flexion 4+ 4+    Hip extension 4 4 4+ 4+  Hip abduction 4 4 4+ 4-  Hip adduction 4 4- 4 4  Hip internal rotation 4 4      Hip external rotation 4 4     Knee flexion 4+ 4+    Knee extension 4+ 4+    Ankle dorsiflexion 4+ 4+    Ankle plantarflexion      Ankle  inversion      Ankle eversion       (Blank rows = not tested)  UPPER EXTREMITY MMT                           MMT Right 08/14/24 Left 08/14/24  Mid Trap  4 4  Lower Trap  4- 4-  Rhomboids    4 4  Shoulder Ext   4+ 4                                   (Blank rows = not tested)  LUMBAR SPECIAL TESTS:  FABER test: Negative, Trendelenburg sign: Not tested, and FADIR Negative   FUNCTIONAL TESTS:  3 mile distance walk: NT    GAIT: Distance walked: 50 ft  Assistive device utilized: None Level of assistance: Complete Independence Comments: No abnormalities noted  TREATMENT DATE:   09/11/24:  THEREX   Review of home exercise program with education about  progressions: Modified plank on 18 inch surface with bird dog 2 x 10  -Pt able to maintain level hips and full hip extension and shoulder flexion  Plank with bird dog- non alternating 2 x 10     -min VC to flex left knee to be able to perform with left hip extension  Education about use of ankle weight with side lying hip adduction to progress and provided patient with example of what ankle weights to purchase.  Staggered stance single leg romanian dead lifts #10 KB with tap on 6 inch step  2 x 10   -min VC to stance leg knee pointed forward and to keep extension of thoracic spine Single Leg Marches with Press Up into single leg heel raise 1 x 10                 PATIENT EDUCATION:  Education details: Form and technique for correct performance of exercise and explanation about underlying deficits.  Person educated: Patient Education method: Explanation, Demonstration, Verbal cues, and Handouts Education comprehension: verbalized understanding, returned demonstration, and verbal cues required  HOME EXERCISE PROGRAM: Access Code: [email or web address] URL: [email or web address] Date: 09/11/2024 Prepared by: Toribio Servant  Exercises - Seated Table Hamstring Stretch  - 1 x daily - 7 x weekly - 3 reps - 30-60 sec hold - Child's Pose with Sidebending (Mirrored)  - 1 x daily - 7 x weekly - 3 reps - 30-60 sec hold - Seated Cervical Sidebending Stretch  - 1 x daily - 7 x weekly - 3 reps - 30-60 sec  hold - Sidelying Hip Adduction (Mirrored)  - 3-4 x weekly - 3 sets - 10 reps - Full Plank Bird Dog-Non-Alternating   - 3-4 x weekly - 3 sets - 10 reps - Standing Forward-Bent Shoulder Y With Dumbbells  - 1 x daily - 3-4 x weekly - 3 sets - 10 reps - Bent Over Single Arm Shoulder Row with Dumbbell  - 3-4 x weekly - 3 sets - 10 reps - Kick Stand Single RDL   - 3-4 x weekly - 3 sets - 10 reps - Sumo Squat with Dumbbell  - 3-4 x weekly - 3 sets - 10 reps - Standing March with Alternating Shoulder  Press   - 3-4 x weekly - 3 sets - 10 reps  ASSESSMENT:  CLINICAL IMPRESSION: Pt has now met all rehab goals and she has now consistently  experienced a decrease in her right sided low back pain even with increased activity. PT focused on educating patient on home exercise plan progressions so that she can continue to perform independently to maintain decreased pain and improved abdominal and hip strength. She is now ready for discharge.     OBJECTIVE IMPAIRMENTS: decreased strength, impaired flexibility, and pain.   ACTIVITY LIMITATIONS: carrying, lifting, bending, and locomotion level  PARTICIPATION LIMITATIONS: cleaning and community activity  PERSONAL FACTORS: Age, Fitness, Time since onset of injury/illness/exacerbation are also affecting patient's functional outcome.   REHAB POTENTIAL: Good  CLINICAL DECISION MAKING: Stable/uncomplicated  EVALUATION COMPLEXITY: Low   GOALS: Goals reviewed with patient? No  SHORT TERM GOALS: Target date: 08/13/2024  Patient will demonstrate undestanding of home exercise plan by performing exercises correctly with evidence of good carry over with min to no verbal or tactile cues .   Baseline: NT  08/05/24: Performing exercises independently  Goal status: ACHIEVED    2.  Patient will understand the proper biomechanics when bending forward to lift objects like her son with use of hip hinge instead of rounding her back to avoid re-aggravating disc herniation.     Baseline: NT 08/05/24 Instructed on how to perform hip hinge  Goal status: ACHIEVED     LONG TERM GOALS: Target date: 10/22/2024  Patient will improve modified Oswestry Disability Index (MODI) score by decreasing initial score by >=13% as evidence of the minimal statistically significant change for improvement with low back pain disability and improvement in low back function (Copay et al, 2008) Baseline: 6/50 (12%)  08/21/24: 4%  Goal status: ACHIEVED   2.  Patient will improve hip  strength by 1/3 grade MMT (ie 4- to 4) for improved lumbar spine stability and to decrease incidence of pain exacerbation from disc herniation and to resume walking and lifting to care for her children and maintain her household without being limited by pain and discomfort.   Baseline: Hip Ext R/L 4/4, Hip Abd R/L 4/4 , Hip Add R/L 4/4-  08/28/24:  Hip Ext R/L 4+/4+, Hip Abd R/L 4+/4 , Hip Add R/L 4/4 Goal status: NOT MET    3.  Patient will improve lumbar function and decrease lumbar pain with activity as evidenced by walking >=3 miles with exceeding low back pain that is >=3/10.  Baseline: 3/10   08/21/24: 0/10 NRPS after walking 1 mile at 2.0 mph   Goal status: ACHIEVED     PLAN:  PT FREQUENCY: 1-2x/week  PT DURATION: 12 weeks  PLANNED INTERVENTIONS: 97164- PT Re-evaluation, 97750- Physical Performance Testing, 97110-Therapeutic exercises, 97530- Therapeutic activity, 97112- Neuromuscular re-education, 97535- Self Care, 02859- Manual therapy, [phone]- Gait training, [phone]- Aquatic Therapy, [phone]- Electrical stimulation (unattended), [phone]- Electrical stimulation (manual), [id number]- Traction (mechanical), 20560 (1-2 muscles), 20561 (3+ muscles)- Dry Needling, Patient/Family education, Balance training, Taping, Joint mobilization, Joint manipulation, Spinal manipulation, Spinal mobilization, Vestibular training, DME instructions, Cryotherapy, and Moist heat.  PLAN FOR NEXT SESSION: Discharge from PT    Toribio Servant PT, DPT  [facility] [address] [place], [address] Phone: [phone]   Fax:  [phone]

## 2024-09-16 ENCOUNTER — Encounter: Admitting: Physical Therapy

## 2024-09-18 ENCOUNTER — Encounter: Admitting: Physical Therapy

## 2024-09-22 ENCOUNTER — Encounter: Admitting: Physical Therapy

## 2024-09-24 ENCOUNTER — Encounter: Admitting: Physical Therapy

## 2024-09-29 ENCOUNTER — Encounter: Admitting: Physical Therapy

## 2024-10-01 ENCOUNTER — Encounter: Admitting: Physical Therapy

## 2024-10-01 ENCOUNTER — Other Ambulatory Visit (HOSPITAL_BASED_OUTPATIENT_CLINIC_OR_DEPARTMENT_OTHER): Payer: Self-pay | Admitting: Obstetrics & Gynecology

## 2024-10-01 DIAGNOSIS — Z1507 Genetic susceptibility to malignant neoplasm of urinary tract: Secondary | ICD-10-CM

## 2024-10-06 ENCOUNTER — Encounter: Admitting: Physical Therapy

## 2024-10-07 ENCOUNTER — Ambulatory Visit (HOSPITAL_BASED_OUTPATIENT_CLINIC_OR_DEPARTMENT_OTHER): Payer: Self-pay | Admitting: Obstetrics & Gynecology

## 2024-10-07 LAB — CA 125: Cancer Antigen (CA) 125: 27.5 U/mL (ref 0.0–38.1)

## 2024-10-24 ENCOUNTER — Ambulatory Visit: Payer: 59 | Admitting: Hematology and Oncology

## 2024-10-24 ENCOUNTER — Other Ambulatory Visit: Payer: 59

## 2024-11-19 ENCOUNTER — Ambulatory Visit (INDEPENDENT_AMBULATORY_CARE_PROVIDER_SITE_OTHER): Admitting: Obstetrics & Gynecology

## 2024-11-19 ENCOUNTER — Encounter (HOSPITAL_BASED_OUTPATIENT_CLINIC_OR_DEPARTMENT_OTHER): Payer: Self-pay | Admitting: Obstetrics & Gynecology

## 2024-11-19 VITALS — BP 122/83 | HR 97 | Ht 68.0 in | Wt 134.6 lb

## 2024-11-19 DIAGNOSIS — Z1502 Genetic susceptibility to malignant neoplasm of ovary: Secondary | ICD-10-CM

## 2024-11-19 DIAGNOSIS — Z975 Presence of (intrauterine) contraceptive device: Secondary | ICD-10-CM | POA: Diagnosis not present

## 2024-11-19 DIAGNOSIS — Z803 Family history of malignant neoplasm of breast: Secondary | ICD-10-CM

## 2024-11-19 DIAGNOSIS — Z1331 Encounter for screening for depression: Secondary | ICD-10-CM

## 2024-11-19 DIAGNOSIS — Z01419 Encounter for gynecological examination (general) (routine) without abnormal findings: Secondary | ICD-10-CM | POA: Diagnosis not present

## 2024-11-19 DIAGNOSIS — Z1589 Genetic susceptibility to other disease: Secondary | ICD-10-CM

## 2024-11-19 DIAGNOSIS — Z1501 Genetic susceptibility to malignant neoplasm of breast: Secondary | ICD-10-CM

## 2024-11-19 NOTE — Progress Notes (Signed)
 "  ANNUAL EXAM Patient name: Dominique Cannon MRN [id number]  Date of birth: 1986-05-08 Chief Complaint:   Annual Exam  History of Present Illness:   Dominique Cannon is a 39 y.o. [phone] Caucasian female being seen today for a routine annual exam.  Has breast MRI scheduled on her breast end of the month.  Cycles are regular.  Has IUD.  H/o BRCA 2.  She has seen Dr. Ebbie to discuss mastectomy.  Will need to see plastic surgeon, Dr. Arelia.  We discussed timing as well.    Did have mildly elevated ca-125 last year.  Was having significant lower back issues at that time.  She has seen Dr. Claudene and is much, much improved.  Ca-125 improved as back pain improved. Did have ultrasound 05/23/2024 and then lumbar spine MR and pelvic MR 8/22.  Then has CT abd/pelvic 08/12/2024.  Feel should repeat imaging and ca-125 about 6 months form last imaging.    Patient's last menstrual period was 10/23/2024 (exact date).    Last pap 10/31/2023. Results were: NILM w/ HRHPV negative. H/O abnormal pap: yes Last mammogram: 04/24/2024. Results were: normal. Bilateral breast MRI W WO contrast scheduled for 12/03/2024. Family h/o breast cancer: yes mother Last colonoscopy: N/A. Results were: N/A. Family h/o colorectal cancer: no     11/19/2024    3:31 PM 11/19/2024    3:29 PM 02/15/2022    9:14 AM 10/04/2021    1:44 PM 09/06/2021    8:17 AM  Depression screen PHQ 2/9  Decreased Interest 0 0 0 0 0  Down, Depressed, Hopeless 0 0 0 0 0  PHQ - 2 Score 0 0 0 0 0  Altered sleeping 0      Tired, decreased energy 0      Change in appetite 0      Feeling bad or failure about yourself  0      Trouble concentrating 0      Moving slowly or fidgety/restless 0      Suicidal thoughts 0      PHQ-9 Score 0      Difficult doing work/chores Not difficult at all            11/19/2024    3:31 PM  GAD 7 : Generalized Anxiety Score  Nervous, Anxious, on Edge 0  Control/stop worrying 0  Worry too much - different things 0   Trouble relaxing 0  Restless 0  Easily annoyed or irritable 1  Afraid - awful might happen 0  Total GAD 7 Score 1  Anxiety Difficulty Not difficult at all     Review of Systems:   Pertinent items are noted in HPI Denies any bladder changes.  Having some increased constipation.  Denies pelvic pain. Pertinent History Reviewed:  Reviewed past medical,surgical, social and family history.  Reviewed problem list, medications and allergies. Physical Assessment:   Vitals:   11/19/24 1526  BP: 122/83  Pulse: 97  SpO2: 100%  Weight: 134 lb 9.6 oz (61.1 kg)  Height: 5' 8 (1.727 m)  Body mass index is 20.47 kg/m.        Physical Examination:   General appearance - well appearing, and in no distress  Mental status - alert, oriented to person, place, and time  Psych:  She has a normal mood and affect  Skin - warm and dry, normal color, no suspicious lesions noted  Chest - effort normal, all lung fields clear to auscultation bilaterally  Heart - normal rate and  regular rhythm  Neck:  midline trachea, no thyromegaly or nodules  Breasts - breasts appear normal, no suspicious masses, no skin or nipple changes or  axillary nodes  Abdomen - soft, nontender, nondistended, no masses or organomegaly  Pelvic - VULVA: normal appearing vulva with no masses, tenderness or lesions   VAGINA: normal appearing vagina with normal color and discharge, no lesions   CERVIX: normal appearing cervix without discharge or lesions, no CMT  Thin prep pap is not indicated   UTERUS: uterus is felt to be normal size, shape, consistency and nontender   ADNEXA: No adnexal masses or tenderness noted.  Rectal - deferred   Extremities:  No swelling or varicosities noted  Chaperone present for exam  No results found for this or any previous visit (from the past 24 hours).  Assessment & Plan:  1. Well woman exam with routine gynecological exam (Primary) - Pap smear 10/31/2023 - Mammogram 04/28/2024.  Breast MRI  scheduled 12/03/2024 - Colonoscopy guidelines reviewed - lab work done with PCP, Dr.  GLENWOOD vaccines reviewed/updated  2. BRCA2 gene mutation positive - will return for ca-125 and ultrasound - US  PELVIS TRANSVAGINAL NON-OB (TV ONLY); Future  3. Family history of breast cancer  4. IUD (intrauterine device) in place - paragard  placed 03/10/2020.   Orders Placed This Encounter  Procedures   US  PELVIS TRANSVAGINAL NON-OB (TV ONLY)    Meds: No orders of the defined types were placed in this encounter.   Follow-up: ~3 months for ultrasound and ca-125  Ronal GORMAN Pinal, MD 11/22/2024 10:44 PM "

## 2024-12-03 ENCOUNTER — Ambulatory Visit
Admission: RE | Admit: 2024-12-03 | Discharge: 2024-12-03 | Disposition: A | Source: Ambulatory Visit | Attending: Hematology and Oncology

## 2024-12-03 DIAGNOSIS — Z9189 Other specified personal risk factors, not elsewhere classified: Secondary | ICD-10-CM

## 2024-12-03 DIAGNOSIS — Z1507 Genetic susceptibility to malignant neoplasm of urinary tract: Secondary | ICD-10-CM

## 2024-12-03 MED ORDER — GADOPICLENOL 0.5 MMOL/ML IV SOLN
6.0000 mL | Freq: Once | INTRAVENOUS | Status: AC | PRN
  Administered 2024-12-03: 6 mL via INTRAVENOUS

## 2024-12-05 ENCOUNTER — Other Ambulatory Visit: Payer: Self-pay | Admitting: Hematology and Oncology

## 2024-12-05 DIAGNOSIS — R9389 Abnormal findings on diagnostic imaging of other specified body structures: Secondary | ICD-10-CM

## 2024-12-15 ENCOUNTER — Other Ambulatory Visit

## 2024-12-15 ENCOUNTER — Encounter

## 2024-12-15 ENCOUNTER — Inpatient Hospital Stay: Admission: RE | Admit: 2024-12-15 | Source: Ambulatory Visit

## 2024-12-18 ENCOUNTER — Inpatient Hospital Stay: Admission: RE | Admit: 2024-12-18 | Discharge: 2024-12-18 | Attending: Hematology and Oncology

## 2024-12-18 DIAGNOSIS — R9389 Abnormal findings on diagnostic imaging of other specified body structures: Secondary | ICD-10-CM

## 2024-12-18 MED ORDER — GADOPICLENOL 0.5 MMOL/ML IV SOLN
6.0000 mL | Freq: Once | INTRAVENOUS | Status: AC | PRN
  Administered 2024-12-18: 6 mL via INTRAVENOUS

## 2024-12-19 ENCOUNTER — Ambulatory Visit: Payer: Self-pay | Admitting: Hematology and Oncology

## 2024-12-19 LAB — SURGICAL PATHOLOGY

## 2024-12-19 NOTE — Telephone Encounter (Signed)
-----   Message from Amber Stalls, MD sent at 12/19/2024  1:47 PM EST ----- Breast biopsies came back benign. Please let the pt know.  Thanks,

## 2024-12-19 NOTE — Telephone Encounter (Signed)
 Per MD RN called pt w/ results of breast biopsy. Pt verbalized understanding.

## 2025-01-13 ENCOUNTER — Ambulatory Visit: Admitting: Hematology and Oncology

## 2025-02-18 ENCOUNTER — Other Ambulatory Visit (HOSPITAL_BASED_OUTPATIENT_CLINIC_OR_DEPARTMENT_OTHER)

## 2025-02-18 ENCOUNTER — Other Ambulatory Visit (HOSPITAL_BASED_OUTPATIENT_CLINIC_OR_DEPARTMENT_OTHER): Payer: Self-pay | Admitting: Obstetrics & Gynecology

## 2025-08-14 ENCOUNTER — Ambulatory Visit: Admitting: Hematology and Oncology
